# Patient Record
Sex: Female | Born: 1975 | Hispanic: No | Marital: Single | Smoking: Never smoker
Health system: Southern US, Community
[De-identification: ages and names within clinical notes are randomized; demographics above are authoritative.]

## PROBLEM LIST (undated history)

## (undated) ENCOUNTER — Inpatient Hospital Stay (HOSPITAL_COMMUNITY): Payer: Self-pay

## (undated) DIAGNOSIS — R42 Dizziness and giddiness: Secondary | ICD-10-CM

## (undated) DIAGNOSIS — F32A Depression, unspecified: Secondary | ICD-10-CM

## (undated) DIAGNOSIS — K5900 Constipation, unspecified: Secondary | ICD-10-CM

## (undated) DIAGNOSIS — E119 Type 2 diabetes mellitus without complications: Secondary | ICD-10-CM

## (undated) DIAGNOSIS — R011 Cardiac murmur, unspecified: Secondary | ICD-10-CM

## (undated) DIAGNOSIS — Z8042 Family history of malignant neoplasm of prostate: Secondary | ICD-10-CM

## (undated) DIAGNOSIS — F4310 Post-traumatic stress disorder, unspecified: Secondary | ICD-10-CM

## (undated) DIAGNOSIS — D649 Anemia, unspecified: Secondary | ICD-10-CM

## (undated) DIAGNOSIS — Z803 Family history of malignant neoplasm of breast: Secondary | ICD-10-CM

## (undated) DIAGNOSIS — K219 Gastro-esophageal reflux disease without esophagitis: Secondary | ICD-10-CM

## (undated) DIAGNOSIS — E7800 Pure hypercholesterolemia, unspecified: Secondary | ICD-10-CM

## (undated) DIAGNOSIS — I10 Essential (primary) hypertension: Secondary | ICD-10-CM

## (undated) DIAGNOSIS — T7840XA Allergy, unspecified, initial encounter: Secondary | ICD-10-CM

## (undated) HISTORY — DX: Family history of malignant neoplasm of breast: Z80.3

## (undated) HISTORY — DX: Gastro-esophageal reflux disease without esophagitis: K21.9

## (undated) HISTORY — DX: Family history of malignant neoplasm of prostate: Z80.42

## (undated) HISTORY — DX: Constipation, unspecified: K59.00

## (undated) HISTORY — DX: Allergy, unspecified, initial encounter: T78.40XA

## (undated) HISTORY — DX: Post-traumatic stress disorder, unspecified: F43.10

## (undated) HISTORY — DX: Depression, unspecified: F32.A

## (undated) HISTORY — DX: Type 2 diabetes mellitus without complications: E11.9

## (undated) HISTORY — PX: NO PAST SURGERIES: SHX2092

## (undated) HISTORY — DX: Anemia, unspecified: D64.9

---

## 2007-05-23 ENCOUNTER — Ambulatory Visit: Payer: Self-pay | Admitting: Obstetrics and Gynecology

## 2007-05-23 ENCOUNTER — Inpatient Hospital Stay (HOSPITAL_COMMUNITY): Admission: AD | Admit: 2007-05-23 | Discharge: 2007-05-24 | Payer: Self-pay | Admitting: Obstetrics and Gynecology

## 2007-06-27 ENCOUNTER — Emergency Department (HOSPITAL_COMMUNITY): Admission: EM | Admit: 2007-06-27 | Discharge: 2007-06-27 | Payer: Self-pay | Admitting: Emergency Medicine

## 2014-04-26 ENCOUNTER — Emergency Department (HOSPITAL_COMMUNITY)
Admission: EM | Admit: 2014-04-26 | Discharge: 2014-04-26 | Disposition: A | Discharge status: Home / Self Care | Payer: Medicaid - Out of State | Attending: Emergency Medicine | Admitting: Emergency Medicine

## 2014-04-26 ENCOUNTER — Encounter (HOSPITAL_COMMUNITY): Payer: Self-pay

## 2014-04-26 DIAGNOSIS — Z91040 Latex allergy status: Secondary | ICD-10-CM | POA: Diagnosis not present

## 2014-04-26 DIAGNOSIS — I10 Essential (primary) hypertension: Secondary | ICD-10-CM | POA: Insufficient documentation

## 2014-04-26 DIAGNOSIS — E780 Pure hypercholesterolemia: Secondary | ICD-10-CM | POA: Insufficient documentation

## 2014-04-26 DIAGNOSIS — R011 Cardiac murmur, unspecified: Secondary | ICD-10-CM | POA: Insufficient documentation

## 2014-04-26 DIAGNOSIS — Z760 Encounter for issue of repeat prescription: Secondary | ICD-10-CM | POA: Diagnosis not present

## 2014-04-26 DIAGNOSIS — R42 Dizziness and giddiness: Secondary | ICD-10-CM | POA: Diagnosis not present

## 2014-04-26 DIAGNOSIS — Z79899 Other long term (current) drug therapy: Secondary | ICD-10-CM | POA: Insufficient documentation

## 2014-04-26 HISTORY — DX: Cardiac murmur, unspecified: R01.1

## 2014-04-26 HISTORY — DX: Dizziness and giddiness: R42

## 2014-04-26 HISTORY — DX: Essential (primary) hypertension: I10

## 2014-04-26 HISTORY — DX: Pure hypercholesterolemia, unspecified: E78.00

## 2014-04-26 MED ORDER — AMLODIPINE BESYLATE 5 MG PO TABS: 5.0000 mg | ORAL_TABLET | Freq: Every day | ORAL | Status: DC

## 2014-04-26 MED ORDER — MECLIZINE HCL 25 MG PO TABS
25.0000 mg | ORAL_TABLET | Freq: Once | ORAL | Status: AC
  Administered 2014-04-26: 25 mg via ORAL
  Filled 2014-04-26: qty 1

## 2014-04-26 MED ORDER — MECLIZINE HCL 25 MG PO TABS: 25.0000 mg | ORAL_TABLET | Freq: Three times a day (TID) | ORAL | Status: DC | PRN

## 2014-04-26 NOTE — ED Notes (Signed)
Wanda from [facility] at bedside.

## 2014-04-26 NOTE — ED Provider Notes (Signed)
CSN: [id number]     Arrival date & time 04/26/14  1241 History   First MD Initiated Contact with Patient 04/26/14 1305     Chief Complaint  Patient presents with  . Dizziness     (Consider location/radiation/quality/duration/timing/severity/associated sxs/prior Treatment) HPI Pt is a 39yo female with hx of vertigo, HTN, elevated cholesterol and heart murmur, presenting to ED with c/o an episode of vertigo with dizziness described as room spinning, mild generalized headache, and associated nausea that lasted about 30-40 minutes.  Pt states symptoms started all of a sudden and gradually improved with rest but states she normally has vertigo when her BP is high. Symptoms today felt similar to previous episodes of vertigo.  She does not have meclizine at home for vertigo.  Pt states she moved from [place] on 03/12/14, ran out of her BP medication 3 days ago, norvasc "lowest dose" once daily.  Pt states upon arrival to ED dizziness and headache have subsided but still c/o mild lightheadedness.  No fever, chills, vomiting, diarrhea, chest pain or SOB. Denies numbness or weakness in arms or legs.   Past Medical History  Diagnosis Date  . Vertigo   . Hypertension   . Elevated cholesterol   . Heart murmur    History reviewed. No pertinent past surgical history. History reviewed. No pertinent family history. History  Substance Use Topics  . Smoking status: Never Smoker   . Smokeless tobacco: Not on file  . Alcohol Use: No   OB History    No data available     Review of Systems  Constitutional: Negative for fever, chills, diaphoresis, appetite change and fatigue.  Respiratory: Negative for cough and shortness of breath.   Cardiovascular: Negative for chest pain and palpitations.  Gastrointestinal: Positive for nausea. Negative for vomiting, abdominal pain, diarrhea and constipation.  Neurological: Positive for dizziness, light-headedness and headaches. Negative for tremors, seizures,  syncope, facial asymmetry, speech difficulty, weakness and numbness.  All other systems reviewed and are negative.     Allergies  Latex  Home Medications   Prior to Admission medications   Medication Sig Start Date End Date Taking? Authorizing Provider  atorvastatin (LIPITOR) 10 MG tablet Take 10 mg by mouth daily.   Yes Historical Provider, MD  amLODipine (NORVASC) 5 MG tablet Take 1 tablet (5 mg total) by mouth daily. 04/26/14   Noland Fordyce, PA-C  meclizine (ANTIVERT) 25 MG tablet Take 1 tablet (25 mg total) by mouth 3 (three) times daily as needed for dizziness. 04/26/14   Noland Fordyce, PA-C   BP 126/71 mmHg  Pulse 59  Temp(Src) 98.5 F (36.9 C) (Oral)  Resp 17  Ht 5\' 9"  (1.753 m)  Wt 204 lb (92.534 kg)  BMI 30.11 kg/m2  SpO2 100%  LMP 04/03/2014 Physical Exam  Constitutional: She is oriented to person, place, and time. She appears well-developed and well-nourished. No distress.  Pt lying comfortably in exam bed watching television, NAD.  HENT:  Head: Normocephalic and atraumatic.  Eyes: Conjunctivae and EOM are normal. Pupils are equal, round, and reactive to light. No scleral icterus.  Neck: Normal range of motion. Neck supple.  Cardiovascular: Normal rate, regular rhythm and normal heart sounds.   Pulmonary/Chest: Effort normal and breath sounds normal. No respiratory distress. She has no wheezes. She has no rales. She exhibits no tenderness.  Abdominal: Soft. Bowel sounds are normal. She exhibits no distension and no mass. There is no tenderness. There is no rebound and no guarding.  Musculoskeletal:  Normal range of motion.  Neurological: She is alert and oriented to person, place, and time. She has normal strength. No cranial nerve deficit or sensory deficit. Coordination and gait normal. GCS eye subscore is 4. GCS verbal subscore is 5. GCS motor subscore is 6.  Skin: Skin is warm and dry. She is not diaphoretic.  Nursing note and vitals reviewed.   ED Course   Procedures (including critical care time) Labs Review Labs Reviewed - No data to display  Imaging Review No results found.   EKG Interpretation None      MDM   Final diagnoses:  Vertigo  Medication refill    Pt is a 39yo female with hx of vertigo and HTN, presenting to ED with c/o episode of vertigo lasting 30-40 minutes. Pt has been off BP medication for 3 days due to recent move and does not have a PCP at this time. Pt c/o mild lightheadedness, but symptoms have otherwise resolved. Doubt CVA, SAH, or other emergent process taking place at this time.  Will get orthostatic vital signs. BP was WNL upon arrival to ED.  Meclizine given in ED.  Case Management consulted to help pt establish care with a PCP for ongoing healthcare needs including medication refills.  Pt states she does not need a refill on her cholesterol medication. Will prescribe 1 month of norvasc but strongly encouraged pt to f/u with [facility] for PCP. [employer] guide provided. Return precautions provided. Pt verbalized understanding and agreement with tx plan.     Noland Fordyce, PA-C 04/26/14 [place], MD 04/28/14 [phone]

## 2014-04-26 NOTE — ED Notes (Signed)
Pt moved here 03-12-14 from [place].  Has been out of BP meds x 3 days.  Pt had vertigo this morning that lasted 45 minutes.  Pt reports that when she has this BP is usually high.  Pt feels "little lightheaded" now.  No other s/s noted.

## 2014-04-26 NOTE — Discharge Instructions (Signed)
Benign Positional Vertigo Vertigo means you feel like you or your surroundings are moving when they are not. Benign positional vertigo is the most common form of vertigo. Benign means that the cause of your condition is not serious. Benign positional vertigo is more common in older adults. CAUSES  Benign positional vertigo is the result of an upset in the labyrinth system. This is an area in the middle ear that helps control your balance. This may be caused by a viral infection, head injury, or repetitive motion. However, often no specific cause is found. SYMPTOMS  Symptoms of benign positional vertigo occur when you move your head or eyes in different directions. Some of the symptoms may include:  Loss of balance and falls.  Vomiting.  Blurred vision.  Dizziness.  Nausea.  Involuntary eye movements (nystagmus). DIAGNOSIS  Benign positional vertigo is usually diagnosed by physical exam. If the specific cause of your benign positional vertigo is unknown, your caregiver may perform imaging tests, such as magnetic resonance imaging (MRI) or computed tomography (CT). TREATMENT  Your caregiver may recommend movements or procedures to correct the benign positional vertigo. Medicines such as meclizine, benzodiazepines, and medicines for nausea may be used to treat your symptoms. In rare cases, if your symptoms are caused by certain conditions that affect the inner ear, you may need surgery. HOME CARE INSTRUCTIONS   Follow your caregiver's instructions.  Move slowly. Do not make sudden body or head movements.  Avoid driving.  Avoid operating heavy machinery.  Avoid performing any tasks that would be dangerous to you or others during a vertigo episode.  Drink enough fluids to keep your urine clear or pale yellow. SEEK IMMEDIATE MEDICAL CARE IF:   You develop problems with walking, weakness, numbness, or using your arms, hands, or legs.  You have difficulty speaking.  You develop  severe headaches.  Your nausea or vomiting continues or gets worse.  You develop visual changes.  Your family or friends notice any behavioral changes.  Your condition gets worse.  You have a fever.  You develop a stiff neck or sensitivity to light. MAKE SURE YOU:   Understand these instructions.  Will watch your condition.  Will get help right away if you are not doing well or get worse. Document Released: 10/04/2005 Document Revised: 03/21/2011 Document Reviewed: 09/16/2010 [employer] Patient Information 2015 [employer]. This information is not intended to replace advice given to you by your health care provider. Make sure you discuss any questions you have with your health care provider.    

## 2014-06-25 ENCOUNTER — Ambulatory Visit: Payer: PRIVATE HEALTH INSURANCE

## 2014-09-23 ENCOUNTER — Emergency Department (HOSPITAL_COMMUNITY)
Admission: EM | Admit: 2014-09-23 | Discharge: 2014-09-23 | Disposition: A | Discharge status: Home / Self Care | Payer: Medicaid Other | Attending: Emergency Medicine | Admitting: Emergency Medicine

## 2014-09-23 ENCOUNTER — Emergency Department (HOSPITAL_COMMUNITY): Payer: Medicaid Other

## 2014-09-23 ENCOUNTER — Encounter (HOSPITAL_COMMUNITY): Payer: Self-pay | Admitting: Emergency Medicine

## 2014-09-23 DIAGNOSIS — I10 Essential (primary) hypertension: Secondary | ICD-10-CM | POA: Insufficient documentation

## 2014-09-23 DIAGNOSIS — O99411 Diseases of the circulatory system complicating pregnancy, first trimester: Secondary | ICD-10-CM | POA: Diagnosis not present

## 2014-09-23 DIAGNOSIS — R011 Cardiac murmur, unspecified: Secondary | ICD-10-CM | POA: Diagnosis not present

## 2014-09-23 DIAGNOSIS — Z91040 Latex allergy status: Secondary | ICD-10-CM | POA: Diagnosis not present

## 2014-09-23 DIAGNOSIS — Z3A08 8 weeks gestation of pregnancy: Secondary | ICD-10-CM | POA: Insufficient documentation

## 2014-09-23 DIAGNOSIS — R109 Unspecified abdominal pain: Secondary | ICD-10-CM | POA: Insufficient documentation

## 2014-09-23 DIAGNOSIS — Z8659 Personal history of other mental and behavioral disorders: Secondary | ICD-10-CM | POA: Insufficient documentation

## 2014-09-23 DIAGNOSIS — N939 Abnormal uterine and vaginal bleeding, unspecified: Secondary | ICD-10-CM

## 2014-09-23 DIAGNOSIS — O209 Hemorrhage in early pregnancy, unspecified: Secondary | ICD-10-CM | POA: Diagnosis present

## 2014-09-23 DIAGNOSIS — O9989 Other specified diseases and conditions complicating pregnancy, childbirth and the puerperium: Secondary | ICD-10-CM | POA: Insufficient documentation

## 2014-09-23 LAB — WET PREP, GENITAL
TRICH WET PREP: NONE SEEN
Yeast Wet Prep HPF POC: NONE SEEN

## 2014-09-23 LAB — COMPREHENSIVE METABOLIC PANEL
ALT: 11 U/L — AB (ref 14–54)
AST: 17 U/L (ref 15–41)
Albumin: 3.6 g/dL (ref 3.5–5.0)
Alkaline Phosphatase: 49 U/L (ref 38–126)
Anion gap: 10 (ref 5–15)
BILIRUBIN TOTAL: 0.5 mg/dL (ref 0.3–1.2)
BUN: 8 mg/dL (ref 6–20)
CHLORIDE: 103 mmol/L (ref 101–111)
CO2: 22 mmol/L (ref 22–32)
CREATININE: 0.71 mg/dL (ref 0.44–1.00)
Calcium: 9.2 mg/dL (ref 8.9–10.3)
Glucose, Bld: 112 mg/dL — ABNORMAL HIGH (ref 65–99)
Potassium: 3.7 mmol/L (ref 3.5–5.1)
Sodium: 135 mmol/L (ref 135–145)
TOTAL PROTEIN: 6.4 g/dL — AB (ref 6.5–8.1)

## 2014-09-23 LAB — CBC WITH DIFFERENTIAL/PLATELET
BASOS ABS: 0 10*3/uL (ref 0.0–0.1)
Basophils Relative: 0 % (ref 0–1)
EOS PCT: 3 % (ref 0–5)
Eosinophils Absolute: 0.3 10*3/uL (ref 0.0–0.7)
HEMATOCRIT: 35.5 % — AB (ref 36.0–46.0)
Hemoglobin: 12.2 g/dL (ref 12.0–15.0)
LYMPHS ABS: 4.2 10*3/uL — AB (ref 0.7–4.0)
LYMPHS PCT: 46 % (ref 12–46)
MCH: 29.1 pg (ref 26.0–34.0)
MCHC: 34.4 g/dL (ref 30.0–36.0)
MCV: 84.7 fL (ref 78.0–100.0)
MONO ABS: 0.6 10*3/uL (ref 0.1–1.0)
Monocytes Relative: 7 % (ref 3–12)
NEUTROS ABS: 4 10*3/uL (ref 1.7–7.7)
Neutrophils Relative %: 44 % (ref 43–77)
PLATELETS: 207 10*3/uL (ref 150–400)
RBC: 4.19 MIL/uL (ref 3.87–5.11)
RDW: 13.4 % (ref 11.5–15.5)
WBC: 9.1 10*3/uL (ref 4.0–10.5)

## 2014-09-23 LAB — URINALYSIS, ROUTINE W REFLEX MICROSCOPIC
BILIRUBIN URINE: NEGATIVE
GLUCOSE, UA: NEGATIVE mg/dL
KETONES UR: NEGATIVE mg/dL
Leukocytes, UA: NEGATIVE
Nitrite: NEGATIVE
PROTEIN: NEGATIVE mg/dL
Specific Gravity, Urine: 1.003 — ABNORMAL LOW (ref 1.005–1.030)
UROBILINOGEN UA: 0.2 mg/dL (ref 0.0–1.0)
pH: 6.5 (ref 5.0–8.0)

## 2014-09-23 LAB — URINE MICROSCOPIC-ADD ON

## 2014-09-23 LAB — GC/CHLAMYDIA PROBE AMP (~~LOC~~) NOT AT ARMC
CHLAMYDIA, DNA PROBE: NEGATIVE
NEISSERIA GONORRHEA: NEGATIVE

## 2014-09-23 LAB — POC URINE PREG, ED: PREG TEST UR: POSITIVE — AB

## 2014-09-23 NOTE — ED Notes (Signed)
Patient transported to Ultrasound 

## 2014-09-23 NOTE — ED Notes (Signed)
Pt is pregnant and reports bleeding x 2 episodes today and on the 8th. No pain noted.

## 2014-09-23 NOTE — ED Provider Notes (Signed)
CSN: [id number]     Arrival date & time 09/23/14  0057 History   ,This chart was scribed for Everlene Balls, MD by Randa Evens, ED Scribe. This patient was seen in room A04C/A04C and the patient's care was started at 2:38 AM.     Chief Complaint  Patient presents with  . Vaginal Bleeding   Patient is a 39 y.o. female presenting with vaginal bleeding. The history is provided by the patient. No language interpreter was used.  Vaginal Bleeding Quality:  Spotting Severity:  Moderate Chronicity:  New Possible pregnancy: yes   Relieved by:  None tried Worsened by:  Nothing tried Ineffective treatments:  None tried Associated symptoms: abdominal pain   Associated symptoms: no fever, no nausea and no vaginal discharge    HPI Comments: Alicia Obrien is a 39 y.o. female who presents to the Emergency Department complaining of vaginal bleeding onset today. Pt states she she is pregnant but is unsure how far along. Pt report similar bleeding 5 days prior that resolved on its own. She states that her LMP was in July. Pt report associated abdominal pain. Pt denies dysuria or other related symptoms. Pt states that she does not have an OBGYN to follow up with this time.   Past Medical History  Diagnosis Date  . Vertigo   . Hypertension   . Elevated cholesterol   . Heart murmur    History reviewed. No pertinent past surgical history. No family history on file. Social History  Substance Use Topics  . Smoking status: Never Smoker   . Smokeless tobacco: None  . Alcohol Use: No   OB History    No data available      Review of Systems  Constitutional: Negative for fever.  Gastrointestinal: Positive for abdominal pain. Negative for nausea and vomiting.  Genitourinary: Positive for vaginal bleeding. Negative for vaginal discharge.  All other systems reviewed and are negative.    Allergies  Latex  Home Medications   Prior to Admission medications   Medication Sig Start Date End Date  Taking? Authorizing Provider  amLODipine (NORVASC) 5 MG tablet Take 1 tablet (5 mg total) by mouth daily. Patient not taking: Reported on 09/23/2014 04/26/14   Noland Fordyce, PA-C  meclizine (ANTIVERT) 25 MG tablet Take 1 tablet (25 mg total) by mouth 3 (three) times daily as needed for dizziness. Patient not taking: Reported on 09/23/2014 04/26/14   Noland Fordyce, PA-C   BP 130/81 mmHg  Pulse 84  Temp(Src) 98.5 F (36.9 C) (Oral)  Resp 16  SpO2 99%  LMP 07/23/2014   Physical Exam  Constitutional: She is oriented to person, place, and time. She appears well-developed and well-nourished. No distress.  HENT:  Head: Normocephalic and atraumatic.  Nose: Nose normal.  Mouth/Throat: Oropharynx is clear and moist. No oropharyngeal exudate.  Eyes: Conjunctivae and EOM are normal. Pupils are equal, round, and reactive to light. No scleral icterus.  Neck: Normal range of motion. Neck supple. No JVD present. No tracheal deviation present. No thyromegaly present.  Cardiovascular: Normal rate, regular rhythm and normal heart sounds.  Exam reveals no gallop and no friction rub.   No murmur heard. Pulmonary/Chest: Effort normal and breath sounds normal. No respiratory distress. She has no wheezes. She exhibits no tenderness.  Abdominal: Soft. Bowel sounds are normal. She exhibits no distension and no mass. There is no tenderness. There is no rebound and no guarding.  Musculoskeletal: Normal range of motion. She exhibits no edema or tenderness.  Lymphadenopathy:  She has no cervical adenopathy.  Neurological: She is alert and oriented to person, place, and time. No cranial nerve deficit. She exhibits normal muscle tone.  Skin: Skin is warm and dry. No rash noted. No erythema. No pallor.  Nursing note and vitals reviewed.   ED Course  Procedures (including critical care time) DIAGNOSTIC STUDIES: Oxygen Saturation is 99% on RA, normal by my interpretation.    COORDINATION OF CARE: 2:42  AM-Discussed treatment plan with pt at bedside and pt agreed to plan.     Labs Review Labs Reviewed  WET PREP, GENITAL - Abnormal; Notable for the following:    Clue Cells Wet Prep HPF POC MANY (*)    WBC, Wet Prep HPF POC MODERATE (*)    All other components within normal limits  CBC WITH DIFFERENTIAL/PLATELET - Abnormal; Notable for the following:    HCT 35.5 (*)    Lymphs Abs 4.2 (*)    All other components within normal limits  COMPREHENSIVE METABOLIC PANEL - Abnormal; Notable for the following:    Glucose, Bld 112 (*)    Total Protein 6.4 (*)    ALT 11 (*)    All other components within normal limits  URINALYSIS, ROUTINE W REFLEX MICROSCOPIC (NOT AT [facility]) - Abnormal; Notable for the following:    Specific Gravity, Urine 1.003 (*)    Hgb urine dipstick TRACE (*)    All other components within normal limits  POC URINE PREG, ED - Abnormal; Notable for the following:    Preg Test, Ur POSITIVE (*)    All other components within normal limits  URINE MICROSCOPIC-ADD ON  GC/CHLAMYDIA PROBE AMP ([place]) NOT AT [facility]    Imaging Review US Ob Comp Less 14 Wks  09/23/2014   CLINICAL DATA:  39 year old pregnant female with vaginal bleeding  EXAM: OBSTETRIC <14 WK [place] AND TRANSVAGINAL OB US  TECHNIQUE: Both transabdominal and transvaginal ultrasound examinations were performed for complete evaluation of the gestation as well as the maternal uterus, adnexal regions, and pelvic cul-de-sac. Transvaginal technique was performed to assess early pregnancy.  COMPARISON:  None.  FINDINGS: There is a cystic structure within the endometrial canal. No definite yolk sac or a fetal pole identified within this structure. A curvilinear echogenic band within this cystic structure may represent a developing yolk sac. This cystic structure may represent an early gestational sac, a blighted ovum, and endometrial cyst. A pseudo gestation of an ectopic pregnancy is not entirely excluded. Correlation with serial  HCG levels and follow-up with ultrasound is recommended.  If this cystic structure is a true gestational sac the estimated gestational age based on mean sac diameter of 12 mm is 6 weeks, 0 days.  The right ovary measures 2.9 x 2.0 x 1.7 cm. A 1.6 x 1.0 cm corpus luteum is seen in the right ovary. The left ovary measures 2.4 x 1.2 x 2.2 cm CT appears unremarkable.  No significant free fluid noted within the pelvis.  IMPRESSION: Cystic structure within the endometrial canal as described. Correlation with serial HCG levels and follow-up with ultrasound is recommended.   Electronically Signed   By: Anner Crete M.D.   On: 09/23/2014 03:53   US Ob Transvaginal  09/23/2014   CLINICAL DATA:  39 year old pregnant female with vaginal bleeding  EXAM: OBSTETRIC <14 WK [place] AND TRANSVAGINAL OB US  TECHNIQUE: Both transabdominal and transvaginal ultrasound examinations were performed for complete evaluation of the gestation as well as the maternal uterus, adnexal regions, and pelvic  cul-de-sac. Transvaginal technique was performed to assess early pregnancy.  COMPARISON:  None.  FINDINGS: There is a cystic structure within the endometrial canal. No definite yolk sac or a fetal pole identified within this structure. A curvilinear echogenic band within this cystic structure may represent a developing yolk sac. This cystic structure may represent an early gestational sac, a blighted ovum, and endometrial cyst. A pseudo gestation of an ectopic pregnancy is not entirely excluded. Correlation with serial HCG levels and follow-up with ultrasound is recommended.  If this cystic structure is a true gestational sac the estimated gestational age based on mean sac diameter of 12 mm is 6 weeks, 0 days.  The right ovary measures 2.9 x 2.0 x 1.7 cm. A 1.6 x 1.0 cm corpus luteum is seen in the right ovary. The left ovary measures 2.4 x 1.2 x 2.2 cm CT appears unremarkable.  No significant free fluid noted within the pelvis.  IMPRESSION:  Cystic structure within the endometrial canal as described. Correlation with serial HCG levels and follow-up with ultrasound is recommended.   Electronically Signed   By: Anner Crete M.D.   On: 09/23/2014 03:53      EKG Interpretation None      MDM   Final diagnoses:  None   Patient presents to emergency department for vaginal bleeding. This is in the setting of being pregnant. She states her last initial cycle was mid July. My exam does not reveal any blood but I do see a brownish yellow discharge concerning for miscarriage.   Transvaginal ultrasound reveals a cystic structure. Differential includes a blighted ovum, endometriosis, or early gestational sac. Patient should be approximately [redacted] weeks pregnant, the sac is measuring possibly [redacted] weeks pregnant. This is likely a abortion however patient will require follow-up and repeat ultrasound for continued management. She is advised on OB follow-up, she can return to the ED if she cannot get an appointment. She otherwise appears well and in no acute distress. Her vital signs remain within her normal limits and she is safe for discharge.  I personally performed the services described in this documentation, which was scribed in my presence. The recorded information has been reviewed and is accurate.    Everlene Balls, MD 09/23/14 [phone]

## 2014-09-23 NOTE — ED Notes (Signed)
Pt. reports vaginal bleeding today with low abdominal cramping , denies emesis or diarrhea , no fever or chills.

## 2014-09-23 NOTE — ED Notes (Signed)
Patient returned from US.

## 2014-09-23 NOTE — Discharge Instructions (Signed)
Vaginal Bleeding During Pregnancy, First Trimester Alicia Obrien, your ultrasound results are below. You need to see an OB/GYN within 3 days for close follow-up and repeat testing. If you cannot get an appointment come back to the emergency department for repeat testing. It is possible that you still have a live baby in your uterus, however the ultrasound also shows a possibility of this being a miscarriage.  For any worsening worsening come back to emergency department immediately. Thank you.   FINDINGS: There is a cystic structure within the endometrial canal. No definite yolk sac or a fetal pole identified within this structure. A curvilinear echogenic band within this cystic structure may represent a developing yolk sac. This cystic structure may represent an early gestational sac, a blighted ovum, and endometrial cyst. A pseudo gestation of an ectopic pregnancy is not entirely excluded. Correlation with serial HCG levels and follow-up with ultrasound is recommended.  If this cystic structure is a true gestational sac the estimated gestational age based on mean sac diameter of 12 mm is 6 weeks, 0 days.   A small amount of bleeding (spotting) from the vagina is common in early pregnancy. Sometimes the bleeding is normal and is not a problem, and sometimes it is a sign of something serious. Be sure to tell your doctor about any bleeding from your vagina right away. HOME CARE  Watch your condition for any changes.  Follow your doctor's instructions about how active you can be.  If you are on bed rest:  You may need to stay in bed and only get up to use the bathroom.  You may be allowed to do some activities.  If you need help, make plans for someone to help you.  Write down:  The number of pads you use each day.  How often you change pads.  How soaked (saturated) your pads are.  Do not use tampons.  Do not douche.  Do not have sex or orgasms until your doctor says it is  okay.  If you pass any tissue from your vagina, save the tissue so you can show it to your doctor.  Only take medicines as told by your doctor.  Do not take aspirin because it can make you bleed.  Keep all follow-up visits as told by your doctor. GET HELP IF:   You bleed from your vagina.  You have cramps.  You have labor pains.  You have a fever that does not go away after you take medicine. GET HELP RIGHT AWAY IF:   You have very bad cramps in your back or belly (abdomen).  You pass large clots or tissue from your vagina.  You bleed more.  You feel light-headed or weak.  You pass out (faint).  You have chills.  You are leaking fluid or have a gush of fluid from your vagina.  You pass out while pooping (having a bowel movement). MAKE SURE YOU:  Understand these instructions.  Will watch your condition.  Will get help right away if you are not doing well or get worse. Document Released: 05/13/2013 Document Reviewed: 09/03/2012 [facility] Patient Information 2015 [employer]. This information is not intended to replace advice given to you by your health care provider. Make sure you discuss any questions you have with your health care provider.

## 2014-09-25 ENCOUNTER — Other Ambulatory Visit: Payer: Self-pay

## 2014-09-25 DIAGNOSIS — O209 Hemorrhage in early pregnancy, unspecified: Secondary | ICD-10-CM

## 2014-09-26 LAB — HCG, QUANTITATIVE, PREGNANCY: HCG, BETA CHAIN, QUANT, S: 2230.9 m[IU]/mL

## 2014-09-27 ENCOUNTER — Inpatient Hospital Stay (HOSPITAL_COMMUNITY)
Admission: AD | Admit: 2014-09-27 | Discharge: 2014-09-27 | Disposition: A | Discharge status: Home / Self Care | Payer: Medicaid Other | Source: Ambulatory Visit | Attending: Obstetrics & Gynecology | Admitting: Obstetrics & Gynecology

## 2014-09-27 ENCOUNTER — Inpatient Hospital Stay (HOSPITAL_COMMUNITY): Payer: Self-pay

## 2014-09-27 ENCOUNTER — Encounter (HOSPITAL_COMMUNITY): Payer: Self-pay

## 2014-09-27 DIAGNOSIS — O209 Hemorrhage in early pregnancy, unspecified: Secondary | ICD-10-CM | POA: Insufficient documentation

## 2014-09-27 DIAGNOSIS — R109 Unspecified abdominal pain: Secondary | ICD-10-CM | POA: Insufficient documentation

## 2014-09-27 DIAGNOSIS — Z3A09 9 weeks gestation of pregnancy: Secondary | ICD-10-CM | POA: Insufficient documentation

## 2014-09-27 DIAGNOSIS — O039 Complete or unspecified spontaneous abortion without complication: Secondary | ICD-10-CM | POA: Diagnosis not present

## 2014-09-27 LAB — CBC
HCT: 34.8 % — ABNORMAL LOW (ref 36.0–46.0)
HEMOGLOBIN: 11.4 g/dL — AB (ref 12.0–15.0)
MCH: 27.5 pg (ref 26.0–34.0)
MCHC: 32.8 g/dL (ref 30.0–36.0)
MCV: 84.1 fL (ref 78.0–100.0)
Platelets: 190 10*3/uL (ref 150–400)
RBC: 4.14 MIL/uL (ref 3.87–5.11)
RDW: 13.7 % (ref 11.5–15.5)
WBC: 9.5 10*3/uL (ref 4.0–10.5)

## 2014-09-27 LAB — ABO/RH: ABO/RH(D): B POS

## 2014-09-27 LAB — HCG, QUANTITATIVE, PREGNANCY: HCG, BETA CHAIN, QUANT, S: 542 m[IU]/mL — AB (ref <5)

## 2014-09-27 MED ORDER — HYDROCODONE-ACETAMINOPHEN 5-325 MG PO TABS
2.0000 | ORAL_TABLET | Freq: Once | ORAL | Status: AC
  Administered 2014-09-27: 2 via ORAL
  Filled 2014-09-27: qty 2

## 2014-09-27 MED ORDER — MISOPROSTOL 200 MCG PO TABS: 800.0000 ug | ORAL_TABLET | Freq: Once | ORAL | Status: DC

## 2014-09-27 NOTE — MAU Provider Note (Signed)
History     CSN: [id number]  Arrival date and time: 09/27/14 3009   First Provider Initiated Contact with Patient 09/27/14 1018       Chief Complaint  Patient presents with  . Vaginal Bleeding   HPI  Alicia Obrien is a 39 y.o. G4P3 at [redacted]w[redacted]d who presents for abdominal pain & vaginal bleeding.  Was seen in ED on 9/13; ultrasound showed possible gestational sac.  Had BHCG at [facility] on 9/15; 2230.  Abdominal pain & bleeding has increased in the last 2 days. States she has to change pads every hour. Has noticed a few large clots.  Rates pain 10/10, cramp-like, intermittent. Has not taken anything for pain.  Denies chest pain, shortness of breath, or dizziness.  Denies fever.   OB History    Gravida Para Term Preterm AB TAB SAB Ectopic Multiple Living   4 3        3       Past Medical History  Diagnosis Date  . Vertigo   . Hypertension   . Elevated cholesterol   . Heart murmur     History reviewed. No pertinent past surgical history.  No family history on file.  Social History  Substance Use Topics  . Smoking status: Never Smoker   . Smokeless tobacco: None  . Alcohol Use: No    Allergies:  Allergies  Allergen Reactions  . Latex Rash    Prescriptions prior to admission  Medication Sig Dispense Refill Last Dose  . amLODipine (NORVASC) 5 MG tablet Take 1 tablet (5 mg total) by mouth daily. (Patient not taking: Reported on 09/23/2014) 30 tablet 0 Not Taking at Unknown time  . meclizine (ANTIVERT) 25 MG tablet Take 1 tablet (25 mg total) by mouth 3 (three) times daily as needed for dizziness. (Patient not taking: Reported on 09/23/2014) 30 tablet 0 Not Taking at Unknown time    Review of Systems  Constitutional: Negative for fever and chills.  Respiratory: Negative for shortness of breath.   Cardiovascular: Negative for chest pain.  Gastrointestinal: Positive for nausea and abdominal pain. Negative for vomiting, diarrhea and constipation.  Genitourinary: Negative  for dysuria.       + vaginal bleeding  Neurological: Negative for dizziness, weakness and headaches.   Physical Exam   Blood pressure 131/77, pulse 77, temperature 98.3 F (36.8 C), temperature source Oral, resp. rate 16, height 5\' 9"  (1.753 m), weight 96.798 kg (213 lb 6.4 oz), last menstrual period 07/23/2014.  Physical Exam  Nursing note and vitals reviewed. Constitutional: She is oriented to person, place, and time. She appears well-developed and well-nourished. She appears distressed (pt tearful & visibly upset).  HENT:  Head: Normocephalic and atraumatic.  Eyes: Conjunctivae are normal. Right eye exhibits no discharge. Left eye exhibits no discharge. No scleral icterus.  Neck: Normal range of motion.  Cardiovascular: Normal rate, regular rhythm and normal heart sounds.   No murmur heard. Respiratory: Effort normal and breath sounds normal. No respiratory distress. She has no wheezes.  GI: Soft. Bowel sounds are normal. There is tenderness (lower abdomen).  Genitourinary: Vagina normal. Cervix exhibits no motion tenderness.  Moderate amount of dark red blood  Cervix closed  Neurological: She is alert and oriented to person, place, and time.  Skin: Skin is warm and dry. She is not diaphoretic.  Psychiatric: She has a normal mood and affect. Her behavior is normal. Judgment and thought content normal.    MAU Course  Procedures Results for orders placed or  performed during the hospital encounter of 09/27/14 (from the past 24 hour(s))  CBC     Status: Abnormal   Collection Time: 09/27/14 10:25 AM  Result Value Ref Range   WBC 9.5 4.0 - 10.5 K/uL   RBC 4.14 3.87 - 5.11 MIL/uL   Hemoglobin 11.4 (L) 12.0 - 15.0 g/dL   HCT 34.8 (L) 36.0 - 46.0 %   MCV 84.1 78.0 - 100.0 fL   MCH 27.5 26.0 - 34.0 pg   MCHC 32.8 30.0 - 36.0 g/dL   RDW 13.7 11.5 - 15.5 %   Platelets 190 150 - 400 K/uL  ABO/Rh     Status: None (Preliminary result)   Collection Time: 09/27/14 10:25 AM  Result  Value Ref Range   ABO/RH(D) B POS   hCG, quantitative, pregnancy     Status: Abnormal   Collection Time: 09/27/14 10:25 AM  Result Value Ref Range   hCG, Beta Chain, Quant, S 542 (H) <5 mIU/mL   US Ob Transvaginal  09/27/2014   CLINICAL DATA:  First trimester pregnancy, bleeding  EXAM: TRANSVAGINAL OB ULTRASOUND  TECHNIQUE: Transvaginal ultrasound was performed for complete evaluation of the gestation as well as the maternal uterus, adnexal regions, and pelvic cul-de-sac. A latex-free probe cover was used.  COMPARISON:  None.  FINDINGS: Intrauterine gestational sac: None visualized  Yolk sac:  Not seen  Embryo:  Not seen  Cardiac Activity: Not seen  Maternal uterus/adnexae: Right ovary is normal. Left ovary is not visualized. There is focally thickened area of endometrium in the lower uterine segment measuring 24 mm. It does not appear to be hypervascular. There is no free fluid.  IMPRESSION: Focally thickened endometriummay represent hemorrhage/clot. Polyp, hyperplasia, and carcinoma will need to be excluded by follow up. No evidence of intrauterine gestation; previous cystic endometrial structure is no longer present. Findings are suspicious but not yet definitive for failed pregnancy. Recommend follow-up US in 10-14 days for definitive diagnosis. This recommendation follows SRU consensus guidelines: Diagnostic Criteria for Nonviable Pregnancy Early in the First Trimester. [employer] Med 2013; 161:0960-45.   Electronically Signed   By: Skipper Cliche M.D.   On: 09/27/2014 11:29    MDM B Positive Hemoglobin stable HCG dropped from 2230 > 542 1212-C/w Dr. Gala Romney. Discussed ultrasound report & labs. Complete miscarriage. Offer cytotec.  Pt declines rx for pain medication but will have a dose while in MAU  Assessment and Plan  A: 1. Miscarriage   2. Vaginal bleeding in pregnancy, first trimester     P: Discharge home Rx cytotec for self placement Take ibuprofen PRN pain Discussed reasons to  return to MAU including fever, increased bleeding, or increased pain.  Schedule f/u in [place] care provided   Jorje Guild, NP  09/27/2014, 10:17 AM             Early Intrauterine Pregnancy Failure  _X_  Documented intrauterine pregnancy failure less than or equal to [redacted] weeks gestation  _X_  No serious current illness  _X_  Baseline Hgb greater than or equal to 10g/dl  _X_  Patient has easily accessible transportation to the hospital  _X_  Clear preference  _X_  Practitioner/physician deems patient reliable  _X_  Counseling by practitioner or physician  ___  Patient education by RN  ___  Consent form signed  _n/a__  Rho-Gam given by RN if indicated  _X__ Medication dispensed   _X__   Cytotec 800 mcg  __   Intravaginally by patient  at home         __   Intravaginally by RN in MAU        _X_   Rectally by patient at home        __   Rectally by RN in MAU  ___  Ibuprofen 600 mg 1 tablet by mouth every 6 hours as needed #30  ___  Hydrocodone/acetaminophen 5/325 mg by mouth every 4 to 6 hours as needed  ___  Phenergan 12.5 mg by mouth every 4 hours as needed for nausea

## 2014-09-27 NOTE — Discharge Instructions (Signed)
FACTS YOU SHOULD KNOW  WHAT IS AN EARLY PREGNANCY FAILURE? Once the egg is fertilized with the sperm and begins to develop, it attaches to the lining of the uterus. This early pregnancy tissue may not develop into an embryo (the beginning stage of a baby). Sometimes an embryo does develop but does not continue to grow. These problems can be seen on ultrasound.   MANAGEMNT OF EARLY PREGNANCY FAILURE: About 4 out of 100 (0.25%) women will have a pregnancy loss in her lifetime.  One in five pregnancies is found to be an early pregnancy failure.  There are 3 ways to care for an early pregnancy failure:   (1) Surgery, (2) Medicine, (3) Waiting for you to pass the pregnancy on your own. The decision as to how to proceed after being diagnosed with and early pregnancy failure is an individual one.  The decision can be made only after appropriate counseling.  You need to weigh the pros and cons of the 3 choices. Then you can make the choice that works for you. SURGERY (D&E)  Procedure over in 1 day  Requires being put to sleep  Bleeding may be light  Possible problems during surgery, including injury to womb(uterus)  Care provider has more control Medicine (CYTOTEC)  The complete procedure may take days to weeks  No Surgery  Bleeding may be heavy at times  There may be drug side effects  Patient has more control Waiting  You may choose to wait, in which case your own body may complete the passing of the abnormal early pregnancy on its own in about 2-4 weeks  Your bleeding may be heavy at times  There is a small possibility that you may need surgery if the bleeding is too much or not all of the pregnancy has passed. CYTOTEC MANAGEMENT Prostaglandins (cytotec) are the most widely used drug for this purpose. They cause the uterus to cramp and contract. You will place the medicine yourself inside your vagina in the privacy of your home. Emptying of the uterus should occur within 3 days but  the process may continue for several weeks. The bleeding may seem heavy at times. POSSIBLE SIDE EFFECTS FROM CYTOTEC  Nausea   Vomiting  Diarrhea Fever  Chills  Hot Flashes Side effects  from the process of the early pregnancy failure include:  Cramping  Bleeding  Headaches  Dizziness RISKS: This is a low risk procedure. Less than 1 in 100 women has a complication. An incomplete passage of the early pregnancy may occur. Also, Hemorrhage (heavy bleeding) could happen.  Rarely the pregnancy will not be passed completely. Excessively heavy bleeding may occur.  Your doctor may need to perform surgery to empty the uterus (D&E). Afterwards: Everybody will feel differently after the early pregnancy completion. You may have soreness or cramps for a day or two. You may have soreness or cramps for day or two.  You may have light bleeding for up to 2 weeks. You may be as active as you feel like being. If you have any of the following problems you may call Maternity Admissions Unit at [phone].  If you have pain that does not get better  with pain medication  Bleeding that soaks through 2 thick full-sized sanitary pads in an hour  Cramps that last longer than 2 days  Foul smelling discharge  Fever above 100.4 degrees F Even if you do not have any of these symptoms, you should have a follow-up exam to make sure you  are healing properly. This appointment will be made for you before you leave the hospital. Your next normal period will start again in 4-6 week after the loss. You can get pregnant soon after the loss, so use birth control right away. Finally: Make sure all your questions are answered before during and after any procedure. Follow up with medical care and family planning methods.       Miscarriage A miscarriage is the sudden loss of an unborn baby (fetus) before the 20th week of pregnancy. Most miscarriages happen in the first 3 months of pregnancy. Sometimes, it happens before  a woman even knows she is pregnant. A miscarriage is also called a "spontaneous miscarriage" or "early pregnancy loss." Having a miscarriage can be an emotional experience. Talk with your caregiver about any questions you may have about miscarrying, the grieving process, and your future pregnancy plans. CAUSES   Problems with the fetal chromosomes that make it impossible for the baby to develop normally. Problems with the baby's genes or chromosomes are most often the result of errors that occur, by chance, as the embryo divides and grows. The problems are not inherited from the parents.  Infection of the cervix or uterus.   Hormone problems.   Problems with the cervix, such as having an incompetent cervix. This is when the tissue in the cervix is not strong enough to hold the pregnancy.   Problems with the uterus, such as an abnormally shaped uterus, uterine fibroids, or congenital abnormalities.   Certain medical conditions.   Smoking, drinking alcohol, or taking illegal drugs.   Trauma.  Often, the cause of a miscarriage is unknown.  SYMPTOMS   Vaginal bleeding or spotting, with or without cramps or pain.  Pain or cramping in the abdomen or lower back.  Passing fluid, tissue, or blood clots from the vagina. DIAGNOSIS  Your caregiver will perform a physical exam. You may also have an ultrasound to confirm the miscarriage. Blood or urine tests may also be ordered. TREATMENT   Sometimes, treatment is not necessary if you naturally pass all the fetal tissue that was in the uterus. If some of the fetus or placenta remains in the body (incomplete miscarriage), tissue left behind may become infected and must be removed. Usually, a dilation and curettage (D and C) procedure is performed. During a D and C procedure, the cervix is widened (dilated) and any remaining fetal or placental tissue is gently removed from the uterus.  Antibiotic medicines are prescribed if there is an  infection. Other medicines may be given to reduce the size of the uterus (contract) if there is a lot of bleeding.  If you have Rh negative blood and your baby was Rh positive, you will need a Rh immunoglobulin shot. This shot will protect any future baby from having Rh blood problems in future pregnancies. HOME CARE INSTRUCTIONS   Your caregiver may order bed rest or may allow you to continue light activity. Resume activity as directed by your caregiver.  Have someone help with home and family responsibilities during this time.   Keep track of the number of sanitary pads you use each day and how soaked (saturated) they are. Write down this information.   Do not use tampons. Do not douche or have sexual intercourse until approved by your caregiver.   Only take over-the-counter or prescription medicines for pain or discomfort as directed by your caregiver.   Do not take aspirin. Aspirin can cause bleeding.   Keep all  follow-up appointments with your caregiver.   If you or your partner have problems with grieving, talk to your caregiver or seek counseling to help cope with the pregnancy loss. Allow enough time to grieve before trying to get pregnant again.  SEEK IMMEDIATE MEDICAL CARE IF:   You have severe cramps or pain in your back or abdomen.  You have a fever.  You pass large blood clots (walnut-sized or larger) ortissue from your vagina. Save any tissue for your caregiver to inspect.   Your bleeding increases.   You have a thick, bad-smelling vaginal discharge.  You become lightheaded, weak, or you faint.   You have chills.  MAKE SURE YOU:  Understand these instructions.  Will watch your condition.  Will get help right away if you are not doing well or get worse. Document Released: 06/22/2000 Document Revised: 04/23/2012 Document Reviewed: 02/15/2011 [facility] Patient Information 2015 [place], [place]. This information is not intended to replace advice given to  you by your health care provider. Make sure you discuss any questions you have with your health care provider.

## 2014-09-27 NOTE — MAU Note (Signed)
Since leaving here on Thursday, bleeding more and cramping more, changing pads yesterday every hour or less.

## 2014-09-29 ENCOUNTER — Telehealth: Payer: Self-pay

## 2014-09-29 NOTE — Telephone Encounter (Signed)
Pt called and stated that she was here on Thursday for a lab draw and someone was suppose to to call her back "I am having more bleeding".   Called pt and I confirmed with pt that she went to MAU on the 17th.  Pt stated that she has miscarried and she has an appt scheduled on Oct 5th for follow up of SAB.  Pt also stated that she did not have any questions.

## 2014-10-15 ENCOUNTER — Encounter: Payer: Self-pay | Admitting: Obstetrics & Gynecology

## 2014-10-15 ENCOUNTER — Ambulatory Visit (INDEPENDENT_AMBULATORY_CARE_PROVIDER_SITE_OTHER): Payer: Self-pay | Admitting: Obstetrics & Gynecology

## 2014-10-15 VITALS — BP 143/98 | HR 67 | Temp 98.5°F | Wt 212.2 lb

## 2014-10-15 DIAGNOSIS — O039 Complete or unspecified spontaneous abortion without complication: Secondary | ICD-10-CM

## 2014-10-15 NOTE — Progress Notes (Signed)
   Subjective:    Patient ID: Alicia Obrien, female    DOB: 18-Feb-1975, 39 y.o.   MRN: [id number]  HPI  39 yo engaged AA P3 is here for follow up after a miscarriage. She took cytotec. She has no complaints except for being sad. She says that she is still able to care for her other kids and still find joy with them. She does still cry on occasion as does her fiance. They would like to conceive asap.  Review of Systems She reports a normal pap 1/16    Objective:   Physical Exam [id number] Breathing, conversing, and ambulating normally Abd- benign       Assessment & Plan:  S/p miscarriage- stable She will come back in 2 weeks if her mood does not continue to improve. Rec MVI daily to help prevent ONTDs

## 2015-01-28 ENCOUNTER — Ambulatory Visit (INDEPENDENT_AMBULATORY_CARE_PROVIDER_SITE_OTHER): Payer: Self-pay | Admitting: Family Medicine

## 2015-01-28 ENCOUNTER — Encounter: Payer: Self-pay | Admitting: Family Medicine

## 2015-01-28 VITALS — BP 130/81 | HR 75 | Temp 98.3°F | Resp 16 | Ht 69.0 in | Wt 218.0 lb

## 2015-01-28 DIAGNOSIS — E1169 Type 2 diabetes mellitus with other specified complication: Secondary | ICD-10-CM | POA: Insufficient documentation

## 2015-01-28 DIAGNOSIS — Z8742 Personal history of other diseases of the female genital tract: Secondary | ICD-10-CM | POA: Insufficient documentation

## 2015-01-28 DIAGNOSIS — I10 Essential (primary) hypertension: Secondary | ICD-10-CM

## 2015-01-28 DIAGNOSIS — E669 Obesity, unspecified: Secondary | ICD-10-CM

## 2015-01-28 DIAGNOSIS — Z872 Personal history of diseases of the skin and subcutaneous tissue: Secondary | ICD-10-CM

## 2015-01-28 DIAGNOSIS — E785 Hyperlipidemia, unspecified: Secondary | ICD-10-CM | POA: Insufficient documentation

## 2015-01-28 DIAGNOSIS — Z803 Family history of malignant neoplasm of breast: Secondary | ICD-10-CM

## 2015-01-28 LAB — COMPLETE METABOLIC PANEL WITH GFR
ALBUMIN: 4.3 g/dL (ref 3.6–5.1)
ALK PHOS: 60 U/L (ref 33–115)
ALT: 14 U/L (ref 6–29)
AST: 17 U/L (ref 10–30)
BILIRUBIN TOTAL: 0.4 mg/dL (ref 0.2–1.2)
BUN: 8 mg/dL (ref 7–25)
CO2: 27 mmol/L (ref 20–31)
Calcium: 9.6 mg/dL (ref 8.6–10.2)
Chloride: 102 mmol/L (ref 98–110)
Creat: 0.66 mg/dL (ref 0.50–1.10)
Glucose, Bld: 82 mg/dL (ref 65–99)
POTASSIUM: 4 mmol/L (ref 3.5–5.3)
SODIUM: 140 mmol/L (ref 135–146)
TOTAL PROTEIN: 7.2 g/dL (ref 6.1–8.1)

## 2015-01-28 LAB — POCT URINALYSIS DIP (DEVICE)
BILIRUBIN URINE: NEGATIVE
GLUCOSE, UA: NEGATIVE mg/dL
Hgb urine dipstick: NEGATIVE
KETONES UR: NEGATIVE mg/dL
LEUKOCYTES UA: NEGATIVE
NITRITE: NEGATIVE
PH: 7 (ref 5.0–8.0)
Protein, ur: NEGATIVE mg/dL
Specific Gravity, Urine: 1.025 (ref 1.005–1.030)
Urobilinogen, UA: 0.2 mg/dL (ref 0.0–1.0)

## 2015-01-28 LAB — TSH: TSH: 2.474 u[IU]/mL (ref 0.350–4.500)

## 2015-01-28 MED ORDER — AMLODIPINE BESYLATE 5 MG PO TABS: 5.0000 mg | ORAL_TABLET | Freq: Every day | ORAL | Status: DC

## 2015-01-28 NOTE — Progress Notes (Signed)
Subjective:    Patient ID: Alicia Obrien, female    DOB: [birth year], 40 y.o.   MRN: [id number]  HPI Ms. Lyubov Margison, a 40 year old female with a history of hypertension presents accompanied by daughter to establish care. She states that she was a patient of Dr. Valarie Merino at [facility] practice in [place] prior to relocating to [place]. She states that she has been taking medications consistently. However, she has not been taking medication for cholesterol in greater than 6 months.  She is not exercising and is not adherent to low salt diet.  She does not check blood pressures at home. Patient denies chest pain, dyspnea, fatigue, orthopnea and palpitations.  Cardiovascular risk factors include: dyslipidemia, obesity (BMI >= 30 kg/m2) and sedentary lifestyle.  Past Medical History  Diagnosis Date  . Vertigo   . Hypertension   . Elevated cholesterol   . Heart murmur    Allergies  Allergen Reactions  . Latex Rash   There is no immunization history on file for this patient.  Social History   Social History  . Marital Status: Single    Spouse Name: N/A  . Number of Children: N/A  . Years of Education: N/A   Occupational History  . Not on file.   Social History Main Topics  . Smoking status: Never Smoker   . Smokeless tobacco: Not on file  . Alcohol Use: No  . Drug Use: No  . Sexual Activity: Yes   Other Topics Concern  . Not on file   Social History Narrative   Review of Systems  Constitutional: Negative.  Negative for fever and fatigue.  HENT: Negative.   Eyes: Negative.   Respiratory: Negative.  Negative for shortness of breath.   Cardiovascular: Negative.   Gastrointestinal: Negative.   Endocrine: Negative.  Negative for polydipsia, polyphagia and polyuria.  Genitourinary: Negative.   Musculoskeletal: Negative.  Negative for myalgias and neck pain.  Skin: Negative.   Allergic/Immunologic: Negative.   Neurological: Negative.  Negative for  dizziness.  Hematological: Negative.   Psychiatric/Behavioral: Negative for suicidal ideas and sleep disturbance. The patient is nervous/anxious.        Objective:   Physical Exam  Constitutional: She is oriented to person, place, and time. She appears well-developed and well-nourished.  HENT:  Head: Normocephalic and atraumatic.  Right Ear: External ear normal.  Left Ear: External ear normal.  Nose: Nose normal.  Mouth/Throat: Oropharynx is clear and moist.  Eyes: Conjunctivae are normal. Pupils are equal, round, and reactive to light.  Neck: Normal range of motion. Neck supple.  Cardiovascular: Normal rate, regular rhythm, normal heart sounds and intact distal pulses.   Pulmonary/Chest: Effort normal and breath sounds normal.  Abdominal: Soft. Bowel sounds are normal.  Musculoskeletal: Normal range of motion.  Neurological: She is alert and oriented to person, place, and time. She has normal reflexes.  Skin: Skin is warm and dry.  Psychiatric: She has a normal mood and affect. Her behavior is normal. Judgment and thought content normal.      BP 130/81 mmHg  Pulse 75  Temp(Src) 98.3 F (36.8 C) (Oral)  Resp 16  Ht 5\' 9"  (1.753 m)  Wt 218 lb (98.884 kg)  BMI 32.18 kg/m2  LMP 01/16/2015  Assessment & Plan:  1. Essential hypertension Blood pressure is at goal on current medication regimen. Will continue Amlodipine at current dosage. The patient is asked to make an attempt to improve diet and exercise patterns to  aid in medical management of this problem. - Lipid Panel; Future - COMPLETE METABOLIC PANEL WITH GFR - POCT urinalysis dipstick - amLODipine (NORVASC) 5 MG tablet; Take 1 tablet (5 mg total) by mouth daily.  Dispense: 30 tablet; Refill: 5  2. Hyperlipidemia Will return for fasting lipid panel in one week. Will consider restarting statin therapy after reviewing laboratory results.   3. Obesity Recommend a lowfat, low carbohydrate diet divided over 5-6 small meals,  increase water intake to 6-8 glasses, and 150 minutes per week of cardiovascular exercise.   - TSH - Hemoglobin A1c  4. History of ovarian cyst Will review medical records as they come available. Patient may warrant a pelvic/transvaginal ultrasound.   5. History of cyst of breast Patient has a family history of breast cancer in grandmother and several aunts. She has had a mammogram in the past. Will send a referral for screening mammography.  - MM DIGITAL SCREENING BILATERAL; Future  6. Family history of breast cancer Refer to #5 - MM DIGITAL SCREENING BILATERAL; Future   RTC:  2 months for CPE    Dorena Dew, FNP

## 2015-01-28 NOTE — Patient Instructions (Addendum)
Recommend a lowfat, low carbohydrate diet divided over 5-6 small meals, increase water intake to 6-8 glasses, and 150 minutes per week of cardiovascular exercise.   Will send a referral for a mammogram Will review medical records as they come available.   Fat and Cholesterol Restricted Diet High levels of fat and cholesterol in your blood may lead to various health problems, such as diseases of the heart, blood vessels, gallbladder, liver, and pancreas. Fats are concentrated sources of energy that come in various forms. Certain types of fat, including saturated fat, may be harmful in excess. Cholesterol is a substance needed by your body in small amounts. Your body makes all the cholesterol it needs. Excess cholesterol comes from the food you eat. When you have high levels of cholesterol and saturated fat in your blood, health problems can develop because the excess fat and cholesterol will gather along the walls of your blood vessels, causing them to narrow. Choosing the right foods will help you control your intake of fat and cholesterol. This will help keep the levels of these substances in your blood within normal limits and reduce your risk of disease. WHAT IS MY PLAN? Your health care provider recommends that you:  Get no more than __________ % of the total calories in your daily diet from fat.  Limit your intake of saturated fat to less than ______% of your total calories each day.  Limit the amount of cholesterol in your diet to less than _________mg per day. WHAT TYPES OF FAT SHOULD I CHOOSE?  Choose healthy fats more often. Choose monounsaturated and polyunsaturated fats, such as olive and canola oil, flaxseeds, walnuts, almonds, and seeds.  Eat more omega-3 fats. Good choices include salmon, mackerel, sardines, tuna, flaxseed oil, and ground flaxseeds. Aim to eat fish at least two times a week.  Limit saturated fats. Saturated fats are primarily found in animal products, such as  meats, butter, and cream. Plant sources of saturated fats include palm oil, palm kernel oil, and coconut oil.  Avoid foods with partially hydrogenated oils in them. These contain trans fats. Examples of foods that contain trans fats are stick margarine, some tub margarines, cookies, crackers, and other baked goods. WHAT GENERAL GUIDELINES DO I NEED TO FOLLOW? These guidelines for healthy eating will help you control your intake of fat and cholesterol:  Check food labels carefully to identify foods with trans fats or high amounts of saturated fat.  Fill one half of your plate with vegetables and green salads.  Fill one fourth of your plate with whole grains. Look for the word "whole" as the first word in the ingredient list.  Fill one fourth of your plate with lean protein foods.  Limit fruit to two servings a day. Choose fruit instead of juice.  Eat more foods that contain soluble fiber. Examples of foods that contain this type of fiber are apples, broccoli, carrots, beans, peas, and barley. Aim to get 20-30 g of fiber per day.  Eat more home-cooked food and less restaurant, buffet, and fast food.  Limit or avoid alcohol.  Limit foods high in starch and sugar.  Limit fried foods.  Cook foods using methods other than frying. Baking, boiling, grilling, and broiling are all great options.  Lose weight if you are overweight. Losing just 5-10% of your initial body weight can help your overall health and prevent diseases such as diabetes and heart disease. WHAT FOODS CAN I EAT? Grains Whole grains, such as whole wheat or whole  grain breads, crackers, cereals, and pasta. Unsweetened oatmeal, bulgur, barley, quinoa, or brown rice. Corn or whole wheat flour tortillas. Vegetables Fresh or frozen vegetables (raw, steamed, roasted, or grilled). Green salads. Fruits All fresh, canned (in natural juice), or frozen fruits. Meat and Other Protein Products Ground beef (85% or leaner), grass-fed  beef, or beef trimmed of fat. Skinless chicken or [place]. Ground chicken or [place]. Pork trimmed of fat. All fish and seafood. Eggs. Dried beans, peas, or lentils. Unsalted nuts or seeds. Unsalted canned or dry beans. Dairy Low-fat dairy products, such as skim or 1% milk, 2% or reduced-fat cheeses, low-fat ricotta or cottage cheese, or plain low-fat yogurt. Fats and Oils Tub margarines without trans fats. Light or reduced-fat mayonnaise and salad dressings. Avocado. Olive, canola, sesame, or safflower oils. Natural peanut or almond butter (choose ones without added sugar and oil). The items listed above may not be a complete list of recommended foods or beverages. Contact your dietitian for more options. WHAT FOODS ARE NOT RECOMMENDED? Grains White bread. White pasta. White rice. Cornbread. Bagels, pastries, and croissants. Crackers that contain trans fat. Vegetables White potatoes. Corn. Creamed or fried vegetables. Vegetables in a cheese sauce. Fruits Dried fruits. Canned fruit in light or heavy syrup. Fruit juice. Meat and Other Protein Products Fatty cuts of meat. Ribs, chicken wings, bacon, sausage, bologna, salami, chitterlings, fatback, hot dogs, bratwurst, and packaged luncheon meats. Liver and organ meats. Dairy Whole or 2% milk, cream, half-and-half, and cream cheese. Whole milk cheeses. Whole-fat or sweetened yogurt. Full-fat cheeses. Nondairy creamers and whipped toppings. Processed cheese, cheese spreads, or cheese curds. Sweets and Desserts Corn syrup, sugars, honey, and molasses. Candy. Jam and jelly. Syrup. Sweetened cereals. Cookies, pies, cakes, donuts, muffins, and ice cream. Fats and Oils Butter, stick margarine, lard, shortening, ghee, or bacon fat. Coconut, palm kernel, or palm oils. Beverages Alcohol. Sweetened drinks (such as sodas, lemonade, and fruit drinks or punches). The items listed above may not be a complete list of foods and beverages to avoid. Contact your  dietitian for more information.   This information is not intended to replace advice given to you by your health care provider. Make sure you discuss any questions you have with your health care provider.   Document Released: 12/27/2004 Document Revised: 01/17/2014 Document Reviewed: 03/27/2013 Elsevier Interactive Patient Education 2016 [employer]. Exercising to [employer] Exercising can help you to lose weight. In order to lose weight through exercise, you need to do vigorous-intensity exercise. You can tell that you are exercising with vigorous intensity if you are breathing very hard and fast and cannot hold a conversation while exercising. Moderate-intensity exercise helps to maintain your current weight. You can tell that you are exercising at a moderate level if you have a higher heart rate and faster breathing, but you are still able to hold a conversation. HOW OFTEN SHOULD I EXERCISE? Choose an activity that you enjoy and set realistic goals. Your health care provider can help you to make an activity plan that works for you. Exercise regularly as directed by your health care provider. This may include:  Doing resistance training twice each week, such as:  Push-ups.  Sit-ups.  Lifting weights.  Using resistance bands.  Doing a given intensity of exercise for a given amount of time. Choose from these options:  150 minutes of moderate-intensity exercise every week.  75 minutes of vigorous-intensity exercise every week.  A mix of moderate-intensity and vigorous-intensity exercise every week. Children, pregnant women, people  who are out of shape, people who are overweight, and older adults may need to consult a health care provider for individual recommendations. If you have any sort of medical condition, be sure to consult your health care provider before starting a new exercise program. WHAT ARE SOME ACTIVITIES THAT CAN HELP ME TO LOSE WEIGHT?   Walking at a rate of at least  4.5 miles an hour.  Jogging or running at a rate of 5 miles per hour.  Biking at a rate of at least 10 miles per hour.  Lap swimming.  Roller-skating or in-line skating.  Cross-country skiing.  Vigorous competitive sports, such as football, basketball, and soccer.  Jumping rope.  Aerobic dancing. HOW CAN I BE MORE ACTIVE IN MY DAY-TO-DAY ACTIVITIES?  Use the stairs instead of the elevator.  Take a walk during your lunch break.  If you drive, park your car farther away from work or school.  If you take public transportation, get off one stop early and walk the rest of the way.  Make all of your phone calls while standing up and walking around.  Get up, stretch, and walk around every 30 minutes throughout the day. WHAT GUIDELINES SHOULD I FOLLOW WHILE EXERCISING?  Do not exercise so much that you hurt yourself, feel dizzy, or get very short of breath.  Consult your health care provider prior to starting a new exercise program.  Wear comfortable clothes and shoes with good support.  Drink plenty of water while you exercise to prevent dehydration or heat stroke. Body water is lost during exercise and must be replaced.  Work out until you breathe faster and your heart beats faster.   This information is not intended to replace advice given to you by your health care provider. Make sure you discuss any questions you have with your health care provider.   Document Released: 01/29/2010 Document Revised: 01/17/2014 Document Reviewed: 05/30/2013 Elsevier Interactive Patient Education 2016 [place] DASH stands for "Dietary Approaches to Stop Hypertension." The DASH eating plan is a healthy eating plan that has been shown to reduce high blood pressure (hypertension). Additional health benefits may include reducing the risk of type 2 diabetes mellitus, heart disease, and stroke. The DASH eating plan may also help with weight loss. WHAT DO I NEED TO KNOW ABOUT  THE DASH EATING PLAN? For the DASH eating plan, you will follow these general guidelines:  Choose foods with a percent daily value for sodium of less than 5% (as listed on the food label).  Use salt-free seasonings or herbs instead of table salt or sea salt.  Check with your health care provider or pharmacist before using salt substitutes.  Eat lower-sodium products, often labeled as "lower sodium" or "no salt added."  Eat fresh foods.  Eat more vegetables, fruits, and low-fat dairy products.  Choose whole grains. Look for the word "whole" as the first word in the ingredient list.  Choose fish and skinless chicken or [place] more often than red meat. Limit fish, poultry, and meat to 6 oz (170 g) each day.  Limit sweets, desserts, sugars, and sugary drinks.  Choose heart-healthy fats.  Limit cheese to 1 oz (28 g) per day.  Eat more home-cooked food and less restaurant, buffet, and fast food.  Limit fried foods.  Cook foods using methods other than frying.  Limit canned vegetables. If you do use them, rinse them well to decrease the sodium.  When eating at a restaurant, ask that  your food be prepared with less salt, or no salt if possible. WHAT FOODS CAN I EAT? Seek help from a dietitian for individual calorie needs. Grains Whole grain or whole wheat bread. Brown rice. Whole grain or whole wheat pasta. Quinoa, bulgur, and whole grain cereals. Low-sodium cereals. Corn or whole wheat flour tortillas. Whole grain cornbread. Whole grain crackers. Low-sodium crackers. Vegetables Fresh or frozen vegetables (raw, steamed, roasted, or grilled). Low-sodium or reduced-sodium tomato and vegetable juices. Low-sodium or reduced-sodium tomato sauce and paste. Low-sodium or reduced-sodium canned vegetables.  Fruits All fresh, canned (in natural juice), or frozen fruits. Meat and Other Protein Products Ground beef (85% or leaner), grass-fed beef, or beef trimmed of fat. Skinless chicken or  [place]. Ground chicken or [place]. Pork trimmed of fat. All fish and seafood. Eggs. Dried beans, peas, or lentils. Unsalted nuts and seeds. Unsalted canned beans. Dairy Low-fat dairy products, such as skim or 1% milk, 2% or reduced-fat cheeses, low-fat ricotta or cottage cheese, or plain low-fat yogurt. Low-sodium or reduced-sodium cheeses. Fats and Oils Tub margarines without trans fats. Light or reduced-fat mayonnaise and salad dressings (reduced sodium). Avocado. Safflower, olive, or canola oils. Natural peanut or almond butter. Other Unsalted popcorn and pretzels. The items listed above may not be a complete list of recommended foods or beverages. Contact your dietitian for more options. WHAT FOODS ARE NOT RECOMMENDED? Grains White bread. White pasta. White rice. Refined cornbread. Bagels and croissants. Crackers that contain trans fat. Vegetables Creamed or fried vegetables. Vegetables in a cheese sauce. Regular canned vegetables. Regular canned tomato sauce and paste. Regular tomato and vegetable juices. Fruits Dried fruits. Canned fruit in light or heavy syrup. Fruit juice. Meat and Other Protein Products Fatty cuts of meat. Ribs, chicken wings, bacon, sausage, bologna, salami, chitterlings, fatback, hot dogs, bratwurst, and packaged luncheon meats. Salted nuts and seeds. Canned beans with salt. Dairy Whole or 2% milk, cream, half-and-half, and cream cheese. Whole-fat or sweetened yogurt. Full-fat cheeses or blue cheese. Nondairy creamers and whipped toppings. Processed cheese, cheese spreads, or cheese curds. Condiments Onion and garlic salt, seasoned salt, table salt, and sea salt. Canned and packaged gravies. Worcestershire sauce. Tartar sauce. Barbecue sauce. Teriyaki sauce. Soy sauce, including reduced sodium. Steak sauce. Fish sauce. Oyster sauce. Cocktail sauce. Horseradish. Ketchup and mustard. Meat flavorings and tenderizers. Bouillon cubes. Hot sauce. Tabasco sauce. Marinades.  Taco seasonings. Relishes. Fats and Oils Butter, stick margarine, lard, shortening, ghee, and bacon fat. Coconut, palm kernel, or palm oils. Regular salad dressings. Other Pickles and olives. Salted popcorn and pretzels. The items listed above may not be a complete list of foods and beverages to avoid. Contact your dietitian for more information. WHERE CAN I FIND MORE INFORMATION? National Heart, Lung, and Blood Institute: travelstabloid.com   This information is not intended to replace advice given to you by your health care provider. Make sure you discuss any questions you have with your health care provider.   Document Released: 12/16/2010 Document Revised: 01/17/2014 Document Reviewed: 10/31/2012 Elsevier Interactive Patient Education [employer].

## 2015-01-29 LAB — HEMOGLOBIN A1C
HEMOGLOBIN A1C: 6.2 % — AB (ref <5.7)
MEAN PLASMA GLUCOSE: 131 mg/dL — AB (ref <117)

## 2015-01-30 ENCOUNTER — Telehealth: Payer: Self-pay

## 2015-01-30 ENCOUNTER — Other Ambulatory Visit (INDEPENDENT_AMBULATORY_CARE_PROVIDER_SITE_OTHER): Payer: Medicaid Other

## 2015-01-30 DIAGNOSIS — I10 Essential (primary) hypertension: Secondary | ICD-10-CM | POA: Diagnosis not present

## 2015-01-30 LAB — LIPID PANEL
CHOL/HDL RATIO: 4.4 ratio (ref <=5.0)
CHOLESTEROL: 240 mg/dL — AB (ref 125–200)
HDL: 55 mg/dL (ref >=46)
LDL CALC: 158 mg/dL — AB (ref <130)
TRIGLYCERIDES: 137 mg/dL (ref <150)
VLDL: 27 mg/dL (ref <30)

## 2015-01-30 NOTE — Telephone Encounter (Signed)
Called and advised of elevated hgba1c and to eat low fat/ low carb diet over 5 to 6 small meals daily. Advised to drink 6 to 8 glasses of water daily and to exercise 150 minutes weekly of cardio exercise. Patient verbalized understanding and had no questions at this time. Thanks!

## 2015-01-30 NOTE — Telephone Encounter (Signed)
-----   Message from Dorena Dew, [place] sent at 01/29/2015  9:33 PM EST ----- Regarding: lab results Please inform patient that hemoglobin A1C is 6.2, which is consistent with prediabetes. Goal is <5.7. Recommend a lowfat, low carbohydrate diet divided over 5-6 small meals, increase water intake to 6-8 glasses, and 150 minutes per week of cardiovascular exercise. Will re-check hemoglobin A1C in 6 months. Please follow up as scheduled.   Thanks   ----- Message -----    From: [facility]    Sent: 01/29/2015   1:18 AM      To: Dorena Dew, FNP

## 2015-01-31 ENCOUNTER — Other Ambulatory Visit: Payer: Self-pay | Admitting: Family Medicine

## 2015-01-31 DIAGNOSIS — E785 Hyperlipidemia, unspecified: Secondary | ICD-10-CM

## 2015-01-31 MED ORDER — ATORVASTATIN CALCIUM 20 MG PO TABS: 20.0000 mg | ORAL_TABLET | Freq: Every day | ORAL | Status: DC

## 2015-02-02 NOTE — Progress Notes (Signed)
Called and left message regarding elevated cholesterol and to start lipitor 20mg  daily as prescribed. Asked patient to follow low fat/ low cholesterol/ low carb diet as discussed last week. Asked patient to call back if any questions. Thanks!

## 2015-02-06 ENCOUNTER — Other Ambulatory Visit: Payer: Self-pay | Admitting: Family Medicine

## 2015-02-06 DIAGNOSIS — Z1231 Encounter for screening mammogram for malignant neoplasm of breast: Secondary | ICD-10-CM

## 2015-02-17 ENCOUNTER — Ambulatory Visit
Admission: RE | Admit: 2015-02-17 | Discharge: 2015-02-17 | Disposition: A | Discharge status: Home / Self Care | Payer: Medicaid Other | Source: Ambulatory Visit | Attending: Family Medicine | Admitting: Family Medicine

## 2015-02-17 DIAGNOSIS — Z1231 Encounter for screening mammogram for malignant neoplasm of breast: Secondary | ICD-10-CM

## 2015-02-18 ENCOUNTER — Other Ambulatory Visit: Payer: Self-pay | Admitting: Family Medicine

## 2015-02-18 DIAGNOSIS — N644 Mastodynia: Secondary | ICD-10-CM

## 2015-02-25 ENCOUNTER — Ambulatory Visit
Admission: RE | Admit: 2015-02-25 | Discharge: 2015-02-25 | Disposition: A | Discharge status: Home / Self Care | Payer: Medicaid Other | Source: Ambulatory Visit | Attending: Family Medicine | Admitting: Family Medicine

## 2015-02-25 DIAGNOSIS — N644 Mastodynia: Secondary | ICD-10-CM

## 2015-03-27 ENCOUNTER — Encounter: Payer: Self-pay | Admitting: Family Medicine

## 2015-03-27 ENCOUNTER — Ambulatory Visit (INDEPENDENT_AMBULATORY_CARE_PROVIDER_SITE_OTHER): Payer: Medicaid Other | Admitting: Family Medicine

## 2015-03-27 VITALS — BP 140/88 | HR 81 | Temp 98.1°F | Resp 16 | Ht 69.0 in | Wt 214.0 lb

## 2015-03-27 DIAGNOSIS — R42 Dizziness and giddiness: Secondary | ICD-10-CM | POA: Diagnosis not present

## 2015-03-27 DIAGNOSIS — I10 Essential (primary) hypertension: Secondary | ICD-10-CM | POA: Diagnosis not present

## 2015-03-27 DIAGNOSIS — E669 Obesity, unspecified: Secondary | ICD-10-CM

## 2015-03-27 DIAGNOSIS — E785 Hyperlipidemia, unspecified: Secondary | ICD-10-CM | POA: Diagnosis not present

## 2015-03-27 MED ORDER — MECLIZINE HCL 25 MG PO TABS: 25.0000 mg | ORAL_TABLET | Freq: Three times a day (TID) | ORAL | Status: DC | PRN

## 2015-03-27 MED ORDER — ATORVASTATIN CALCIUM 20 MG PO TABS: 20.0000 mg | ORAL_TABLET | Freq: Every day | ORAL | Status: DC

## 2015-03-27 MED ORDER — AMLODIPINE BESYLATE 5 MG PO TABS: 5.0000 mg | ORAL_TABLET | Freq: Every day | ORAL | Status: DC

## 2015-03-27 NOTE — Progress Notes (Signed)
Subjective:    Patient ID: Alicia Obrien, female    DOB: [birth year], 40 y.o.   MRN: [id number]  Hypertension This is a chronic problem. The current episode started more than 1 year ago. The problem has been gradually improving since onset. The problem is controlled. Pertinent negatives include no anxiety, blurred vision, chest pain, headaches, malaise/fatigue, neck pain, orthopnea, palpitations, peripheral edema, PND, shortness of breath or sweats. There are no associated agents to hypertension. Risk factors for coronary artery disease include dyslipidemia and obesity. Past treatments include calcium channel blockers. The current treatment provides no improvement. There are no compliance problems.  There is no history of chronic renal disease.  Hyperlipidemia This is a chronic problem. The current episode started more than 1 year ago. The problem is uncontrolled. Recent lipid tests were reviewed and are high. She has no history of chronic renal disease. Pertinent negatives include no chest pain, focal sensory loss, focal weakness, myalgias or shortness of breath. Current antihyperlipidemic treatment includes statins. Compliance problems include adherence to diet.  Risk factors for coronary artery disease include obesity.    Past Medical History  Diagnosis Date  . Vertigo   . Hypertension   . Elevated cholesterol   . Heart murmur    Allergies  Allergen Reactions  . Latex Rash   There is no immunization history on file for this patient.  Social History   Social History  . Marital Status: Single    Spouse Name: N/A  . Number of Children: N/A  . Years of Education: N/A   Occupational History  . Not on file.   Social History Main Topics  . Smoking status: Never Smoker   . Smokeless tobacco: Not on file  . Alcohol Use: No  . Drug Use: No  . Sexual Activity: Yes   Other Topics Concern  . Not on file   Social History Narrative   Review of Systems  Constitutional: Negative.   Negative for fever, malaise/fatigue and fatigue.  HENT: Negative.   Eyes: Negative.  Negative for blurred vision.  Respiratory: Negative.  Negative for shortness of breath.   Cardiovascular: Negative.  Negative for chest pain, palpitations, orthopnea and PND.  Gastrointestinal: Negative.   Endocrine: Negative.  Negative for polydipsia, polyphagia and polyuria.  Genitourinary: Negative.   Musculoskeletal: Negative.  Negative for myalgias and neck pain.  Skin: Negative.   Allergic/Immunologic: Negative.   Neurological: Negative.  Negative for dizziness, focal weakness and headaches.  Hematological: Negative.   Psychiatric/Behavioral: Negative for suicidal ideas and sleep disturbance. The patient is nervous/anxious.        Objective:   Physical Exam  Constitutional: She is oriented to person, place, and time. She appears well-developed and well-nourished.  HENT:  Head: Normocephalic and atraumatic.  Right Ear: External ear normal.  Left Ear: External ear normal.  Nose: Nose normal.  Mouth/Throat: Oropharynx is clear and moist.  Eyes: Conjunctivae are normal. Pupils are equal, round, and reactive to light.  Neck: Normal range of motion. Neck supple.  Cardiovascular: Normal rate, regular rhythm, normal heart sounds and intact distal pulses.   Pulmonary/Chest: Effort normal and breath sounds normal.  Abdominal: Soft. Bowel sounds are normal.  Musculoskeletal: Normal range of motion.  Neurological: She is alert and oriented to person, place, and time. She has normal reflexes.  Skin: Skin is warm and dry.  Psychiatric: She has a normal mood and affect. Her behavior is normal. Judgment and thought content normal.      BP 143/97  mmHg  Pulse 81  Temp(Src) 98.1 F (36.7 C) (Oral)  Resp 16  Ht 5\' 9"  (1.753 m)  Wt 214 lb (97.07 kg)  BMI 31.59 kg/m2  LMP 03/20/2015  Assessment & Plan:  1. Essential hypertension Blood pressure is at goal on current medication regimen. Will continue  Amlodipine at current dosage. The patient is asked to make an attempt to improve diet and exercise patterns to aid in medical management of this problem.  - amLODipine (NORVASC) 5 MG tablet; Take 1 tablet (5 mg total) by mouth daily.  Dispense: 30 tablet; Refill: 5  2. Hyperlipidemia - atorvastatin (LIPITOR) 20 MG tablet; Take 1 tablet (20 mg total) by mouth daily.  Dispense: 90 tablet; Refill: 1  3. Vertigo  - meclizine (ANTIVERT) 25 MG tablet; Take 1 tablet (25 mg total) by mouth 3 (three) times daily as needed for dizziness.  Dispense: 30 tablet; Refill: 0 4. Obesity Recommend a lowfat, low carbohydrate diet divided over 5-6 small meals, increase water intake to 6-8 glasses, and 150 minutes per week of cardiovascular exercise.   - TSH - Hemoglobin A1c    RTC: 3 months for hypertension Hollis,Lachina M, FNP

## 2015-04-08 ENCOUNTER — Other Ambulatory Visit: Payer: Self-pay

## 2015-05-08 ENCOUNTER — Encounter (HOSPITAL_COMMUNITY): Payer: Self-pay | Admitting: Certified Nurse Midwife

## 2015-05-08 ENCOUNTER — Inpatient Hospital Stay (HOSPITAL_COMMUNITY)
Admission: AD | Admit: 2015-05-08 | Discharge: 2015-05-08 | Disposition: A | Discharge status: Home / Self Care | Payer: Medicaid Other | Source: Ambulatory Visit | Attending: Obstetrics & Gynecology | Admitting: Obstetrics & Gynecology

## 2015-05-08 DIAGNOSIS — E7800 Pure hypercholesterolemia, unspecified: Secondary | ICD-10-CM | POA: Diagnosis not present

## 2015-05-08 DIAGNOSIS — Z3202 Encounter for pregnancy test, result negative: Secondary | ICD-10-CM | POA: Diagnosis not present

## 2015-05-08 DIAGNOSIS — I10 Essential (primary) hypertension: Secondary | ICD-10-CM | POA: Diagnosis not present

## 2015-05-08 DIAGNOSIS — R102 Pelvic and perineal pain: Secondary | ICD-10-CM | POA: Insufficient documentation

## 2015-05-08 DIAGNOSIS — R1030 Lower abdominal pain, unspecified: Secondary | ICD-10-CM | POA: Diagnosis present

## 2015-05-08 LAB — URINALYSIS, ROUTINE W REFLEX MICROSCOPIC
Bilirubin Urine: NEGATIVE
GLUCOSE, UA: NEGATIVE mg/dL
KETONES UR: NEGATIVE mg/dL
LEUKOCYTES UA: NEGATIVE
Nitrite: NEGATIVE
PH: 5.5 (ref 5.0–8.0)
Protein, ur: NEGATIVE mg/dL
Specific Gravity, Urine: 1.01 (ref 1.005–1.030)

## 2015-05-08 LAB — URINE MICROSCOPIC-ADD ON: RBC / HPF: NONE SEEN RBC/hpf (ref 0–5)

## 2015-05-08 LAB — CBC
HCT: 35.3 % — ABNORMAL LOW (ref 36.0–46.0)
HEMOGLOBIN: 11.6 g/dL — AB (ref 12.0–15.0)
MCH: 27 pg (ref 26.0–34.0)
MCHC: 32.9 g/dL (ref 30.0–36.0)
MCV: 82.1 fL (ref 78.0–100.0)
Platelets: 187 10*3/uL (ref 150–400)
RBC: 4.3 MIL/uL (ref 3.87–5.11)
RDW: 13.9 % (ref 11.5–15.5)
WBC: 6.7 10*3/uL (ref 4.0–10.5)

## 2015-05-08 LAB — WET PREP, GENITAL
CLUE CELLS WET PREP: NONE SEEN
Sperm: NONE SEEN
TRICH WET PREP: NONE SEEN
YEAST WET PREP: NONE SEEN

## 2015-05-08 LAB — POCT PREGNANCY, URINE: Preg Test, Ur: NEGATIVE

## 2015-05-08 LAB — HCG, QUANTITATIVE, PREGNANCY

## 2015-05-08 MED ORDER — NAPROXEN 500 MG PO TABS: 500.0000 mg | ORAL_TABLET | Freq: Two times a day (BID) | ORAL | Status: DC

## 2015-05-08 MED ORDER — KETOROLAC TROMETHAMINE 30 MG/ML IJ SOLN
30.0000 mg | Freq: Once | INTRAMUSCULAR | Status: AC
  Administered 2015-05-08: 30 mg via INTRAMUSCULAR
  Filled 2015-05-08: qty 1

## 2015-05-08 NOTE — MAU Note (Signed)
Pt states she had what she thinks is a miscarriage last week. Pt states she had one day of bleeding following 3 days of spotting. Pt states she is here today because she has had abdominal cramping for 3 days that is getting worse.

## 2015-05-08 NOTE — MAU Provider Note (Signed)
OB/GYN Attending MAU Note  Chief Complaint: Abdominal Cramping  First Provider Initiated Contact with Patient 05/08/15 1106     Alicia Obrien is a 40 y.o. [id number] who presents with three day history of lower abdominal cramping.  Cramping is constant, in lower abdomen/pelvis, diffuse, moderate in intensity. Alleviated by Ibuprofen.  Had similar episode of pain a few years ago when she had a right ovarian cyst. No associated bleeding, N/V, GI symptoms. She does report increased urinary frequency, but no dysuria or fevers.   Past Medical History  Diagnosis Date  . Vertigo   . Hypertension   . Elevated cholesterol   . Heart murmur    OB History  Gravida Para Term Preterm AB SAB TAB Ectopic Multiple Living  4 3 3  1 1    3     # Outcome Date GA Lbr Len/2nd Weight Sex Delivery Anes PTL Lv  4 SAB 09/27/14          3 Term 06/18/07    M Vag-Spont     2 Term 08/31/01    F Vag-Spont     1 Term 05/01/98    F Vag-Spont        History reviewed. No pertinent past surgical history. Social History   Social History  . Marital Status: Single    Spouse Name: N/A  . Number of Children: N/A  . Years of Education: N/A   Occupational History  . Not on file.   Social History Main Topics  . Smoking status: Never Smoker   . Smokeless tobacco: Not on file  . Alcohol Use: No  . Drug Use: No  . Sexual Activity: Yes   Other Topics Concern  . Not on file   Social History Narrative   No current facility-administered medications on file prior to encounter.   Current Outpatient Prescriptions on File Prior to Encounter  Medication Sig Dispense Refill  . amLODipine (NORVASC) 5 MG tablet Take 1 tablet (5 mg total) by mouth daily. 30 tablet 5  . atorvastatin (LIPITOR) 20 MG tablet Take 1 tablet (20 mg total) by mouth daily. 90 tablet 1  . meclizine (ANTIVERT) 25 MG tablet Take 1 tablet (25 mg total) by mouth 3 (three) times daily as needed for dizziness. 30 tablet 0   Allergies   Allergen Reactions  . Latex Rash    ROS: Pertinent items in HPI  OBJECTIVE BP 127/79 mmHg  Pulse 58  Temp(Src) 98.2 F (36.8 C) (Oral)  Resp 20  Ht 5\' 9"  (1.753 m)  Wt 214 lb 12.8 oz (97.433 kg)  BMI 31.71 kg/m2  LMP 07/23/2014 CONSTITUTIONAL: Well-developed, well-nourished female in no acute distress.  HENT:  Normocephalic, atraumatic, External right and left ear normal. Oropharynx is clear and moist EYES: Conjunctivae and EOM are normal. Pupils are equal, round, and reactive to light. No scleral icterus.  NECK: Normal range of motion, supple, no masses.  Normal thyroid.  SKIN: Skin is warm and dry. No rash noted. Not diaphoretic. No erythema. No pallor. [place]: Alert and oriented to person, place, and time. Normal reflexes, muscle tone coordination. No cranial nerve deficit noted. PSYCHIATRIC: Normal mood and affect. Normal behavior. Normal judgment and thought content. CARDIOVASCULAR: Normal heart rate noted RESPIRATORY: Effort and breath sounds normal, no problems with respiration noted. ABDOMEN: Soft, normal bowel sounds, no distention noted.  Mild, diffuse, lower abdominal tenderness, no rebound or guarding.  PELVIC: Normal appearing external genitalia; normal appearing vaginal mucosa and cervix.  Copious clear- white  discharge, wet prep and pelvic cultures obtained.  Normal uterine size, no other palpable masses, + mild cervical and uterine tenderness. MUSCULOSKELETAL: Normal range of motion. No tenderness.  No cyanosis, clubbing, or edema.  2+ distal pulses.  LAB RESULTS Results for orders placed or performed during the hospital encounter of 05/08/15 (from the past 24 hour(s))  Urinalysis, Routine w reflex microscopic (not at [facility])     Status: Abnormal   Collection Time: 05/08/15 10:43 AM  Result Value Ref Range   Color, Urine YELLOW YELLOW   APPearance CLOUDY (A) CLEAR   Specific Gravity, Urine 1.010 1.005 - 1.030   pH 5.5 5.0 - 8.0   Glucose, UA NEGATIVE NEGATIVE  mg/dL   Hgb urine dipstick TRACE (A) NEGATIVE   Bilirubin Urine NEGATIVE NEGATIVE   Ketones, ur NEGATIVE NEGATIVE mg/dL   Protein, ur NEGATIVE NEGATIVE mg/dL   Nitrite NEGATIVE NEGATIVE   Leukocytes, UA NEGATIVE NEGATIVE  Urine microscopic-add on     Status: Abnormal   Collection Time: 05/08/15 10:43 AM  Result Value Ref Range   Squamous Epithelial / LPF 6-30 (A) NONE SEEN   WBC, UA 0-5 0 - 5 WBC/hpf   RBC / HPF NONE SEEN 0 - 5 RBC/hpf   Bacteria, UA FEW (A) NONE SEEN  Pregnancy, urine POC     Status: None   Collection Time: 05/08/15 10:53 AM  Result Value Ref Range   Preg Test, Ur NEGATIVE NEGATIVE  CBC     Status: Abnormal   Collection Time: 05/08/15 12:23 PM  Result Value Ref Range   WBC 6.7 4.0 - 10.5 K/uL   RBC 4.30 3.87 - 5.11 MIL/uL   Hemoglobin 11.6 (L) 12.0 - 15.0 g/dL   HCT 35.3 (L) 36.0 - 46.0 %   MCV 82.1 78.0 - 100.0 fL   MCH 27.0 26.0 - 34.0 pg   MCHC 32.9 30.0 - 36.0 g/dL   RDW 13.9 11.5 - 15.5 %   Platelets 187 150 - 400 K/uL  Wet prep, genital     Status: Abnormal   Collection Time: 05/08/15 12:40 PM  Result Value Ref Range   Yeast Wet Prep HPF POC NONE SEEN NONE SEEN   Trich, Wet Prep NONE SEEN NONE SEEN   Clue Cells Wet Prep HPF POC NONE SEEN NONE SEEN   WBC, Wet Prep HPF POC FEW (A) NONE SEEN   Sperm NONE SEEN    MAU COURSE Toradol 30 mg IM given  ASSESSMENT 1. Pelvic pain in female   Low concern about cervicitis, but cultures pending  PLAN Discharge home with Naproxen Outpatient pelvic ultrasound ordered for Tuesday 05/12/2015 at 8 am; information given to patient; patient can call and cancel this if pain improves Will follow up cultures and manage accordingly    Medication List    TAKE these medications        amLODipine 5 MG tablet  Commonly known as:  NORVASC  Take 1 tablet (5 mg total) by mouth daily.     atorvastatin 20 MG tablet  Commonly known as:  LIPITOR  Take 1 tablet (20 mg total) by mouth daily.     meclizine 25 MG tablet   Commonly known as:  ANTIVERT  Take 1 tablet (25 mg total) by mouth 3 (three) times daily as needed for dizziness.     naproxen 500 MG tablet  Commonly known as:  NAPROSYN  Take 1 tablet (500 mg total) by mouth 2 (two) times daily with a meal. As  needed for pain         Osborne Oman, MD 05/08/2015 1:06 PM

## 2015-05-08 NOTE — Discharge Instructions (Signed)

## 2015-05-09 LAB — HIV ANTIBODY (ROUTINE TESTING W REFLEX): HIV SCREEN 4TH GENERATION: NONREACTIVE

## 2015-05-11 LAB — GC/CHLAMYDIA PROBE AMP (~~LOC~~) NOT AT ARMC
Chlamydia: NEGATIVE
NEISSERIA GONORRHEA: NEGATIVE

## 2015-05-12 ENCOUNTER — Encounter: Payer: Self-pay | Admitting: General Practice

## 2015-05-12 ENCOUNTER — Ambulatory Visit (HOSPITAL_COMMUNITY)
Admit: 2015-05-12 | Discharge: 2015-05-12 | Disposition: A | Discharge status: Home / Self Care | Payer: Medicaid Other | Attending: Obstetrics & Gynecology | Admitting: Obstetrics & Gynecology

## 2015-05-12 ENCOUNTER — Telehealth: Payer: Self-pay | Admitting: General Practice

## 2015-05-12 DIAGNOSIS — R935 Abnormal findings on diagnostic imaging of other abdominal regions, including retroperitoneum: Secondary | ICD-10-CM | POA: Insufficient documentation

## 2015-05-12 DIAGNOSIS — R102 Pelvic and perineal pain: Secondary | ICD-10-CM | POA: Insufficient documentation

## 2015-05-12 NOTE — Telephone Encounter (Signed)
Telephone call to patient regarding ultrasound results. No answer- left message stating we are trying to reach you regarding non urgent results, please call us back at the clinics. Will send letter

## 2015-06-29 ENCOUNTER — Encounter: Payer: Self-pay | Admitting: Family Medicine

## 2015-06-29 ENCOUNTER — Ambulatory Visit (INDEPENDENT_AMBULATORY_CARE_PROVIDER_SITE_OTHER): Payer: Medicaid Other | Admitting: Family Medicine

## 2015-06-29 VITALS — BP 135/84 | HR 66 | Temp 98.1°F | Resp 16 | Ht 69.0 in | Wt 214.0 lb

## 2015-06-29 DIAGNOSIS — G43009 Migraine without aura, not intractable, without status migrainosus: Secondary | ICD-10-CM

## 2015-06-29 DIAGNOSIS — E785 Hyperlipidemia, unspecified: Secondary | ICD-10-CM | POA: Diagnosis not present

## 2015-06-29 DIAGNOSIS — I10 Essential (primary) hypertension: Secondary | ICD-10-CM | POA: Diagnosis not present

## 2015-06-29 DIAGNOSIS — Z8742 Personal history of other diseases of the female genital tract: Secondary | ICD-10-CM | POA: Diagnosis not present

## 2015-06-29 LAB — POCT URINALYSIS DIP (DEVICE)
BILIRUBIN URINE: NEGATIVE
GLUCOSE, UA: NEGATIVE mg/dL
Hgb urine dipstick: NEGATIVE
KETONES UR: NEGATIVE mg/dL
Nitrite: NEGATIVE
PH: 5.5 (ref 5.0–8.0)
PROTEIN: NEGATIVE mg/dL
SPECIFIC GRAVITY, URINE: 1.02 (ref 1.005–1.030)
Urobilinogen, UA: 0.2 mg/dL (ref 0.0–1.0)

## 2015-06-29 LAB — BASIC METABOLIC PANEL WITH GFR
BUN: 13 mg/dL (ref 7–25)
CHLORIDE: 103 mmol/L (ref 98–110)
CO2: 28 mmol/L (ref 20–31)
Calcium: 9.1 mg/dL (ref 8.6–10.2)
Creat: 0.91 mg/dL (ref 0.50–1.10)
GFR, EST NON AFRICAN AMERICAN: 80 mL/min (ref >=60)
GFR, Est African American: >89 mL/min (ref >=60)
GLUCOSE: 103 mg/dL — AB (ref 65–99)
POTASSIUM: 4.4 mmol/L (ref 3.5–5.3)
Sodium: 139 mmol/L (ref 135–146)

## 2015-06-29 LAB — LIPID PANEL
CHOLESTEROL: 252 mg/dL — AB (ref 125–200)
HDL: 38 mg/dL — ABNORMAL LOW (ref >=46)
TRIGLYCERIDES: 671 mg/dL — AB (ref <150)
Total CHOL/HDL Ratio: 6.6 Ratio — ABNORMAL HIGH (ref <=5.0)

## 2015-06-29 MED ORDER — KETOPROFEN 50 MG PO CAPS: 50.0000 mg | ORAL_CAPSULE | Freq: Four times a day (QID) | ORAL | Status: DC | PRN

## 2015-06-29 NOTE — Patient Instructions (Addendum)
Maintain a headache diary Apply cool compresses to base of neck and head. Rest in a dark, quiet room when migraine headaches occur.   Migraine Headache A migraine headache is an intense, throbbing pain on one or both sides of your head. A migraine can last for 30 minutes to several hours. CAUSES  The exact cause of a migraine headache is not always known. However, a migraine may be caused when nerves in the brain become irritated and release chemicals that cause inflammation. This causes pain. Certain things may also trigger migraines, such as:  Alcohol.  Smoking.  Stress.  Menstruation.  Aged cheeses.  Foods or drinks that contain nitrates, glutamate, aspartame, or tyramine.  Lack of sleep.  Chocolate.  Caffeine.  Hunger.  Physical exertion.  Fatigue.  Medicines used to treat chest pain (nitroglycerine), birth control pills, estrogen, and some blood pressure medicines. SIGNS AND SYMPTOMS  Pain on one or both sides of your head.  Pulsating or throbbing pain.  Severe pain that prevents daily activities.  Pain that is aggravated by any physical activity.  Nausea, vomiting, or both.  Dizziness.  Pain with exposure to bright lights, loud noises, or activity.  General sensitivity to bright lights, loud noises, or smells. Before you get a migraine, you may get warning signs that a migraine is coming (aura). An aura may include:  Seeing flashing lights.  Seeing bright spots, halos, or zigzag lines.  Having tunnel vision or blurred vision.  Having feelings of numbness or tingling.  Having trouble talking.  Having muscle weakness. DIAGNOSIS  A migraine headache is often diagnosed based on:  Symptoms.  Physical exam.  A CT scan or MRI of your head. These imaging tests cannot diagnose migraines, but they can help rule out other causes of headaches. TREATMENT Medicines may be given for pain and nausea. Medicines can also be given to help prevent recurrent  migraines.  HOME CARE INSTRUCTIONS  Only take over-the-counter or prescription medicines for pain or discomfort as directed by your health care provider. The use of long-term narcotics is not recommended.  Lie down in a dark, quiet room when you have a migraine.  Keep a journal to find out what may trigger your migraine headaches. For example, write down:  What you eat and drink.  How much sleep you get.  Any change to your diet or medicines.  Limit alcohol consumption.  Quit smoking if you smoke.  Get 7-9 hours of sleep, or as recommended by your health care provider.  Limit stress.  Keep lights dim if bright lights bother you and make your migraines worse. SEEK IMMEDIATE MEDICAL CARE IF:   Your migraine becomes severe.  You have a fever.  You have a stiff neck.  You have vision loss.  You have muscular weakness or loss of muscle control.  You start losing your balance or have trouble walking.  You feel faint or pass out.  You have severe symptoms that are different from your first symptoms. MAKE SURE YOU:   Understand these instructions.  Will watch your condition.  Will get help right away if you are not doing well or get worse.   This information is not intended to replace advice given to you by your health care provider. Make sure you discuss any questions you have with your health care provider.   Document Released: 12/27/2004 Document Revised: 01/17/2014 Document Reviewed: 09/03/2012 Elsevier Interactive Patient Education [employer].

## 2015-06-29 NOTE — Progress Notes (Signed)
Subjective:    Patient ID: Alicia Obrien, female    DOB: [birth year], 40 y.o.   MRN: [id number]  Hypertension This is a chronic problem. The current episode started more than 1 year ago. The problem has been gradually improving since onset. The problem is controlled. Associated symptoms include headaches. Pertinent negatives include no anxiety, blurred vision, chest pain, malaise/fatigue, neck pain, orthopnea, palpitations, peripheral edema, PND, shortness of breath or sweats. There are no associated agents to hypertension. Risk factors for coronary artery disease include dyslipidemia and obesity. Past treatments include calcium channel blockers. The current treatment provides no improvement. There are no compliance problems.  There is no history of chronic renal disease.  Hyperlipidemia This is a chronic problem. The current episode started more than 1 year ago. The problem is uncontrolled. Recent lipid tests were reviewed and are high. Exacerbating diseases include obesity. She has no history of chronic renal disease. Pertinent negatives include no chest pain, focal sensory loss, focal weakness, myalgias or shortness of breath. Current antihyperlipidemic treatment includes statins. Compliance problems include adherence to diet.  Risk factors for coronary artery disease include obesity.  Migraine  This is a recurrent problem. The current episode started 1 to 4 weeks ago. The problem occurs constantly. The problem has been resolved. The pain is located in the frontal region. The pain quality is not similar to prior headaches. The pain is at a severity of 5/10. The pain is mild. Associated symptoms include phonophobia and photophobia. Pertinent negatives include no blurred vision, dizziness, fever, loss of balance, muscle aches, neck pain, visual change, vomiting, weakness or weight loss. The symptoms are aggravated by bright light. She has tried acetaminophen for the symptoms. The treatment provided mild  relief. Her past medical history is significant for hypertension, migraine headaches and obesity. There is no history of recent head traumas, sinus disease or TMJ.   Past Medical History  Diagnosis Date  . Vertigo   . Hypertension   . Elevated cholesterol   . Heart murmur    Allergies  Allergen Reactions  . Latex Rash   There is no immunization history on file for this patient.  Social History   Social History  . Marital Status: Single    Spouse Name: N/A  . Number of Children: N/A  . Years of Education: N/A   Occupational History  . Not on file.   Social History Main Topics  . Smoking status: Never Smoker   . Smokeless tobacco: Not on file  . Alcohol Use: No  . Drug Use: No  . Sexual Activity: Yes   Other Topics Concern  . Not on file   Social History Narrative   Review of Systems  Constitutional: Negative.  Negative for fever, weight loss, malaise/fatigue and fatigue.  Eyes: Positive for photophobia. Negative for blurred vision and visual disturbance.  Respiratory: Negative.  Negative for shortness of breath.   Cardiovascular: Negative.  Negative for chest pain, palpitations, orthopnea and PND.  Gastrointestinal: Negative.  Negative for vomiting.  Endocrine: Negative.  Negative for polydipsia, polyphagia and polyuria.  Genitourinary: Positive for pelvic pain (periodic).  Musculoskeletal: Negative.  Negative for myalgias and neck pain.  Skin: Negative.   Allergic/Immunologic: Negative.  Negative for immunocompromised state.  Neurological: Positive for headaches. Negative for dizziness, focal weakness, weakness and loss of balance.  Hematological: Negative.   Psychiatric/Behavioral: Negative for suicidal ideas and sleep disturbance. The patient is nervous/anxious.        Objective:   Physical Exam  Constitutional: She is oriented to person, place, and time. She appears well-developed and well-nourished.  HENT:  Head: Normocephalic and atraumatic.  Right Ear:  External ear normal.  Left Ear: External ear normal.  Nose: Nose normal.  Mouth/Throat: Oropharynx is clear and moist.  Eyes: Conjunctivae are normal. Pupils are equal, round, and reactive to light.  Neck: Normal range of motion. Neck supple.  Cardiovascular: Normal rate, regular rhythm, normal heart sounds and intact distal pulses.   Pulmonary/Chest: Effort normal and breath sounds normal.  Abdominal: Soft. Bowel sounds are normal.  Musculoskeletal: Normal range of motion.  Neurological: She is alert and oriented to person, place, and time. She has normal reflexes.  Skin: Skin is warm and dry.  Psychiatric: She has a normal mood and affect. Her behavior is normal. Judgment and thought content normal.      BP 135/84 mmHg  Pulse 66  Temp(Src) 98.1 F (36.7 C) (Oral)  Resp 16  Ht 5\' 9"  (1.753 m)  Wt 214 lb (97.07 kg)  BMI 31.59 kg/m2  SpO2 100%  LMP 06/21/2015  Assessment & Plan:  1. Essential hypertension The patient is asked to make an attempt to improve diet and exercise patterns to aid in medical management of this problem. - BASIC METABOLIC PANEL WITH GFR  2. Hyperlipidemia Recommend a lowfat, low carbohydrate diet divided over 5-6 small meals, increase water intake to 6-8 glasses, and 150 minutes per week of cardiovascular exercise.   - Lipid Panel  3. Migraine without aura and without status migrainosus, not intractable - ketoprofen (ORUDIS) 50 MG capsule; Take 1 capsule (50 mg total) by mouth every 6 (six) hours as needed.  Dispense: 30 capsule; Refill: 0  4. History of ovarian cyst Patient complains of periodic pelvic pain. She says that it has been greater than 1 year since last pap smear. 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 Patient had a transvaginal ultrasound on 05/12/2015, small amount of free pelvic fluid noted, otherwise exam was unremarkable. Will send a referral to gynecology for further evaluation.  - Ambulatory referral to Gynecology      RTC: 3  months for hypertension Dorena Dew, FNP

## 2015-06-30 ENCOUNTER — Other Ambulatory Visit: Payer: Self-pay | Admitting: Family Medicine

## 2015-06-30 DIAGNOSIS — E785 Hyperlipidemia, unspecified: Secondary | ICD-10-CM

## 2015-06-30 MED ORDER — ATORVASTATIN CALCIUM 40 MG PO TABS: 40.0000 mg | ORAL_TABLET | Freq: Every day | ORAL | Status: DC

## 2015-06-30 NOTE — Progress Notes (Signed)
Called and spoke with patient advised of elevated cholesterol and triglycerides. Advised of need to increase atorvastatin to 40mg  every night. Advised patient to follow low fat/low carb diet over 5 to 6 meals daily. Advised patient to drink 6 to 8 glasses of water daily and to get 150 minutes weekly of cardiovascular exercise. Patient verbalized understanding and had no other questions at this time. Thanks!

## 2015-07-28 ENCOUNTER — Encounter: Payer: Self-pay | Admitting: Family Medicine

## 2015-07-28 ENCOUNTER — Ambulatory Visit (INDEPENDENT_AMBULATORY_CARE_PROVIDER_SITE_OTHER): Payer: Medicaid Other

## 2015-07-28 ENCOUNTER — Telehealth: Payer: Self-pay

## 2015-07-28 VITALS — BP 120/86 | HR 40

## 2015-07-28 DIAGNOSIS — Z3201 Encounter for pregnancy test, result positive: Secondary | ICD-10-CM | POA: Diagnosis present

## 2015-07-28 DIAGNOSIS — Z3200 Encounter for pregnancy test, result unknown: Secondary | ICD-10-CM

## 2015-07-28 LAB — POCT PREGNANCY, URINE: PREG TEST UR: POSITIVE — AB

## 2015-07-28 MED ORDER — PRENATAL VITAMINS PLUS 27-1 MG PO TABS: 1.0000 | ORAL_TABLET | Freq: Once | ORAL | Status: DC

## 2015-07-28 NOTE — Telephone Encounter (Signed)
Patient presented today for a pregnancy test. Test confirms she is pregnant at this time around 5 weeks. I have reviewed patient medications at this time. Per Sharyon Medicus patient should stop taking Lipitor doing this pregnancy. Her blood pressure was 120/56. Patient was advised to stop taking Amlodipine for her blood pressure but going forward she should continuing monitoring her blood pressure at least once per week. Patient advised to check her blood pressure at any [facility] or [facility] pharam[facility] to make sure her pressure is running ok.

## 2015-07-29 ENCOUNTER — Other Ambulatory Visit: Payer: Self-pay | Admitting: Obstetrics

## 2015-07-29 ENCOUNTER — Telehealth: Payer: Self-pay | Admitting: *Deleted

## 2015-07-29 NOTE — Telephone Encounter (Signed)
See phone note for this encounter. 

## 2015-08-24 ENCOUNTER — Encounter: Payer: Medicaid Other | Admitting: Obstetrics & Gynecology

## 2015-09-01 ENCOUNTER — Encounter: Payer: Self-pay | Admitting: Obstetrics & Gynecology

## 2015-09-16 ENCOUNTER — Inpatient Hospital Stay (HOSPITAL_COMMUNITY): Payer: Self-pay

## 2015-09-16 ENCOUNTER — Inpatient Hospital Stay (HOSPITAL_COMMUNITY)
Admission: AD | Admit: 2015-09-16 | Discharge: 2015-09-16 | Disposition: A | Discharge status: Home / Self Care | Payer: Self-pay | Source: Ambulatory Visit | Attending: Obstetrics and Gynecology | Admitting: Obstetrics and Gynecology

## 2015-09-16 ENCOUNTER — Encounter: Payer: Self-pay | Admitting: Obstetrics and Gynecology

## 2015-09-16 ENCOUNTER — Encounter (HOSPITAL_COMMUNITY): Payer: Self-pay | Admitting: *Deleted

## 2015-09-16 DIAGNOSIS — Z3A12 12 weeks gestation of pregnancy: Secondary | ICD-10-CM | POA: Insufficient documentation

## 2015-09-16 DIAGNOSIS — Z79899 Other long term (current) drug therapy: Secondary | ICD-10-CM | POA: Insufficient documentation

## 2015-09-16 DIAGNOSIS — O021 Missed abortion: Secondary | ICD-10-CM | POA: Insufficient documentation

## 2015-09-16 DIAGNOSIS — O209 Hemorrhage in early pregnancy, unspecified: Secondary | ICD-10-CM

## 2015-09-16 DIAGNOSIS — O162 Unspecified maternal hypertension, second trimester: Secondary | ICD-10-CM | POA: Insufficient documentation

## 2015-09-16 DIAGNOSIS — IMO0002 Reserved for concepts with insufficient information to code with codable children: Secondary | ICD-10-CM

## 2015-09-16 LAB — CBC
HEMATOCRIT: 33.1 % — AB (ref 36.0–46.0)
HEMOGLOBIN: 11.4 g/dL — AB (ref 12.0–15.0)
MCH: 28.1 pg (ref 26.0–34.0)
MCHC: 34.4 g/dL (ref 30.0–36.0)
MCV: 81.7 fL (ref 78.0–100.0)
Platelets: 193 10*3/uL (ref 150–400)
RBC: 4.05 MIL/uL (ref 3.87–5.11)
RDW: 14.5 % (ref 11.5–15.5)
WBC: 7.3 10*3/uL (ref 4.0–10.5)

## 2015-09-16 LAB — WET PREP, GENITAL
Sperm: NONE SEEN
TRICH WET PREP: NONE SEEN
YEAST WET PREP: NONE SEEN

## 2015-09-16 LAB — URINE MICROSCOPIC-ADD ON

## 2015-09-16 LAB — URINALYSIS, ROUTINE W REFLEX MICROSCOPIC
Bilirubin Urine: NEGATIVE
GLUCOSE, UA: NEGATIVE mg/dL
KETONES UR: NEGATIVE mg/dL
LEUKOCYTES UA: NEGATIVE
Nitrite: NEGATIVE
PROTEIN: NEGATIVE mg/dL
Specific Gravity, Urine: 1.025 (ref 1.005–1.030)
pH: 6 (ref 5.0–8.0)

## 2015-09-16 LAB — HCG, QUANTITATIVE, PREGNANCY: HCG, BETA CHAIN, QUANT, S: 3783 m[IU]/mL — AB (ref <5)

## 2015-09-16 MED ORDER — OXYCODONE-ACETAMINOPHEN 5-325 MG PO TABS: 2.0000 | ORAL_TABLET | ORAL | 0 refills | Status: DC | PRN

## 2015-09-16 MED ORDER — PROMETHAZINE HCL 12.5 MG PO TABS: 12.5000 mg | ORAL_TABLET | ORAL | 0 refills | Status: DC | PRN

## 2015-09-16 MED ORDER — MISOPROSTOL 200 MCG PO TABS: 800.0000 ug | ORAL_TABLET | Freq: Once | ORAL | 1 refills | Status: DC

## 2015-09-16 NOTE — MAU Provider Note (Signed)
History     CSN: [id number]  Arrival date and time: 09/16/15 1405   First Provider Initiated Contact with Patient 09/16/15 1440        Chief Complaint  Patient presents with  . Vaginal Bleeding   HPI  Alicia Obrien is a 40 y.o. [id number] at [redacted]w[redacted]d by LMP who presents with vaginal bleeding. Symptoms began this morning. Reports brown blood in underwear this morning. Pink spotting after that. No bleeding since this morning. Denies abdominal pain, n/v/d, recent intercourse, vaginal discharge, dysuria, or vaginal irritation. Constipation; last BM on Saturday; states this is normal for her while she's taking prenatal vitamins.   OB History    Gravida Para Term Preterm AB Living   5 3 3   1 3    SAB TAB Ectopic Multiple Live Births   1              Past Medical History:  Diagnosis Date  . Elevated cholesterol   . Heart murmur   . Hypertension   . Vertigo     Past Surgical History:  Procedure Laterality Date  . NO PAST SURGERIES      Family History  Problem Relation Age of Onset  . Diabetes Mother   . Cancer Maternal Aunt     breast  . Cancer Maternal Uncle     bone cancer   . Heart disease Maternal Uncle     Social History  Substance Use Topics  . Smoking status: Never Smoker  . Smokeless tobacco: Never Used  . Alcohol use No    Allergies:  Allergies  Allergen Reactions  . Latex Rash    Prescriptions Prior to Admission  Medication Sig Dispense Refill Last Dose  . amLODipine (NORVASC) 5 MG tablet Take 1 tablet (5 mg total) by mouth daily. (Patient not taking: Reported on 07/28/2015) 30 tablet 5 Not Taking  . atorvastatin (LIPITOR) 40 MG tablet Take 1 tablet (40 mg total) by mouth daily. (Patient not taking: Reported on 07/28/2015) 90 tablet 1 Not Taking  . ketoprofen (ORUDIS) 50 MG capsule Take 1 capsule (50 mg total) by mouth every 6 (six) hours as needed. (Patient not taking: Reported on 07/28/2015) 30 capsule 0 Not Taking  . meclizine (ANTIVERT) 25 MG tablet Take  1 tablet (25 mg total) by mouth 3 (three) times daily as needed for dizziness. 30 tablet 0 Taking  . Prenatal Vit-Fe Fumarate-FA (PRENATAL VITAMINS PLUS) 27-1 MG TABS Take by mouth.   Taking  . Prenatal Vit-Fe Fumarate-FA (PRENATAL VITAMINS PLUS) 27-1 MG TABS Take 1 tablet by mouth once. 30 tablet 2 Taking    Review of Systems  Constitutional: Negative.   Gastrointestinal: Positive for constipation. Negative for abdominal pain, blood in stool, diarrhea, nausea and vomiting.  Genitourinary: Negative for dysuria and hematuria.       + vaginal bleeding   Physical Exam   Blood pressure 131/79, pulse 83, temperature 98.8 F (37.1 C), temperature source Oral, resp. rate 18, weight 212 lb 12 oz (96.5 kg), last menstrual period 06/21/2015, unknown if currently breastfeeding.  Physical Exam  Nursing note and vitals reviewed. Constitutional: She is oriented to person, place, and time. She appears well-developed and well-nourished. She appears distressed (pt tearful; states she's worried that she's having a miscarriage).  HENT:  Head: Normocephalic and atraumatic.  Eyes: Conjunctivae are normal. Right eye exhibits no discharge. Left eye exhibits no discharge. No scleral icterus.  Neck: Normal range of motion.  Respiratory: Effort normal. No respiratory distress.  GI: Soft. She exhibits no distension. There is no tenderness. There is no rebound.  Genitourinary: Uterus normal. Cervix exhibits no motion tenderness and no friability. No bleeding in the vagina. Vaginal discharge (small amount of tan frothy discharge) found.  Genitourinary Comments: Cervix closed  Neurological: She is alert and oriented to person, place, and time.  Skin: Skin is warm and dry. She is not diaphoretic.  Psychiatric: She has a normal mood and affect. Her behavior is normal. Judgment and thought content normal.    MAU Course  Procedures Results for orders placed or performed during the hospital encounter of 09/16/15  (from the past 24 hour(s))  Urinalysis, Routine w reflex microscopic (not at [facility])     Status: Abnormal   Collection Time: 09/16/15  2:20 PM  Result Value Ref Range   Color, Urine YELLOW YELLOW   APPearance CLEAR CLEAR   Specific Gravity, Urine 1.025 1.005 - 1.030   pH 6.0 5.0 - 8.0   Glucose, UA NEGATIVE NEGATIVE mg/dL   Hgb urine dipstick SMALL (A) NEGATIVE   Bilirubin Urine NEGATIVE NEGATIVE   Ketones, ur NEGATIVE NEGATIVE mg/dL   Protein, ur NEGATIVE NEGATIVE mg/dL   Nitrite NEGATIVE NEGATIVE   Leukocytes, UA NEGATIVE NEGATIVE  Urine microscopic-add on     Status: Abnormal   Collection Time: 09/16/15  2:20 PM  Result Value Ref Range   Squamous Epithelial / LPF 6-30 (A) NONE SEEN   WBC, UA 0-5 0 - 5 WBC/hpf   RBC / HPF 0-5 0 - 5 RBC/hpf   Bacteria, UA MANY (A) NONE SEEN  Wet prep, genital     Status: Abnormal   Collection Time: 09/16/15  2:50 PM  Result Value Ref Range   Yeast Wet Prep HPF POC NONE SEEN NONE SEEN   Trich, Wet Prep NONE SEEN NONE SEEN   Clue Cells Wet Prep HPF POC PRESENT (A) NONE SEEN   WBC, Wet Prep HPF POC MANY (A) NONE SEEN   Sperm NONE SEEN   CBC     Status: Abnormal   Collection Time: 09/16/15  2:54 PM  Result Value Ref Range   WBC 7.3 4.0 - 10.5 K/uL   RBC 4.05 3.87 - 5.11 MIL/uL   Hemoglobin 11.4 (L) 12.0 - 15.0 g/dL   HCT 33.1 (L) 36.0 - 46.0 %   MCV 81.7 78.0 - 100.0 fL   MCH 28.1 26.0 - 34.0 pg   MCHC 34.4 30.0 - 36.0 g/dL   RDW 14.5 11.5 - 15.5 %   Platelets 193 150 - 400 K/uL  hCG, quantitative, pregnancy     Status: Abnormal   Collection Time: 09/16/15  2:54 PM  Result Value Ref Range   hCG, Beta Chain, Quant, S 3,783 (H) <5 mIU/mL   US Ob Comp Less 14 Wks  Result Date: 09/16/2015 CLINICAL DATA:  Spotting today.  No fetal heart tones. EXAM: OBSTETRIC <14 WK [place] AND TRANSVAGINAL OB US TECHNIQUE: Both transabdominal and transvaginal ultrasound examinations were performed for complete evaluation of the gestation as well as the maternal  uterus, adnexal regions, and pelvic cul-de-sac. Transvaginal technique was performed to assess early pregnancy. COMPARISON:  None. FINDINGS: Intrauterine gestational sac: Single Yolk sac:  Question yolk sac Embryo:  None visualized Cardiac Activity: None visualized Heart Rate:   bpm MSD: 32.4  mm   8 w   3  d CRL:    mm    w    d  [place] EDC: Subchorionic hemorrhage:  Small subchorionic hemorrhage. Maternal uterus/adnexae: No adnexal masses or free fluid. IMPRESSION: Intrauterine gestational sac with estimated gestational age [redacted] weeks 3 days by mean sac diameter. No fetal pole. Findings meet definitive criteria for failed pregnancy. This follows SRU consensus guidelines: Diagnostic Criteria for Nonviable Pregnancy Early in the First Trimester. Alison Stalling J Med [phone]. Electronically Signed   By: Rolm Baptise M.D.   On: 09/16/2015 16:41   US Ob Transvaginal  Result Date: 09/16/2015 CLINICAL DATA:  Spotting today.  No fetal heart tones. EXAM: OBSTETRIC <14 WK [place] AND TRANSVAGINAL OB US TECHNIQUE: Both transabdominal and transvaginal ultrasound examinations were performed for complete evaluation of the gestation as well as the maternal uterus, adnexal regions, and pelvic cul-de-sac. Transvaginal technique was performed to assess early pregnancy. COMPARISON:  None. FINDINGS: Intrauterine gestational sac: Single Yolk sac:  Question yolk sac Embryo:  None visualized Cardiac Activity: None visualized Heart Rate:   bpm MSD: 32.4  mm   8 w   3  d CRL:    mm    w    d                  [place] EDC: Subchorionic hemorrhage:  Small subchorionic hemorrhage. Maternal uterus/adnexae: No adnexal masses or free fluid. IMPRESSION: Intrauterine gestational sac with estimated gestational age [redacted] weeks 3 days by mean sac diameter. No fetal pole. Findings meet definitive criteria for failed pregnancy. This follows SRU consensus guidelines: Diagnostic Criteria for Nonviable Pregnancy Early in the First Trimester. Alison Stalling J Med  [phone]. Electronically Signed   By: Rolm Baptise M.D.   On: 09/16/2015 16:41     MDM Unable to doppler FHT; pt has +HPT in office, no ultrasound or labs prior to today CBC, BHCG, GC/CT, wet prep, ultrasound B positive Ultrasound shows failed pregnancy  Discussed ultrasound results with patient; pt appropriately upset. Declines chaplain services at this time.  Discussed options for management of incomplete AB including expectant management, Cytotec or D&C. Prefers cytotec. Reviewed with pt cytotec procedure.  Pt verbalizes that she lives close to the hospital and has transportation readily available.  Pt appears reliable and verbalizes understanding and agrees with plan of care  Assessment and Plan  A: 1. Missed abortion   2. Fetal heart tones not heard   3. Vaginal bleeding in pregnancy, first trimester     P: Discharge home Rx cytotec, percocet, phenergan Msg to clinic for f/u in 2 weeks Discussed reasons to return to MAU Comfort care pack given  Jorje Guild 09/16/2015, 2:40 PM         Early Intrauterine Pregnancy Failure  _X_  Documented intrauterine pregnancy failure less than or equal to [redacted] weeks gestation  _X_  No serious current illness  _X_  Baseline Hgb greater than or equal to 10g/dl  _X_  Patient has easily accessible transportation to the hospital  _X_  Clear preference  _X_  Practitioner/physician deems patient reliable  _X_  Counseling by practitioner or physician  _X_  Patient education by RN  _n/a__  Rho-Gam given by RN if indicated  ___ Medication dispensed   ___   Cytotec 800 mcg  _X_   Intravaginally by patient at home         __   Intravaginally by RN in MAU        __   Rectally by patient at home        __   Rectally by RN  in MAU  ___  Ibuprofen 600 mg 1 tablet by mouth every 6 hours as needed #30  _X_  Hydrocodone/acetaminophen 5/325 mg by mouth every 4 to 6 hours as needed  _X_  Phenergan 12.5 mg by mouth every 4 hours as  needed for nausea

## 2015-09-16 NOTE — Discharge Instructions (Signed)
FACTS YOU SHOULD KNOW  WHAT IS AN EARLY PREGNANCY FAILURE? Once the egg is fertilized with the sperm and begins to develop, it attaches to the lining of the uterus. This early pregnancy tissue may not develop into an embryo (the beginning stage of a baby). Sometimes an embryo does develop but does not continue to grow. These problems can be seen on ultrasound.   MANAGEMNT OF EARLY PREGNANCY FAILURE: About 4 out of 100 (0.25%) women will have a pregnancy loss in her lifetime.  One in five pregnancies is found to be an early pregnancy failure.  There are 3 ways to care for an early pregnancy failure:   (1) Surgery, (2) Medicine, (3) Waiting for you to pass the pregnancy on your own. The decision as to how to proceed after being diagnosed with and early pregnancy failure is an individual one.  The decision can be made only after appropriate counseling.  You need to weigh the pros and cons of the 3 choices. Then you can make the choice that works for you. SURGERY (D&E) . Procedure over in 1 day . Requires being put to sleep . Bleeding may be light . Possible problems during surgery, including injury to womb(uterus) . Care provider has more control Medicine (CYTOTEC) . The complete procedure may take days to weeks . No Surgery . Bleeding may be heavy at times . There may be drug side effects . Patient has more control Waiting . You may choose to wait, in which case your own body may complete the passing of the abnormal early pregnancy on its own in about 2-4 weeks . Your bleeding may be heavy at times . There is a small possibility that you may need surgery if the bleeding is too much or not all of the pregnancy has passed. CYTOTEC MANAGEMENT Prostaglandins (cytotec) are the most widely used drug for this purpose. They cause the uterus to cramp and contract. You will place the medicine yourself inside your vagina in the privacy of your home. Empting of the uterus should occur within 3 days but  the process may continue for several weeks. The bleeding may seem heavy at times. POSSIBLE SIDE EFFECTS FROM CYTOTEC . Nausea   Vomiting . Diarrhea Fever . Chills  Hot Flashes Side effects  from the process of the early pregnancy failure include: . Cramping  Bleeding . Headaches  Dizziness RISKS: This is a low risk procedure. Less than 1 in 100 women has a complication. An incomplete passage of the early pregnancy may occur. Also, Hemorrhage (heavy bleeding) could happen.  Rarely the pregnancy will not be passed completely. Excessively heavy bleeding may occur.  Your doctor may need to perform surgery to empty the uterus (D&E). Afterwards: Everybody will feel differently after the early pregnancy completion. You may have soreness or cramps for a day or two. You may have soreness or cramps for day or two.  You may have light bleeding for up to 2 weeks. You may be as active as you feel like being. If you have any of the following problems you may call Maternity Admissions Unit at [phone]. . If you have pain that does not get better  with pain medication . Bleeding that soaks through 2 thick full-sized sanitary pads in an hour . Cramps that last longer than 2 days . Foul smelling discharge . Fever above 100.4 degrees F Even if you do not have any of these symptoms, you should have a follow-up exam to make sure you   are healing properly. This appointment will be made for you before you leave the hospital. Your next normal period will start again in 4-6 week after the loss. You can get pregnant soon after the loss, so use birth control right away. Finally: Make sure all your questions are answered before during and after any procedure. Follow up with medical care and family planning methods.     Pelvic Rest Pelvic rest is sometimes recommended for women when:   The placenta is partially or completely covering the opening of the cervix (placenta previa).  There is bleeding between the  uterine wall and the amniotic sac in the first trimester (subchorionic hemorrhage).  The cervix begins to open without labor starting (incompetent cervix, cervical insufficiency).  The labor is too early (preterm labor). HOME CARE INSTRUCTIONS  Do not have sexual intercourse, stimulation, or an orgasm.  Do not use tampons, douche, or put anything in the vagina.  Do not lift anything over 10 pounds (4.5 kg).  Avoid strenuous activity or straining your pelvic muscles. SEEK MEDICAL CARE IF:  You have any vaginal bleeding during pregnancy. Treat this as a potential emergency.  You have cramping pain felt low in the stomach (stronger than menstrual cramps).  You notice vaginal discharge (watery, mucus, or bloody).  You have a low, dull backache.  There are regular contractions or uterine tightening. SEEK IMMEDIATE MEDICAL CARE IF: You have vaginal bleeding and have placenta previa.    This information is not intended to replace advice given to you by your health care provider. Make sure you discuss any questions you have with your health care provider.   Document Released: 04/23/2010 Document Revised: 03/21/2011 Document Reviewed: 06/30/2014 Elsevier Interactive Patient Education [employer].

## 2015-09-16 NOTE — MAU Note (Signed)
Supposed to be 12wks.  Noted bleeding this morning when went to bathroom, has not seen any since.  Was supposed to have first appointment today, was told her ins was inactive and they  Wouldn't see her.denies any pain

## 2015-09-17 LAB — GC/CHLAMYDIA PROBE AMP (~~LOC~~) NOT AT ARMC
Chlamydia: NEGATIVE
Neisseria Gonorrhea: NEGATIVE

## 2015-09-17 LAB — HIV ANTIBODY (ROUTINE TESTING W REFLEX): HIV SCREEN 4TH GENERATION: NONREACTIVE

## 2015-09-29 ENCOUNTER — Ambulatory Visit: Payer: Medicaid Other | Admitting: Obstetrics & Gynecology

## 2015-09-29 ENCOUNTER — Ambulatory Visit: Payer: Medicaid Other | Admitting: Family Medicine

## 2015-10-20 ENCOUNTER — Ambulatory Visit: Payer: Medicaid Other | Admitting: Obstetrics & Gynecology

## 2016-01-15 ENCOUNTER — Emergency Department (HOSPITAL_COMMUNITY): Payer: Self-pay

## 2016-01-15 ENCOUNTER — Encounter (HOSPITAL_COMMUNITY): Payer: Self-pay

## 2016-01-15 ENCOUNTER — Emergency Department (HOSPITAL_COMMUNITY)
Admission: EM | Admit: 2016-01-15 | Discharge: 2016-01-15 | Disposition: A | Discharge status: Home / Self Care | Payer: Self-pay | Attending: Emergency Medicine | Admitting: Emergency Medicine

## 2016-01-15 DIAGNOSIS — Z91040 Latex allergy status: Secondary | ICD-10-CM | POA: Insufficient documentation

## 2016-01-15 DIAGNOSIS — X501XXA Overexertion from prolonged static or awkward postures, initial encounter: Secondary | ICD-10-CM | POA: Insufficient documentation

## 2016-01-15 DIAGNOSIS — Y999 Unspecified external cause status: Secondary | ICD-10-CM | POA: Insufficient documentation

## 2016-01-15 DIAGNOSIS — Y929 Unspecified place or not applicable: Secondary | ICD-10-CM | POA: Insufficient documentation

## 2016-01-15 DIAGNOSIS — M25571 Pain in right ankle and joints of right foot: Secondary | ICD-10-CM | POA: Insufficient documentation

## 2016-01-15 DIAGNOSIS — Y939 Activity, unspecified: Secondary | ICD-10-CM | POA: Insufficient documentation

## 2016-01-15 DIAGNOSIS — I10 Essential (primary) hypertension: Secondary | ICD-10-CM | POA: Insufficient documentation

## 2016-01-15 MED ORDER — IBUPROFEN 800 MG PO TABS: 800.0000 mg | ORAL_TABLET | Freq: Three times a day (TID) | ORAL | 0 refills | Status: DC | PRN

## 2016-01-15 MED ORDER — IBUPROFEN 200 MG PO TABS: 600.0000 mg | ORAL_TABLET | Freq: Once | ORAL | Status: DC

## 2016-01-15 MED ORDER — IBUPROFEN 800 MG PO TABS
800.0000 mg | ORAL_TABLET | Freq: Once | ORAL | Status: AC
Start: 2016-01-15 — End: 2016-01-15
  Administered 2016-01-15: 800 mg via ORAL
  Filled 2016-01-15: qty 1

## 2016-01-15 NOTE — ED Notes (Signed)
Bed: WTR9 Expected date: 01/15/16 Expected time:  Means of arrival:  Comments: CHLOROX 5 MINS (14:16)

## 2016-01-15 NOTE — ED Notes (Signed)
OT paged and will report to bedside.

## 2016-01-15 NOTE — ED Notes (Signed)
Portable xray at bedside.

## 2016-01-15 NOTE — ED Provider Notes (Signed)
[place] DEPT Provider Note   CSN: [id number] Arrival date & time: 01/15/16  1401  By signing my name below, I, Neta Mends, attest that this documentation has been prepared under the direction and in the presence of Reese Stockman, [place]. Electronically Signed: Neta Mends, ED Scribe. 01/15/2016. 2:39 PM.   History   Chief Complaint Chief Complaint  Patient presents with  . Ankle Pain   The history is provided by the patient. No language interpreter was used.   HPI Comments:  Alicia Obrien is a 41 y.o. female who presents to the Emergency Department s/p right ankle injury that occurred PTA. Pt reports that she took a step down a stair on top a flat surface and heard a pop. Pt locates the pain at the bottom of her right foot. Pt complains of associated swelling and intermittent tingling to the area. Pt reports that she has broken her right ankle previously. Pt has not been ambulatory on her right foot and is in a wheelchair currently. Pt denies any other injuries/trauma, no known allergies to medication. No alleviating factors noted. Pt denies other associated symptoms.  Denies any other injury.    Past Medical History:  Diagnosis Date  . Elevated cholesterol   . Heart murmur   . Hypertension   . Vertigo     Patient Active Problem List   Diagnosis Date Noted  . Vertigo 03/27/2015  . Obesity 03/27/2015  . Essential hypertension 01/28/2015  . History of ovarian cyst 01/28/2015  . Hyperlipidemia 01/28/2015    Past Surgical History:  Procedure Laterality Date  . NO PAST SURGERIES      OB History    Gravida Para Term Preterm AB Living   5 3 3   1 3    SAB TAB Ectopic Multiple Live Births   1               Home Medications    Prior to Admission medications   Medication Sig Start Date End Date Taking? Authorizing Provider  amLODipine (NORVASC) 5 MG tablet Take 1 tablet (5 mg total) by mouth daily. Patient not taking: Reported on 07/28/2015 03/27/15    Dorena Dew, FNP  atorvastatin (LIPITOR) 40 MG tablet Take 1 tablet (40 mg total) by mouth daily. Patient not taking: Reported on 07/28/2015 06/30/15   Dorena Dew, FNP  ibuprofen (ADVIL,MOTRIN) 800 MG tablet Take 1 tablet (800 mg total) by mouth every 8 (eight) hours as needed for mild pain or moderate pain. 01/15/16   Clayton Bibles, PA-C  ketoprofen (ORUDIS) 50 MG capsule Take 1 capsule (50 mg total) by mouth every 6 (six) hours as needed. Patient not taking: Reported on 07/28/2015 06/29/15   Dorena Dew, FNP  meclizine (ANTIVERT) 25 MG tablet Take 1 tablet (25 mg total) by mouth 3 (three) times daily as needed for dizziness. Patient not taking: Reported on 09/16/2015 03/27/15   Dorena Dew, FNP  misoprostol (CYTOTEC) 200 MCG tablet Take 4 tablets (800 mcg total) by mouth once. If no result after 48 hours, refill medication & take 2nd dose 09/16/15 09/16/15  Jorje Guild, NP  oxyCODONE-acetaminophen (PERCOCET/ROXICET) 5-325 MG tablet Take 2 tablets by mouth every 4 (four) hours as needed for severe pain. 09/16/15   Jorje Guild, NP  Prenatal Vit-Fe Fumarate-FA (PRENATAL VITAMINS PLUS) 27-1 MG TABS Take 1 tablet by mouth once. Patient taking differently: Take 1 tablet by mouth daily at 12 noon.  07/28/15   Gwen Pounds, CNM  promethazine (PHENERGAN) 12.5 MG tablet Take 1 tablet (12.5 mg total) by mouth every 4 (four) hours as needed for nausea or vomiting. 09/16/15   Jorje Guild, NP    Family History Family History  Problem Relation Age of Onset  . Diabetes Mother   . Cancer Maternal Aunt     breast  . Cancer Maternal Uncle     bone cancer   . Heart disease Maternal Uncle     Social History Social History  Substance Use Topics  . Smoking status: Never Smoker  . Smokeless tobacco: Never Used  . Alcohol use No     Allergies   Latex   Review of Systems Review of Systems  Constitutional: Negative for chills and fever.  Musculoskeletal: Positive for arthralgias, gait  problem and joint swelling.  Skin: Negative for color change, pallor and wound.  Allergic/Immunologic: Negative for immunocompromised state.  Neurological: Positive for numbness. Negative for weakness.  Hematological: Does not bruise/bleed easily.  Psychiatric/Behavioral: Negative for self-injury.     Physical Exam Updated Vital Signs BP 135/82 (BP Location: Left Arm)   Pulse 90   Temp 98 F (36.7 C) (Oral)   Resp 18   LMP 12/14/2015   SpO2 100%   Breastfeeding? Unknown   Physical Exam  Constitutional: She appears well-developed and well-nourished. No distress.  HENT:  Head: Normocephalic and atraumatic.  Neck: Neck supple.  Pulmonary/Chest: Effort normal.  Musculoskeletal:       Right knee: Normal.       Right ankle: She exhibits swelling. She exhibits no ecchymosis, no deformity and normal pulse. Tenderness. Lateral malleolus tenderness found.       Right lower leg: Normal.       Right foot: There is tenderness and bony tenderness. There is normal range of motion, no swelling, normal capillary refill, no crepitus, no deformity and no laceration.  RLE: Edema and tenderness to palpation over right lateral malleolus and base of 5th metatarsal. No other focal bony tenderness. Distal sensation and pulses intact. No break in skin. Calf soft nontender.    Neurological: She is alert.  Skin: She is not diaphoretic.  Nursing note and vitals reviewed.    ED Treatments / Results  DIAGNOSTIC STUDIES:  Oxygen Saturation is 100% on RA, normal by my interpretation.    COORDINATION OF CARE:  2:39 PM Discussed treatment plan with pt at bedside and pt agreed to plan.   Labs (all labs ordered are listed, but only abnormal results are displayed) Labs Reviewed - No data to display  EKG  EKG Interpretation None       Radiology Dg Ankle Complete Right  Result Date: 01/15/2016 CLINICAL DATA:  Right ankle twisting injury this morning with onset of pain and swelling. Initial  encounter. EXAM: RIGHT ANKLE - COMPLETE 3+ VIEW COMPARISON:  None. FINDINGS: Soft tissue swelling is seen about the lateral malleolus. No underlying acute bony or joint abnormality. Tiny well corticated ossicle off the medial malleolus is noted. There is a small tibiotalar joint effusion. IMPRESSION: Lateral soft tissue swelling without underlying fracture. Small tibiotalar joint effusion. Electronically Signed   By: Inge Rise M.D.   On: 01/15/2016 15:20   Dg Foot Complete Right  Result Date: 01/15/2016 CLINICAL DATA:  Injury. EXAM: RIGHT FOOT COMPLETE - 3+ VIEW COMPARISON:  No recent prior . FINDINGS: No acute bony or joint abnormality identified. No evidence of fracture or dislocation. IMPRESSION: No acute or focal abnormality. Electronically Signed   By: Marcello Moores  Register  On: 01/15/2016 15:44    Procedures Procedures (including critical care time)  Medications Ordered in ED Medications  ibuprofen (ADVIL,MOTRIN) tablet 800 mg (800 mg Oral Given 01/15/16 1458)     Initial Impression / Assessment and Plan / ED Course  I have reviewed the triage vital signs and the nursing notes.  Pertinent labs & imaging results that were available during my care of the patient were reviewed by me and considered in my medical decision making (see chart for details).  Clinical Course     Afebrile, nontoxic patient with injury to her right ankle while inverting her ankle coming down stairs.   Xray negative.  ASO, crutches given.   D/C home with symptomatic treatment, PCP follow up.   Discussed result, findings, treatment, and follow up  with patient.  Pt given return precautions.  Pt verbalizes understanding and agrees with plan.      Final Clinical Impressions(s) / ED Diagnoses   Final diagnoses:  Acute right ankle pain    New Prescriptions Discharge Medication List as of 01/15/2016  4:08 PM    START taking these medications   Details  ibuprofen (ADVIL,MOTRIN) 800 MG tablet Take 1 tablet (800  mg total) by mouth every 8 (eight) hours as needed for mild pain or moderate pain., Starting Fri 01/15/2016, Print        I personally performed the services described in this documentation, which was scribed in my presence. The recorded information has been reviewed and is accurate.     Clayton Bibles, PA-C 01/15/16 [employer], MD 01/17/16 1057

## 2016-01-15 NOTE — Discharge Instructions (Signed)
Read the information below.  Use the prescribed medication as directed.  Please discuss all new medications with your pharmacist.  You may return to the Emergency Department at any time for worsening condition or any new symptoms that concern you.   If you develop uncontrolled pain, weakness or numbness of the extremity, severe discoloration of the skin, or you are unable to move your foot, return to the ER for a recheck.

## 2016-01-15 NOTE — ED Triage Notes (Signed)
Pt stepped off step and heard a pop.  Rt ankle hurting to bottom of foot.

## 2016-06-07 ENCOUNTER — Encounter (HOSPITAL_COMMUNITY): Payer: Self-pay | Admitting: Emergency Medicine

## 2016-06-07 ENCOUNTER — Emergency Department (HOSPITAL_COMMUNITY)
Admission: EM | Admit: 2016-06-07 | Discharge: 2016-06-07 | Disposition: A | Discharge status: Home / Self Care | Payer: Medicaid Other | Attending: Emergency Medicine | Admitting: Emergency Medicine

## 2016-06-07 DIAGNOSIS — Z23 Encounter for immunization: Secondary | ICD-10-CM | POA: Insufficient documentation

## 2016-06-07 DIAGNOSIS — Y999 Unspecified external cause status: Secondary | ICD-10-CM | POA: Insufficient documentation

## 2016-06-07 DIAGNOSIS — Y929 Unspecified place or not applicable: Secondary | ICD-10-CM | POA: Insufficient documentation

## 2016-06-07 DIAGNOSIS — W503XXA Accidental bite by another person, initial encounter: Secondary | ICD-10-CM | POA: Insufficient documentation

## 2016-06-07 DIAGNOSIS — S81851A Open bite, right lower leg, initial encounter: Secondary | ICD-10-CM | POA: Insufficient documentation

## 2016-06-07 DIAGNOSIS — Y939 Activity, unspecified: Secondary | ICD-10-CM | POA: Insufficient documentation

## 2016-06-07 DIAGNOSIS — Z79899 Other long term (current) drug therapy: Secondary | ICD-10-CM | POA: Insufficient documentation

## 2016-06-07 DIAGNOSIS — I10 Essential (primary) hypertension: Secondary | ICD-10-CM | POA: Insufficient documentation

## 2016-06-07 DIAGNOSIS — Z91040 Latex allergy status: Secondary | ICD-10-CM | POA: Insufficient documentation

## 2016-06-07 MED ORDER — AMOXICILLIN-POT CLAVULANATE 875-125 MG PO TABS: 1.0000 | ORAL_TABLET | Freq: Two times a day (BID) | ORAL | 0 refills | Status: DC

## 2016-06-07 MED ORDER — TETANUS-DIPHTH-ACELL PERTUSSIS 5-2.5-18.5 LF-MCG/0.5 IM SUSP
0.5000 mL | Freq: Once | INTRAMUSCULAR | Status: AC
  Administered 2016-06-07: 0.5 mL via INTRAMUSCULAR
  Filled 2016-06-07: qty 0.5
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
## 2016-06-07 NOTE — Discharge Instructions (Signed)
If you develop redness, drainage, fevers or any new or worsening symptoms around the bite you need to be reevaluated.

## 2016-08-09 ENCOUNTER — Emergency Department (HOSPITAL_COMMUNITY): Payer: Self-pay

## 2016-08-09 ENCOUNTER — Emergency Department (HOSPITAL_COMMUNITY)
Admission: EM | Admit: 2016-08-09 | Discharge: 2016-08-09 | Disposition: A | Discharge status: Home / Self Care | Payer: Self-pay | Attending: Emergency Medicine | Admitting: Emergency Medicine

## 2016-08-09 ENCOUNTER — Encounter (HOSPITAL_COMMUNITY): Payer: Self-pay | Admitting: Emergency Medicine

## 2016-08-09 DIAGNOSIS — M25521 Pain in right elbow: Secondary | ICD-10-CM | POA: Insufficient documentation

## 2016-08-09 DIAGNOSIS — I10 Essential (primary) hypertension: Secondary | ICD-10-CM | POA: Insufficient documentation

## 2016-08-09 DIAGNOSIS — E785 Hyperlipidemia, unspecified: Secondary | ICD-10-CM | POA: Insufficient documentation

## 2016-08-09 DIAGNOSIS — Z91040 Latex allergy status: Secondary | ICD-10-CM | POA: Insufficient documentation

## 2016-08-09 LAB — POC URINE PREG, ED: Preg Test, Ur: NEGATIVE

## 2016-08-09 MED ORDER — ACETAMINOPHEN 500 MG PO TABS: 500.0000 mg | ORAL_TABLET | Freq: Four times a day (QID) | ORAL | 0 refills | Status: DC | PRN

## 2016-08-09 MED ORDER — NAPROXEN 500 MG PO TABS: 500.0000 mg | ORAL_TABLET | Freq: Two times a day (BID) | ORAL | 0 refills | Status: DC
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
## 2016-08-09 NOTE — Discharge Instructions (Signed)
Medications:  Naprosyn, Tylenol  Treatment: Take Naprosyn twice daily as prescribed. Do not take ibuprofen with this medication. Alternate with Tylenol every 6 hours as prescribed. Use ice 3-4 times daily alternating 20 minutes on, 20 minutes off. Wear your brace for support at all times, as needed.  Follow-up: Please follow-up with the orthopedic doctor, Dr. Stann Mainland, as soon as possible for further evaluation and treatment of your injury. Please return to the emergency department if you develop any new or worsening symptoms, such as complete numbness in your arm or hand, color change, or any other concerning symptoms.

## 2016-08-09 NOTE — ED Notes (Signed)
Reviewed instructions with pt. She verbalized understanding
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
## 2016-08-09 NOTE — ED Notes (Signed)
Pt. Has her own sleeve on the rt. Elbow for  comfort
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
## 2016-08-27 ENCOUNTER — Encounter (HOSPITAL_COMMUNITY): Payer: Self-pay

## 2016-08-27 ENCOUNTER — Inpatient Hospital Stay (HOSPITAL_COMMUNITY)
Admission: AD | Admit: 2016-08-27 | Discharge: 2016-08-27 | Disposition: A | Discharge status: Home / Self Care | Payer: Medicaid Other | Source: Ambulatory Visit | Attending: Obstetrics and Gynecology | Admitting: Obstetrics and Gynecology

## 2016-08-27 DIAGNOSIS — Z3202 Encounter for pregnancy test, result negative: Secondary | ICD-10-CM

## 2016-08-27 DIAGNOSIS — R350 Frequency of micturition: Secondary | ICD-10-CM

## 2016-08-27 DIAGNOSIS — N912 Amenorrhea, unspecified: Secondary | ICD-10-CM | POA: Insufficient documentation

## 2016-08-27 DIAGNOSIS — I10 Essential (primary) hypertension: Secondary | ICD-10-CM

## 2016-08-27 DIAGNOSIS — Z91040 Latex allergy status: Secondary | ICD-10-CM | POA: Insufficient documentation

## 2016-08-27 DIAGNOSIS — N911 Secondary amenorrhea: Secondary | ICD-10-CM

## 2016-08-27 LAB — URINALYSIS, ROUTINE W REFLEX MICROSCOPIC
Bilirubin Urine: NEGATIVE
GLUCOSE, UA: NEGATIVE mg/dL
Hgb urine dipstick: NEGATIVE
Ketones, ur: NEGATIVE mg/dL
Leukocytes, UA: NEGATIVE
NITRITE: NEGATIVE
PH: 5 (ref 5.0–8.0)
Protein, ur: NEGATIVE mg/dL
SPECIFIC GRAVITY, URINE: 1.009 (ref 1.005–1.030)

## 2016-08-27 LAB — POCT PREGNANCY, URINE: Preg Test, Ur: NEGATIVE

## 2016-08-27 MED ORDER — AMLODIPINE BESYLATE 5 MG PO TABS: 5.0000 mg | ORAL_TABLET | Freq: Every day | ORAL | 1 refills | Status: DC

## 2016-08-27 NOTE — MAU Note (Signed)
Peeing a lot. No period in 2 months, since May. No bleeding. Low abd mild cramping for 2 weeks. Nausea for over a week. Think I have a UTI.

## 2016-08-27 NOTE — MAU Provider Note (Signed)
History     CSN: [id number]  Arrival date and time: 08/27/16 0040   First Provider Initiated Contact with Patient 08/27/16 0113      Chief Complaint  Patient presents with  . Abdominal Pain  . Urinary Frequency  . Nausea   Non-pregnant female here with polyuria. Sx started about 3 weeks ago. No dysuria, urgency, or hematuria. No back pain or fever. No new partner. No vaginal discharge. Prior to her sx she took 2 car trips to [place] in which she was drinking a lot of coffee. She has hx of [employer] but ran out of refills of Norvasc.   Past Medical History:  Diagnosis Date  . Elevated cholesterol   . Heart murmur   . Hypertension   . Vertigo     Past Surgical History:  Procedure Laterality Date  . NO PAST SURGERIES      Family History  Problem Relation Age of Onset  . Diabetes Mother   . Cancer Maternal Aunt        breast  . Cancer Maternal Uncle        bone cancer   . Heart disease Maternal Uncle     Social History  Substance Use Topics  . Smoking status: Never Smoker  . Smokeless tobacco: Never Used  . Alcohol use No    Allergies:  Allergies  Allergen Reactions  . Latex Rash    Prescriptions Prior to Admission  Medication Sig Dispense Refill Last Dose  . acetaminophen (TYLENOL) 500 MG tablet Take 1 tablet (500 mg total) by mouth every 6 (six) hours as needed. 30 tablet 0 Past Month at Unknown time  . amoxicillin-clavulanate (AUGMENTIN) 875-125 MG tablet Take 1 tablet by mouth every 12 (twelve) hours. 10 tablet 0   . atorvastatin (LIPITOR) 40 MG tablet Take 1 tablet (40 mg total) by mouth daily. (Patient not taking: Reported on 07/28/2015) 90 tablet 1 Not Taking  . ibuprofen (ADVIL,MOTRIN) 800 MG tablet Take 1 tablet (800 mg total) by mouth every 8 (eight) hours as needed for mild pain or moderate pain. 15 tablet 0   . ketoprofen (ORUDIS) 50 MG capsule Take 1 capsule (50 mg total) by mouth every 6 (six) hours as needed. (Patient not taking: Reported on 07/28/2015) 30  capsule 0 Not Taking  . meclizine (ANTIVERT) 25 MG tablet Take 1 tablet (25 mg total) by mouth 3 (three) times daily as needed for dizziness. (Patient not taking: Reported on 09/16/2015) 30 tablet 0 Not Taking at Unknown time  . misoprostol (CYTOTEC) 200 MCG tablet Take 4 tablets (800 mcg total) by mouth once. If no result after 48 hours, refill medication & take 2nd dose 4 tablet 1   . naproxen (NAPROSYN) 500 MG tablet Take 1 tablet (500 mg total) by mouth 2 (two) times daily. 30 tablet 0   . oxyCODONE-acetaminophen (PERCOCET/ROXICET) 5-325 MG tablet Take 2 tablets by mouth every 4 (four) hours as needed for severe pain. 10 tablet 0   . Prenatal Vit-Fe Fumarate-FA (PRENATAL VITAMINS PLUS) 27-1 MG TABS Take 1 tablet by mouth once. (Patient taking differently: Take 1 tablet by mouth daily at 12 noon. ) 30 tablet 2 Past Week at Unknown time  . promethazine (PHENERGAN) 12.5 MG tablet Take 1 tablet (12.5 mg total) by mouth every 4 (four) hours as needed for nausea or vomiting. 30 tablet 0   . [DISCONTINUED] amLODipine (NORVASC) 5 MG tablet Take 1 tablet (5 mg total) by mouth daily. (Patient not taking: Reported on  07/28/2015) 30 tablet 5 Not Taking    Review of Systems  Constitutional: Negative for fever.  Gastrointestinal: Positive for nausea and vomiting. Negative for abdominal pain.  Genitourinary: Positive for frequency. Negative for dysuria, hematuria, urgency and vaginal discharge.  Musculoskeletal: Negative for back pain.   Physical Exam   Blood pressure (!) 144/94, pulse 83, temperature 98.3 F (36.8 C), temperature source Oral, resp. rate 16, height 5\' 9"  (1.753 m), weight 224 lb (101.6 kg), last menstrual period 06/06/2016, SpO2 99 %, unknown if currently breastfeeding.  Physical Exam  Nursing note and vitals reviewed. Constitutional: She is oriented to person, place, and time. She appears well-developed and well-nourished. No distress.  HENT:  Head: Normocephalic and atraumatic.  Neck:  Normal range of motion.  Cardiovascular: Normal rate.   Respiratory: Effort normal. No respiratory distress.  GI: Soft. She exhibits no distension and no mass. There is no tenderness. There is no rebound and no guarding.  Musculoskeletal: Normal range of motion.  Neurological: She is alert and oriented to person, place, and time.  Skin: Skin is warm and dry.  Psychiatric: She has a normal mood and affect.   Results for orders placed or performed during the hospital encounter of 08/27/16 (from the past 24 hour(s))  Urinalysis, Routine w reflex microscopic     Status: Abnormal   Collection Time: 08/27/16 12:50 AM  Result Value Ref Range   Color, Urine STRAW (A) YELLOW   APPearance CLEAR CLEAR   Specific Gravity, Urine 1.009 1.005 - 1.030   pH 5.0 5.0 - 8.0   Glucose, UA NEGATIVE NEGATIVE mg/dL   Hgb urine dipstick NEGATIVE NEGATIVE   Bilirubin Urine NEGATIVE NEGATIVE   Ketones, ur NEGATIVE NEGATIVE mg/dL   Protein, ur NEGATIVE NEGATIVE mg/dL   Nitrite NEGATIVE NEGATIVE   Leukocytes, UA NEGATIVE NEGATIVE  Pregnancy, urine POC     Status: None   Collection Time: 08/27/16  1:17 AM  Result Value Ref Range   Preg Test, Ur NEGATIVE NEGATIVE   MAU Course  Procedures  MDM Labs ordered and reviewed. No evidence of UTI or pregnancy. Will give Rx for HTN and have her f/u with PCP. Stable for discharge home.  Assessment and Plan   1. Essential hypertension   2. Urinary frequency   3. Amenorrhea, secondary   4. Pregnancy examination or test, negative result    Discharge home Follow up at [facility]  Return to MAU for OBGYN emergencies  Allergies as of 08/27/2016      Reactions   Latex Rash      Medication List    STOP taking these medications   amoxicillin-clavulanate 875-125 MG tablet Commonly known as:  AUGMENTIN   ibuprofen 800 MG tablet Commonly known as:  ADVIL,MOTRIN   misoprostol 200 MCG tablet Commonly known as:  CYTOTEC   naproxen 500 MG  tablet Commonly known as:  NAPROSYN   oxyCODONE-acetaminophen 5-325 MG tablet Commonly known as:  PERCOCET/ROXICET   promethazine 12.5 MG tablet Commonly known as:  PHENERGAN     TAKE these medications   acetaminophen 500 MG tablet Commonly known as:  TYLENOL Take 1 tablet (500 mg total) by mouth every 6 (six) hours as needed.   amLODipine 5 MG tablet Commonly known as:  NORVASC Take 1 tablet (5 mg total) by mouth daily.   atorvastatin 40 MG tablet Commonly known as:  LIPITOR Take 1 tablet (40 mg total) by mouth daily.   ketoprofen 50 MG capsule Commonly known as:  ORUDIS Take 1 capsule (50 mg total) by mouth every 6 (six) hours as needed.   meclizine 25 MG tablet Commonly known as:  ANTIVERT Take 1 tablet (25 mg total) by mouth 3 (three) times daily as needed for dizziness.   PRENATAL VITAMINS PLUS 27-1 MG Tabs Take 1 tablet by mouth once. What changed:  when to take this      Julianne Handler, CNM 08/27/2016, 2:08 AM

## 2016-08-27 NOTE — Discharge Instructions (Signed)
Secondary Amenorrhea Secondary amenorrhea is the stopping of menstrual flow for 3-6 months in a female who has previously had periods. There are many possible causes. Most of these causes are not serious. Usually, treating the underlying problem causing the loss of menses will return your periods to normal. What are the causes? Some common and uncommon causes of not menstruating include:  Malnutrition.  Low blood sugar (hypoglycemia).  Polycystic ovary disease.  Stress or fear.  Breastfeeding.  Hormone imbalance.  Ovarian failure.  Medicines.  Extreme obesity.  Cystic fibrosis.  Low body weight or drastic weight reduction from any cause.  Early menopause.  Removal of ovaries or uterus.  Contraceptives.  Illness.  Long-term (chronic) illnesses.  Cushing syndrome.  Thyroid problems.  Birth control pills, patches, or vaginal rings for birth control.  What increases the risk? You may be at greater risk of secondary amenorrhea if:  You have a family history of this condition.  You have an eating disorder.  You do athletic training.  How is this diagnosed? A diagnosis is made by your health care provider taking a medical history and doing a physical exam. This will include a pelvic exam to check for problems with your reproductive organs. Pregnancy must be ruled out. Often, numerous blood tests are done to measure different hormones in the body. Urine testing may be done. Specialized exams (ultrasound, CT scan, MRI, or hysteroscopy) may have to be done as well as measuring the body mass index (BMI). How is this treated? Treatment depends on the cause of the amenorrhea. If an eating disorder is present, this can be treated with an adequate diet and therapy. Chronic illnesses may improve with treatment of the illness. Amenorrhea may be corrected with medicines, lifestyle changes, or surgery. If the amenorrhea cannot be corrected, it is sometimes possible to create a  false menstruation with medicines. Follow these instructions at home:  Maintain a healthy diet.  Manage weight problems.  Exercise regularly but not excessively.  Get adequate sleep.  Manage stress.  Be aware of changes in your menstrual cycle. Keep a record of when your periods occur. Note the date your period starts, how long it lasts, and any problems. Contact a health care provider if: Your symptoms do not get better with treatment. This information is not intended to replace advice given to you by your health care provider. Make sure you discuss any questions you have with your health care provider. Document Released: 02/07/2006 Document Revised: 06/04/2015 Document Reviewed: 06/14/2012 Elsevier Interactive Patient Education  2018 [employer]. Hypertension Hypertension is another name for high blood pressure. High blood pressure forces your heart to work harder to pump blood. This can cause problems over time. There are two numbers in a blood pressure reading. There is a top number (systolic) over a bottom number (diastolic). It is best to have a blood pressure below 120/80. Healthy choices can help lower your blood pressure. You may need medicine to help lower your blood pressure if:  Your blood pressure cannot be lowered with healthy choices.  Your blood pressure is higher than 130/80.  Follow these instructions at home: Eating and drinking  If directed, follow the DASH eating plan. This diet includes: ? Filling half of your plate at each meal with fruits and vegetables. ? Filling one quarter of your plate at each meal with whole grains. Whole grains include whole wheat pasta, brown rice, and whole grain bread. ? Eating or drinking low-fat dairy products, such as skim milk  or low-fat yogurt. ? Filling one quarter of your plate at each meal with low-fat (lean) proteins. Low-fat proteins include fish, skinless chicken, eggs, beans, and tofu. ? Avoiding fatty meat, cured and  processed meat, or chicken with skin. ? Avoiding premade or processed food.  Eat less than 1,500 mg of salt (sodium) a day.  Limit alcohol use to no more than 1 drink a day for nonpregnant women and 2 drinks a day for men. One drink equals 12 oz of beer, 5 oz of wine, or 1 oz of hard liquor. Lifestyle  Work with your doctor to stay at a healthy weight or to lose weight. Ask your doctor what the best weight is for you.  Get at least 30 minutes of exercise that causes your heart to beat faster (aerobic exercise) most days of the week. This may include walking, swimming, or biking.  Get at least 30 minutes of exercise that strengthens your muscles (resistance exercise) at least 3 days a week. This may include lifting weights or pilates.  Do not use any products that contain nicotine or tobacco. This includes cigarettes and e-cigarettes. If you need help quitting, ask your doctor.  Check your blood pressure at home as told by your doctor.  Keep all follow-up visits as told by your doctor. This is important. Medicines  Take over-the-counter and prescription medicines only as told by your doctor. Follow directions carefully.  Do not skip doses of blood pressure medicine. The medicine does not work as well if you skip doses. Skipping doses also puts you at risk for problems.  Ask your doctor about side effects or reactions to medicines that you should watch for. Contact a doctor if:  You think you are having a reaction to the medicine you are taking.  You have headaches that keep coming back (recurring).  You feel dizzy.  You have swelling in your ankles.  You have trouble with your vision. Get help right away if:  You get a very bad headache.  You start to feel confused.  You feel weak or numb.  You feel faint.  You get very bad pain in your: ? Chest. ? Belly (abdomen).  You throw up (vomit) more than once.  You have trouble breathing. Summary  Hypertension is  another name for high blood pressure.  Making healthy choices can help lower blood pressure. If your blood pressure cannot be controlled with healthy choices, you may need to take medicine. This information is not intended to replace advice given to you by your health care provider. Make sure you discuss any questions you have with your health care provider. Document Released: 06/15/2007 Document Revised: 11/25/2015 Document Reviewed: 11/25/2015 Elsevier Interactive Patient Education  [employer].

## 2016-11-22 ENCOUNTER — Other Ambulatory Visit: Payer: Self-pay | Admitting: Certified Nurse Midwife

## 2016-11-22 DIAGNOSIS — I10 Essential (primary) hypertension: Secondary | ICD-10-CM

## 2017-04-25 ENCOUNTER — Encounter (HOSPITAL_COMMUNITY): Payer: Self-pay | Admitting: Emergency Medicine

## 2017-04-25 ENCOUNTER — Ambulatory Visit (HOSPITAL_COMMUNITY)
Admission: EM | Admit: 2017-04-25 | Discharge: 2017-04-25 | Disposition: A | Discharge status: Home / Self Care | Payer: Self-pay | Attending: Family Medicine | Admitting: Family Medicine

## 2017-04-25 DIAGNOSIS — I10 Essential (primary) hypertension: Secondary | ICD-10-CM

## 2017-04-25 LAB — POCT I-STAT, CHEM 8
BUN: 9 mg/dL (ref 6–20)
Calcium, Ion: 1.19 mmol/L (ref 1.15–1.40)
Chloride: 104 mmol/L (ref 101–111)
Creatinine, Ser: 0.6 mg/dL (ref 0.44–1.00)
Glucose, Bld: 108 mg/dL — ABNORMAL HIGH (ref 65–99)
HEMATOCRIT: 37 % (ref 36.0–46.0)
Hemoglobin: 12.6 g/dL (ref 12.0–15.0)
POTASSIUM: 4.1 mmol/L (ref 3.5–5.1)
SODIUM: 140 mmol/L (ref 135–145)
TCO2: 27 mmol/L (ref 22–32)

## 2017-04-25 MED ORDER — AMLODIPINE BESYLATE 5 MG PO TABS: 5.0000 mg | ORAL_TABLET | Freq: Every day | ORAL | 3 refills | Status: DC

## 2017-04-25 NOTE — ED Triage Notes (Signed)
Pt here for HA x weeks; pt sts feeling funny x 3 days; pt appears anxious at present; pt sts no htn meds x months

## 2017-04-25 NOTE — ED Provider Notes (Signed)
[facility]   [id number] 04/25/17 Arrival Time: 4098   SUBJECTIVE:  Alicia Obrien is a 42 y.o. female who presents to the urgent care with complaint of HA x weeks; pt sts feeling funny x 3 days; pt appears anxious at present; pt sts no htn meds x months  Patient had difficulty expressing herself this morning, trying to find the right word at times.  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 Elevated cholesterol   . Heart murmur   . Hypertension   . Vertigo    Family History  Problem Relation Age of Onset  . Diabetes Mother   . Cancer Maternal Aunt        breast  . Cancer Maternal Uncle        bone cancer   . Heart disease Maternal Uncle    Social History   Socioeconomic History  . Marital status: Single    Spouse name: Not on file  . Number of children: Not on file  . Years of education: Not on file  . Highest education level: Not on file  Occupational History  . Not on file  Social Needs  . Financial resource strain: Not on file  . Food insecurity:    Worry: Not on file    Inability: Not on file  . Transportation needs:    Medical: Not on file    Non-medical: Not on file  Tobacco Use  . Smoking status: Never Smoker  . Smokeless tobacco: Never Used  Substance and Sexual Activity  . Alcohol use: No  . Drug use: No  . Sexual activity: Yes  Lifestyle  . Physical activity:    Days per week: Not on file    Minutes per session: Not on file  . Stress: Not on file  Relationships  . Social connections:    Talks on phone: Not on file    Gets together: Not on file    Attends religious service: Not on file    Active member of club or organization: Not on file    Attends meetings of clubs or organizations: Not on file    Relationship status: Not on file  . Intimate partner violence:    Fear of current or ex partner: Not  on file    Emotionally abused: Not on file    Physically abused: Not on file    Forced sexual activity: Not on file  Other Topics Concern  . Not on file  Social History Narrative  . Not on file   No outpatient medications have been marked as taking for the 04/25/17 encounter [facility] Encounter).   Allergies  Allergen Reactions  . Latex Rash      ROS: As per HPI, remainder of ROS negative.   OBJECTIVE:   Vitals:   04/25/17 1131  BP: (!) 154/118  Pulse: 76  Resp: 18  Temp: 98.1 F (36.7 C)  TempSrc: Oral  SpO2: 100%     General appearance: alert; no distress Eyes: PERRL; EOMI; conjunctiva normal HENT: normocephalic; atraumatic; TMs normal, canal normal, external ears normal without trauma; nasal mucosa normal; oral mucosa normal Neck: supple; no bruit or thyromegaly Lungs: clear to auscultation bilaterally Heart: regular rate and rhythm Back: no CVA tenderness Extremities: no cyanosis or edema; symmetrical with no gross deformities Skin: warm and dry Neurologic: normal gait; grossly normal cranial nerves, normal  speech pattern and vocabulary, moving four extremities with equal strength, no tremor or dysmetria Psychological: alert and cooperative; normal mood and affect      Labs:  Results for orders placed or performed during the hospital encounter of 04/25/17  I-STAT, chem 8  Result Value Ref Range   Sodium 140 135 - 145 mmol/L   Potassium 4.1 3.5 - 5.1 mmol/L   Chloride 104 101 - 111 mmol/L   BUN 9 6 - 20 mg/dL   Creatinine, Ser 0.60 0.44 - 1.00 mg/dL   Glucose, Bld 108 (H) 65 - 99 mg/dL   Calcium, Ion 1.19 1.15 - 1.40 mmol/L   TCO2 27 22 - 32 mmol/L   Hemoglobin 12.6 12.0 - 15.0 g/dL   HCT 37.0 36.0 - 46.0 %    Labs Reviewed  POCT I-STAT, CHEM 8 - Abnormal; Notable for the following components:      Result Value   Glucose, Bld 108 (*)    All other components within normal limits    No results found.     ASSESSMENT & PLAN:  1.  Essential hypertension   Your lab tests are normal and your neurological exam is now normal.  I believe your symptoms stem from an elevated blood pressure which should respond to restarting your medication.  Please go to the emergency room directly if symptoms return  Meds ordered this encounter  Medications  . amLODipine (NORVASC) 5 MG tablet    Sig: Take 1 tablet (5 mg total) by mouth daily.    Dispense:  90 tablet    Refill:  3    Reviewed expectations re: course of current medical issues. Questions answered. Outlined signs and symptoms indicating need for more acute intervention. Patient verbalized understanding. After Visit Summary given.    Procedures:      Robyn Haber, MD 04/25/17 1213

## 2017-04-25 NOTE — Discharge Instructions (Signed)
Your lab tests are normal and your neurological exam is now normal.  I believe your symptoms stem from an elevated blood pressure which should respond to restarting your medication.  Please go to the emergency room directly if symptoms return

## 2017-04-28 DIAGNOSIS — I10 Essential (primary) hypertension: Secondary | ICD-10-CM | POA: Insufficient documentation

## 2017-04-28 DIAGNOSIS — Z91040 Latex allergy status: Secondary | ICD-10-CM | POA: Insufficient documentation

## 2017-04-28 DIAGNOSIS — Z79899 Other long term (current) drug therapy: Secondary | ICD-10-CM | POA: Insufficient documentation

## 2017-04-28 DIAGNOSIS — G43909 Migraine, unspecified, not intractable, without status migrainosus: Secondary | ICD-10-CM | POA: Insufficient documentation

## 2017-04-29 ENCOUNTER — Encounter (HOSPITAL_COMMUNITY): Payer: Self-pay | Admitting: Emergency Medicine

## 2017-04-29 ENCOUNTER — Emergency Department (HOSPITAL_COMMUNITY)
Admission: EM | Admit: 2017-04-29 | Discharge: 2017-04-29 | Disposition: A | Discharge status: Home / Self Care | Payer: Self-pay | Attending: Emergency Medicine | Admitting: Emergency Medicine

## 2017-04-29 ENCOUNTER — Emergency Department (HOSPITAL_COMMUNITY): Payer: Self-pay

## 2017-04-29 DIAGNOSIS — G43109 Migraine with aura, not intractable, without status migrainosus: Secondary | ICD-10-CM

## 2017-04-29 LAB — CBC
HEMATOCRIT: 37.7 % (ref 36.0–46.0)
Hemoglobin: 12.5 g/dL (ref 12.0–15.0)
MCH: 27.8 pg (ref 26.0–34.0)
MCHC: 33.2 g/dL (ref 30.0–36.0)
MCV: 84 fL (ref 78.0–100.0)
Platelets: 220 10*3/uL (ref 150–400)
RBC: 4.49 MIL/uL (ref 3.87–5.11)
RDW: 13.9 % (ref 11.5–15.5)
WBC: 9.3 10*3/uL (ref 4.0–10.5)

## 2017-04-29 LAB — URINALYSIS, ROUTINE W REFLEX MICROSCOPIC
BILIRUBIN URINE: NEGATIVE
GLUCOSE, UA: NEGATIVE mg/dL
HGB URINE DIPSTICK: NEGATIVE
Ketones, ur: NEGATIVE mg/dL
NITRITE: NEGATIVE
Protein, ur: NEGATIVE mg/dL
SPECIFIC GRAVITY, URINE: 1.017 (ref 1.005–1.030)
pH: 6 (ref 5.0–8.0)

## 2017-04-29 LAB — COMPREHENSIVE METABOLIC PANEL
ALT: 22 U/L (ref 14–54)
AST: 21 U/L (ref 15–41)
Albumin: 4.2 g/dL (ref 3.5–5.0)
Alkaline Phosphatase: 65 U/L (ref 38–126)
Anion gap: 11 (ref 5–15)
BUN: 13 mg/dL (ref 6–20)
CHLORIDE: 104 mmol/L (ref 101–111)
CO2: 23 mmol/L (ref 22–32)
Calcium: 9.5 mg/dL (ref 8.9–10.3)
Creatinine, Ser: 0.71 mg/dL (ref 0.44–1.00)
Glucose, Bld: 108 mg/dL — ABNORMAL HIGH (ref 65–99)
POTASSIUM: 3.6 mmol/L (ref 3.5–5.1)
Sodium: 138 mmol/L (ref 135–145)
Total Bilirubin: 0.3 mg/dL (ref 0.3–1.2)
Total Protein: 8 g/dL (ref 6.5–8.1)

## 2017-04-29 LAB — I-STAT CHEM 8, ED
BUN: 14 mg/dL (ref 6–20)
Calcium, Ion: 1.16 mmol/L (ref 1.15–1.40)
Chloride: 104 mmol/L (ref 101–111)
Creatinine, Ser: 0.6 mg/dL (ref 0.44–1.00)
Glucose, Bld: 106 mg/dL — ABNORMAL HIGH (ref 65–99)
HEMATOCRIT: 39 % (ref 36.0–46.0)
HEMOGLOBIN: 13.3 g/dL (ref 12.0–15.0)
Potassium: 3.6 mmol/L (ref 3.5–5.1)
Sodium: 139 mmol/L (ref 135–145)
TCO2: 24 mmol/L (ref 22–32)

## 2017-04-29 LAB — PROTIME-INR
INR: 0.93
Prothrombin Time: 12.4 seconds (ref 11.4–15.2)

## 2017-04-29 LAB — I-STAT BETA HCG BLOOD, ED (MC, WL, AP ONLY)

## 2017-04-29 LAB — DIFFERENTIAL
BASOS ABS: 0 10*3/uL (ref 0.0–0.1)
BASOS PCT: 0 %
EOS ABS: 0.6 10*3/uL (ref 0.0–0.7)
Eosinophils Relative: 6 %
Lymphocytes Relative: 48 %
Lymphs Abs: 4.5 10*3/uL — ABNORMAL HIGH (ref 0.7–4.0)
MONO ABS: 0.5 10*3/uL (ref 0.1–1.0)
MONOS PCT: 6 %
Neutro Abs: 3.7 10*3/uL (ref 1.7–7.7)
Neutrophils Relative %: 40 %

## 2017-04-29 LAB — APTT: aPTT: 29 seconds (ref 24–36)

## 2017-04-29 LAB — I-STAT TROPONIN, ED: TROPONIN I, POC: 0 ng/mL (ref 0.00–0.08)

## 2017-04-29 LAB — ETHANOL

## 2017-04-29 LAB — RAPID URINE DRUG SCREEN, HOSP PERFORMED
Amphetamines: NOT DETECTED
Barbiturates: NOT DETECTED
Benzodiazepines: NOT DETECTED
Cocaine: NOT DETECTED
OPIATES: NOT DETECTED
TETRAHYDROCANNABINOL: NOT DETECTED

## 2017-04-29 NOTE — ED Triage Notes (Signed)
Pt presents with multiple complaints. Pt reports feeling "funny" X3 weeks, states she was seen at the [facility] on Tuesday for the same. Pt reports gen weakness to her arms and legs but reports she has had L sided facial droop and dec sensation on L side. Pt is hypertensive and was started on BP meds Tuesday.

## 2017-04-30 NOTE — ED Provider Notes (Signed)
[place] EMERGENCY DEPARTMENT Provider Note   CSN: [id number] Arrival date & time: 04/28/17  2350     History   Chief Complaint Chief Complaint  Patient presents with  . Weakness    HPI Alicia Obrien is a 42 y.o. female.  HPI Patient is a 42 year old female presents the emergency department with complaints of a feeling of foggy headedness as well as intermittent paresthesias and numbness of her arms and legs.  She was concerned earlier today because she had developed headache and felt as though she had a slight droop on the left side of her face.  She also feels like her speech was slightly abnormal at that time.  She has a history of elevated blood pressure.  She is on blood pressure medications.  Currently she is asymptomatic and reports no paresthesias or weakness of her arms or legs.  She feels like her speech is returned to baseline.  She states over the past several weeks she has had episodes like this before but they are always associated with headache.  She is never seen a neurologist.  She is never had a diagnosis formally of migraine headache.  She reports with resolution of her headache here in the emergency department her symptoms resolved as well.  Stroke workup was initiated by the triage team prior to my evaluation   Past Medical History:  Diagnosis Date  . Elevated cholesterol   . Heart murmur   . Hypertension   . Vertigo     Patient Active Problem List   Diagnosis Date Noted  . Vertigo 03/27/2015  . Obesity 03/27/2015  . Essential hypertension 01/28/2015  . History of ovarian cyst 01/28/2015  . Hyperlipidemia 01/28/2015    Past Surgical History:  Procedure Laterality Date  . NO PAST SURGERIES       OB History    Gravida  5   Para  3   Term  3   Preterm      AB  1   Living  3     SAB  1   TAB      Ectopic      Multiple      Live Births               Home Medications    Prior to Admission medications     Medication Sig Start Date End Date Taking? Authorizing Provider  amLODipine (NORVASC) 5 MG tablet Take 1 tablet (5 mg total) by mouth daily. 04/25/17  Yes Robyn Haber, MD  acetaminophen (TYLENOL) 500 MG tablet Take 1 tablet (500 mg total) by mouth every 6 (six) hours as needed. Patient not taking: Reported on 04/29/2017 08/09/16   Frederica Kuster, PA-C    Family History Family History  Problem Relation Age of Onset  . Diabetes Mother   . Cancer Maternal Aunt        breast  . Cancer Maternal Uncle        bone cancer   . Heart disease Maternal Uncle     Social History Social History   Tobacco Use  . Smoking status: Never Smoker  . Smokeless tobacco: Never Used  Substance Use Topics  . Alcohol use: No  . Drug use: No     Allergies   Latex   Review of Systems Review of Systems  All other systems reviewed and are negative.    Physical Exam Updated Vital Signs BP 134/86   Pulse 78   Temp 98  F (36.7 C) (Oral)   Resp 18   Ht 5\' 8"  (1.727 m)   Wt 97.5 kg (215 lb)   SpO2 98%   BMI 32.69 kg/m   Physical Exam  Constitutional: She is oriented to person, place, and time. She appears well-developed and well-nourished. No distress.  HENT:  Head: Normocephalic and atraumatic.  Eyes: Pupils are equal, round, and reactive to light. EOM are normal.  Neck: Normal range of motion.  Cardiovascular: Normal rate, regular rhythm and normal heart sounds.  Pulmonary/Chest: Effort normal and breath sounds normal.  Abdominal: Soft. She exhibits no distension. There is no tenderness.  Musculoskeletal: Normal range of motion.  Neurological: She is alert and oriented to person, place, and time.  5/5 strength in major muscle groups of  bilateral upper and lower extremities. Speech normal. No facial asymetry.   Skin: Skin is warm and dry.  Psychiatric: She has a normal mood and affect. Judgment normal.  Nursing note and vitals reviewed.    ED Treatments / Results  Labs (all  labs ordered are listed, but only abnormal results are displayed) Labs Reviewed  DIFFERENTIAL - Abnormal; Notable for the following components:      Result Value   Lymphs Abs 4.5 (*)    All other components within normal limits  COMPREHENSIVE METABOLIC PANEL - Abnormal; Notable for the following components:   Glucose, Bld 108 (*)    All other components within normal limits  URINALYSIS, ROUTINE W REFLEX MICROSCOPIC - Abnormal; Notable for the following components:   APPearance HAZY (*)    Leukocytes, UA MODERATE (*)    Bacteria, UA RARE (*)    Squamous Epithelial / LPF 6-30 (*)    All other components within normal limits  I-STAT CHEM 8, ED - Abnormal; Notable for the following components:   Glucose, Bld 106 (*)    All other components within normal limits  PROTIME-INR  APTT  CBC  ETHANOL  RAPID URINE DRUG SCREEN, HOSP PERFORMED  I-STAT TROPONIN, ED  I-STAT BETA HCG BLOOD, ED ([facility], [facility], [facility] ONLY)    EKG EKG Interpretation  Date/Time:  Friday April 28 2017 23:57:53 EDT Ventricular Rate:  86 PR Interval:  156 QRS Duration: 80 QT Interval:  388 QTC Calculation: 464 R Axis:   72 Text Interpretation:  Normal sinus rhythm with sinus arrhythmia T wave abnormality, consider inferior ischemia Prolonged QT Abnormal ECG No old tracing to compare Confirmed by Deno Etienne [phone]) on 04/29/2017 1:48:27 PM   Radiology Ct Head Wo Contrast  Result Date: 04/29/2017 CLINICAL DATA:  Acute onset of generalized arm and leg weakness. Left-sided facial droop and decreased sensation on the left side. High blood pressure. EXAM: CT HEAD WITHOUT CONTRAST TECHNIQUE: Contiguous axial images were obtained from the base of the skull through the vertex without intravenous contrast. COMPARISON:  None. FINDINGS: Brain: No evidence of acute infarction, hemorrhage, hydrocephalus, extra-axial collection or mass lesion/mass effect. The posterior fossa, including the cerebellum, brainstem and fourth ventricle, is  within normal limits. The third and lateral ventricles, and basal ganglia are unremarkable in appearance. The cerebral hemispheres are symmetric in appearance, with normal gray-white differentiation. No mass effect or midline shift is seen. Vascular: No hyperdense vessel or unexpected calcification. Skull: There is no evidence of fracture; visualized osseous structures are unremarkable in appearance. Sinuses/Orbits: The visualized portions of the orbits are within normal limits. The paranasal sinuses and mastoid air cells are well-aerated. Other: No significant soft tissue abnormalities are seen. IMPRESSION: Unremarkable noncontrast  CT of the head. Electronically Signed   By: Garald Balding M.D.   On: 04/29/2017 00:47    Procedures Procedures (including critical care time)  Medications Ordered in ED Medications - No data to display   Initial Impression / Assessment and Plan / ED Course  I have reviewed the triage vital signs and the nursing notes.  Pertinent labs & imaging results that were available during my care of the patient were reviewed by me and considered in my medical decision making (see chart for details).    Normal neurologic exam at this time.  Patient feels much better at this time.  This may represent comp gated migraine.  Outpatient neurology follow-up.    Head CT personally reviewed demonstrates no significant abnormalities.    Labs reviewed.    Recent urgent care visit on April 25, 2017 reviewed as well  Final Clinical Impressions(s) / ED Diagnoses   Final diagnoses:  Complicated migraine    ED Discharge Orders    None       Jola Schmidt, MD 04/30/17 [phone]

## 2017-08-02 ENCOUNTER — Emergency Department (HOSPITAL_COMMUNITY)
Admission: EM | Admit: 2017-08-02 | Discharge: 2017-08-02 | Disposition: A | Discharge status: Home / Self Care | Payer: Medicaid Other | Attending: Emergency Medicine | Admitting: Emergency Medicine

## 2017-08-02 ENCOUNTER — Encounter (HOSPITAL_COMMUNITY): Payer: Self-pay | Admitting: *Deleted

## 2017-08-02 ENCOUNTER — Other Ambulatory Visit: Payer: Self-pay

## 2017-08-02 ENCOUNTER — Emergency Department (HOSPITAL_COMMUNITY): Payer: Medicaid Other

## 2017-08-02 DIAGNOSIS — I10 Essential (primary) hypertension: Secondary | ICD-10-CM | POA: Insufficient documentation

## 2017-08-02 DIAGNOSIS — Y999 Unspecified external cause status: Secondary | ICD-10-CM | POA: Insufficient documentation

## 2017-08-02 DIAGNOSIS — Z79899 Other long term (current) drug therapy: Secondary | ICD-10-CM | POA: Insufficient documentation

## 2017-08-02 DIAGNOSIS — X58XXXA Exposure to other specified factors, initial encounter: Secondary | ICD-10-CM | POA: Insufficient documentation

## 2017-08-02 DIAGNOSIS — Y939 Activity, unspecified: Secondary | ICD-10-CM | POA: Insufficient documentation

## 2017-08-02 DIAGNOSIS — S91331A Puncture wound without foreign body, right foot, initial encounter: Secondary | ICD-10-CM | POA: Insufficient documentation

## 2017-08-02 DIAGNOSIS — M79671 Pain in right foot: Secondary | ICD-10-CM

## 2017-08-02 DIAGNOSIS — Y929 Unspecified place or not applicable: Secondary | ICD-10-CM | POA: Insufficient documentation

## 2017-08-02 NOTE — ED Triage Notes (Signed)
Pt reports stepping on unknown object over a year ago, still has small open wound on bottom of foot that will not heal, causing pain when ambulating. No redness or swelling is noted.

## 2017-08-02 NOTE — Discharge Instructions (Addendum)
Keep wound clean and dry, use shoe insert or corn pad as discussed to limit pressure on the affected area of your foot. Follow up with podiatry- you may need a referral from your primary care provider.

## 2017-08-02 NOTE — ED Notes (Signed)
Declined W/C at D/C and was escorted to lobby by RN. 

## 2017-08-02 NOTE — ED Provider Notes (Signed)
[facility] EMERGENCY DEPARTMENT Provider Note   CSN: [id number] Arrival date & time: 08/02/17  1042     History   Chief Complaint Chief Complaint  Patient presents with  . Foot Pain    HPI Alicia Obrien is a 42 y.o. female.  42 year old female presents with complaint of right foot wound.  Patient states in December 2018 she stepped out of the shower and stepped on something, unsure what and used a tweezers to pull a small foreign body out of her foot.  Patient states the area did not heal well and continued to bother her so in February she dug into the wound a little bit to try to remove any other foreign body, no further foreign bodies found.  Patient states on occasion she has pain in the area and digs at it with a tweezers to try to remove something in the wound.  No redness, no drainage.  Patient is not a diabetic.  No other complaints or concerns.     Past Medical History:  Diagnosis Date  . Elevated cholesterol   . Heart murmur   . Hypertension   . Vertigo     Patient Active Problem List   Diagnosis Date Noted  . Vertigo 03/27/2015  . Obesity 03/27/2015  . Essential hypertension 01/28/2015  . History of ovarian cyst 01/28/2015  . Hyperlipidemia 01/28/2015    Past Surgical History:  Procedure Laterality Date  . NO PAST SURGERIES       OB History    Gravida  5   Para  3   Term  3   Preterm      AB  1   Living  3     SAB  1   TAB      Ectopic      Multiple      Live Births               Home Medications    Prior to Admission medications   Medication Sig Start Date End Date Taking? Authorizing Provider  acetaminophen (TYLENOL) 500 MG tablet Take 1 tablet (500 mg total) by mouth every 6 (six) hours as needed. Patient not taking: Reported on 04/29/2017 08/09/16   Frederica Kuster, PA-C  amLODipine (NORVASC) 5 MG tablet Take 1 tablet (5 mg total) by mouth daily. 04/25/17   Robyn Haber, MD    Family  History Family History  Problem Relation Age of Onset  . Diabetes Mother   . Cancer Maternal Aunt        breast  . Cancer Maternal Uncle        bone cancer   . Heart disease Maternal Uncle     Social History Social History   Tobacco Use  . Smoking status: Never Smoker  . Smokeless tobacco: Never Used  Substance Use Topics  . Alcohol use: No  . Drug use: No     Allergies   Latex   Review of Systems Review of Systems  Constitutional: Negative for fever.  Musculoskeletal: Positive for myalgias. Negative for arthralgias and joint swelling.  Skin: Positive for wound.  Allergic/Immunologic: Negative for immunocompromised state.  Neurological: Negative for weakness and numbness.  Hematological: Negative for adenopathy. Does not bruise/bleed easily.  All other systems reviewed and are negative.    Physical Exam Updated Vital Signs BP 120/79 (BP Location: Right Arm)   Pulse 69   Temp 98.5 F (36.9 C) (Oral)   Resp 16   LMP 07/30/2017  SpO2 98%   Physical Exam  Constitutional: She appears well-developed and well-nourished.  Cardiovascular: Intact distal pulses.  Musculoskeletal: Normal range of motion. She exhibits tenderness. She exhibits no deformity.       Feet:  Neurological: She is alert.  Skin: Skin is warm and dry. No rash noted.  Psychiatric: She has a normal mood and affect. Her behavior is normal.  Nursing note and vitals reviewed.    ED Treatments / Results  Labs (all labs ordered are listed, but only abnormal results are displayed) Labs Reviewed - No data to display  EKG None  Radiology Dg Foot Complete Right  Result Date: 08/02/2017 CLINICAL DATA:  Nonhealing open wound in plantar aspect of the right foot since stepping on an unknown object over 1 year ago. EXAM: RIGHT FOOT COMPLETE - 3+ VIEW COMPARISON:  01/15/2016. FINDINGS: There is a small defect in the skin in the plantar soft tissues of the foot at the level of the mid foot, with mild  skin thickening in that region. The underlying subcutaneous fat and bones have normal appearances. No radiopaque foreign body is seen. Mild posterior calcaneal spur formation is noted. IMPRESSION: Small open wound in the skin of the plantar aspect of the mid right foot with no visible radiopaque foreign body or underlying bony abnormality. Electronically Signed   By: Claudie Revering M.D.   On: 08/02/2017 11:43    Procedures Procedures (including critical care time)  Medications Ordered in ED Medications - No data to display   Initial Impression / Assessment and Plan / ED Course  I have reviewed the triage vital signs and the nursing notes.  Pertinent labs & imaging results that were available during my care of the patient were reviewed by me and considered in my medical decision making (see chart for details).  Clinical Course as of Aug 03 1219  Wed Aug 03, 7226  8029 42 year old nondiabetic female presents with right foot wound x7 months.  Patient continues to pick at the wound on occasion due to the pain and she is concerned she has a retained foreign body.  X-rays negative for foreign body or osteomyelitis.  On exam patient has a callus to the lateral aspect of the sole of her right foot with an open area in the center which does not extend into the dermis.  No evidence of infection at this time.  Advised patient to follow-up with podiatry, in the meantime she can try taking a corn pad or other foot pad underscore out the center to prevent this area of her foot from applying pressure while walking.   [LM]    Clinical Course User Index [LM] Tacy Learn, PA-C     Final Clinical Impressions(s) / ED Diagnoses   Final diagnoses:  Foot pain, right  Penetrating wound of right foot, initial encounter    ED Discharge Orders    None       Roque Lias 08/02/17 1221    Mesner, Corene Cornea, MD 08/05/17 [phone]

## 2019-06-07 ENCOUNTER — Other Ambulatory Visit: Payer: Self-pay | Admitting: Endocrinology

## 2019-06-07 DIAGNOSIS — R921 Mammographic calcification found on diagnostic imaging of breast: Secondary | ICD-10-CM

## 2019-07-19 ENCOUNTER — Other Ambulatory Visit: Payer: Self-pay

## 2019-07-19 ENCOUNTER — Ambulatory Visit
Admission: RE | Admit: 2019-07-19 | Discharge: 2019-07-19 | Disposition: A | Discharge status: Home / Self Care | Payer: BC Managed Care – PPO | Source: Ambulatory Visit | Attending: Endocrinology | Admitting: Endocrinology

## 2019-07-19 DIAGNOSIS — R921 Mammographic calcification found on diagnostic imaging of breast: Secondary | ICD-10-CM

## 2019-07-22 ENCOUNTER — Telehealth: Payer: Self-pay

## 2019-07-22 ENCOUNTER — Other Ambulatory Visit: Payer: Self-pay

## 2019-07-22 ENCOUNTER — Other Ambulatory Visit (HOSPITAL_COMMUNITY)
Admission: RE | Admit: 2019-07-22 | Discharge: 2019-07-22 | Disposition: A | Discharge status: Home / Self Care | Payer: BC Managed Care – PPO | Source: Ambulatory Visit | Attending: Obstetrics and Gynecology | Admitting: Obstetrics and Gynecology

## 2019-07-22 ENCOUNTER — Other Ambulatory Visit: Payer: Self-pay | Admitting: Obstetrics and Gynecology

## 2019-07-22 ENCOUNTER — Encounter: Payer: Self-pay | Admitting: Obstetrics and Gynecology

## 2019-07-22 ENCOUNTER — Ambulatory Visit (INDEPENDENT_AMBULATORY_CARE_PROVIDER_SITE_OTHER): Payer: BC Managed Care – PPO | Admitting: Obstetrics and Gynecology

## 2019-07-22 VITALS — BP 108/64 | HR 77 | Ht 69.0 in | Wt 218.0 lb

## 2019-07-22 DIAGNOSIS — N914 Secondary oligomenorrhea: Secondary | ICD-10-CM | POA: Diagnosis not present

## 2019-07-22 DIAGNOSIS — N939 Abnormal uterine and vaginal bleeding, unspecified: Secondary | ICD-10-CM

## 2019-07-22 DIAGNOSIS — R102 Pelvic and perineal pain: Secondary | ICD-10-CM | POA: Diagnosis not present

## 2019-07-22 DIAGNOSIS — N941 Unspecified dyspareunia: Secondary | ICD-10-CM | POA: Diagnosis not present

## 2019-07-22 DIAGNOSIS — N730 Acute parametritis and pelvic cellulitis: Secondary | ICD-10-CM

## 2019-07-22 DIAGNOSIS — R232 Flushing: Secondary | ICD-10-CM

## 2019-07-22 MED ORDER — DOXYCYCLINE HYCLATE 100 MG PO CAPS: 100.0000 mg | ORAL_CAPSULE | Freq: Two times a day (BID) | ORAL | 0 refills | Status: DC

## 2019-07-22 MED ORDER — METRONIDAZOLE 500 MG PO TABS
500.0000 mg | ORAL_TABLET | Freq: Two times a day (BID) | ORAL | 0 refills | Status: DC
Start: 2019-07-22 — End: 2019-09-03

## 2019-07-22 MED ORDER — CEFTRIAXONE SODIUM 500 MG IJ SOLR
500.0000 mg | Freq: Once | INTRAMUSCULAR | Status: AC
  Administered 2019-07-22: 500 mg via INTRAMUSCULAR

## 2019-07-22 NOTE — Progress Notes (Signed)
44 y.o. [id number] Single Black or African American Not Hispanic or Latino female here for a consultation fro Lucky Cowboy, FNP for abnormal uterine bleeding and an enlarged uterus with a thickend endometrium Ultrasound from June/25/2021: Uterus 10.1 x 7.5 x 5.7 cm, endometrium 1.5 cm. Right ovary is 4.7 x 4.0 x 3.1 cm, left ovary not seen.  Last pap was 06/02/19: normal .  Cycles have been irregular since she was 37. Prior to that her cycles were regular, but heavy leading to anemia.  In the last several years her cycles have been very irregular. She can go up to 3-6 months between cycles. In the last 6 months her cycles have ranged from every 4-8 weeks, bleeds for 2 weeks. In the past she has bleed for up to 3 weeks. Very heavy for 3 days, can saturate a super + tampon in up to 40 minutes. No intermenstrual bleeding. Lower back with her cycles. She is having chronic pelvic pain. The cramps are bad, feels like labor pains, occurs all month long for years. Takes midol to take the edge off, takes it every day.  No bowel or bladder c/o. Sexually active, same partner x 7 months. Vasectomy for contraception. Not using condoms. Sex is painful, deep inside, infrequent intercourse.  She had negative STD testing in 12/20.  She reports galactorrhea several years ago, not now. Her weight goes up and down. She has bothersome hot flashes, daily. No regular night sweats.  She hasn't done well with the mirena IUD, had mood changes. It didn't help her bleeding.   Period Duration (Days): 2 Period Pattern: (!) Irregular Menstrual Flow: Heavy Menstrual Control: Maxi pad, Other (Comment) Menstrual Control Change Freq (Hours): 1 Dysmenorrhea: (!) Severe (Back pain) Dysmenorrhea Symptoms: Cramping  Patient's last menstrual period was 07/08/2019.          Sexually active: Yes.    The current method of family planning is none.    Exercising: No.  The patient has a physically strenuous job, but has no regular  exercise apart from work.  Smoker:  no  Health Maintenance: Pap:  06/02/19 normal  History of abnormal Pap:  yes MMG:  07/19/19 Bi-rads 1 neg  BMD:   None  Colonoscopy: none  TDaP:  06/07/16  Gardasil: none    reports that she has never smoked. She has never used smokeless tobacco. She reports that she does not drink alcohol and does not use drugs. 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   Past Medical History:  Diagnosis Date  . Elevated cholesterol   . Heart murmur   . Hypertension   . Vertigo     Past Surgical History:  Procedure Laterality Date  . NO PAST SURGERIES      Current Outpatient Medications  Medication Sig Dispense Refill  . acetaminophen (TYLENOL) 500 MG tablet Take 1 tablet (500 mg total) by mouth every 6 (six) hours as needed. 30 tablet 0  . amLODipine (NORVASC) 5 MG tablet Take 1 tablet (5 mg total) by mouth daily. 90 tablet 3  . atorvastatin (LIPITOR) 20 MG tablet Take 20 mg by mouth daily.    . cetirizine (ZYRTEC) 10 MG tablet Take 20 mg by mouth daily.    . hydrochlorothiazide (HYDRODIURIL) 12.5 MG tablet Take 1 tablet by mouth daily.    . Liraglutide -Weight Management (SAXENDA) 18 MG/3ML SOPN 3 (THREE) MILLIGRAM UNDER SKIN DAILY, MAX DAILY DOSE  3 MILLIGRAM     No  current facility-administered medications for this visit.    Family History  Problem Relation Age of Onset  . Diabetes Mother   . Cancer Maternal Aunt        breast  . Cancer Maternal Uncle        bone cancer   . Heart disease Maternal Uncle   . Breast cancer Maternal Grandmother   . Breast cancer Paternal Grandmother   . Breast cancer Cousin 14  MAunt, maternal cousin and maternal GM all with breast cancer (doesn't know ages)  Review of Systems  Genitourinary: Positive for menstrual problem, pelvic pain and vaginal bleeding.  All other systems reviewed and are negative.   Exam:   BP 108/64   Pulse 77   Ht 5\' 9"  (1.753 m)   Wt 218 lb (98.9 kg)   LMP 07/08/2019    SpO2 95%   BMI 32.19 kg/m   Weight change: @WEIGHTCHANGE @ Height:   Height: 5\' 9"  (175.3 cm)  Ht Readings from Last 3 Encounters:  07/22/19 5\' 9"  (1.753 m)  04/29/17 5\' 8"  (1.727 m)  08/27/16 5\' 9"  (1.753 m)    General appearance: alert, cooperative and appears stated age Head: Normocephalic, without obvious abnormality, atraumatic Neck: no adenopathy, supple, symmetrical, trachea midline and thyroid normal to inspection and palpation Lungs: clear to auscultation bilaterally Cardiovascular: regular rate and rhythm Breasts: normal appearance, no masses or tenderness Abdomen: soft, non-tender; non distended,  no masses,  no organomegaly Extremities: extremities normal, atraumatic, no cyanosis or edema Skin: Skin color, texture, turgor normal. No rashes or lesions Lymph nodes: Cervical, supraclavicular, and axillary nodes normal. No abnormal inguinal nodes palpated Neurologic: Grossly normal   Pelvic: External genitalia:  no lesions              Urethra:  normal appearing urethra with no masses, tenderness or lesions              Bartholins and Skenes: normal                 Vagina: normal appearing vagina with normal color and discharge, no lesions              Cervix: cervical motion tenderness and no lesions               Bimanual Exam:  Uterus:  anteverted, mobile, extremly tender, mobile, top normal sized              Adnexa: no masses, bilaterally tender               Rectovaginal: Confirms               Anus:  normal sphincter tone, no lesions  Bladder: mildly tender  Pelvic floor: not tender on the right, mildly tender on the left   The risks of endometrial biopsy were reviewed and a consent was obtained.  A speculum was placed in the vagina and the cervix was cleansed with Hibiclens. A tenaculum was placed on the cervix and the pipelle was placed into the endometrial cavity. The uterus sounded to ~9 cm. The endometrial biopsy was performed, taking care to get a  representative sample, sampling 360 degrees of the uterine cavity. Moderate tissue was obtained. The tenaculum and speculum were removed. There were no complications.    Gae Dry chaperoned for the exam.  A:  Abnormal uterine bleeding  Oligomenorrhea  Pelvic pain, exam is concerning for PID  Dyspareunia  Vasomotor symptoms  P:   CBC with  diff, Prolactin, TSH, FSH  Cervical cultures, declines blood work for [employer]  UPT negative  Endometrial biopsy done  Treat with Ceftriaxone 500 mg IM x 1 and po doxy and flagyl x 2 weeks  F/U later this week for a repeat exam  Not a candidate for OCP's  Hasn't done well with the mirena IUD, caused mood changes and didn't help her bleeding  Will further discuss treatment options at her f/u visit.    CC: Lucky Cowboy, FNP Note sent

## 2019-07-22 NOTE — Telephone Encounter (Signed)
Orders placed for patient to have CBC with diff, Prolactin, TSH, FSH done at outside [facility].

## 2019-07-22 NOTE — Patient Instructions (Signed)
Pelvic Inflammatory Disease  Pelvic inflammatory disease (PID) is caused by an infection in some or all of the female reproductive organs. The infection can be in the uterus, ovaries, fallopian tubes, or the surrounding tissues in the pelvis. PID can cause abdominal or pelvic pain that comes on suddenly (acute pelvic pain). PID is a serious infection because it can lead to lasting (chronic) pelvic pain or the inability to have children (infertility). What are the causes? This condition is most often caused by bacteria that is spread during sexual contact. It can also be caused by a bacterial infection of the vagina (bacterial vaginosis) that is not spread by sexual contact. This condition occurs when the infection is not treated and the bacteria travel upward from the vagina or cervix into the reproductive organs. Bacteria may also be introduced into the reproductive organs following:  The birth of a baby.  A miscarriage.  An abortion.  Major pelvic surgery.  The insertion of an intrauterine device (IUD).  A sexual assault. What increases the risk? You are more likely to develop this condition if you:  Are younger than 44 years of age.  Are sexually active at a young age.  Have a history of STI (sexually transmitted infection) or PID.  Do not regularly use barrier contraception methods, such as condoms.  Have multiple sexual partners.  Have sex with someone who has symptoms of an STI.  Use a vaginal douche.  Have recently had an IUD inserted. What are the signs or symptoms? Symptoms of this condition include:  Abdominal or pelvic pain.  Fever.  Chills.  Abnormal vaginal discharge.  Abnormal uterine bleeding.  Unusual pain shortly after the end of a menstrual period.  Painful urination.  Pain with sex.  Nausea and vomiting. How is this diagnosed? This condition is diagnosed based on a pelvic exam and medical history. A pelvic exam can reveal signs of  infection, inflammation, and discharge in the vagina and the surrounding tissues. It can also help to identify painful areas. You may also have tests, such as:  Lab tests, including a pregnancy test, blood tests, and a urine test.  Culture tests of the vagina and cervix to check for an STI.  Ultrasound.  A laparoscopic procedure to look inside the pelvis.  Examination of vaginal discharge under a microscope. How is this treated? This condition may be treated with:  Antibiotic medicines taken by mouth (orally). For more severe cases, antibiotics may be given through an IV at the hospital.  Surgery. This is rare. Surgery may be needed if other treatments do not help.  Efforts to stop the spread of the infection. Sexual partners may need to be treated if the infection is caused by an STI. It may take weeks until you are completely well. If you are diagnosed with PID, you should also be checked for HIV (human immunodeficiency virus). Your health care provider may test you for infection again 3 months after treatment. You should not have unprotected sex. Follow these instructions at home:  Take over-the-counter and prescription medicines only as told by your health care provider.  If you were prescribed an antibiotic medicine, take it as told by your health care provider. Do not stop using the antibiotic even if you start to feel better.  Do not have sex until treatment is completed or as told by your health care provider. If PID is confirmed, your recent sexual partners will need treatment, especially if you had unprotected sex.  Keep all  follow-up visits as told by your health care provider. This is important. Contact a health care provider if:  You have increased or abnormal vaginal discharge.  Your pain does not improve.  You vomit.  You have a fever.  You cannot tolerate your medicines.  Your partner has an STI.  You have pain when you urinate. Get help right away  if:  You have increased abdominal or pelvic pain.  You have chills.  Your symptoms are not better in 72 hours with treatment. Summary  Pelvic inflammatory disease (PID) is caused by an infection in some or all of the female reproductive organs.  PID is a serious infection because it can lead to lasting (chronic) pelvic pain or the inability to have children (infertility).  This infection is usually treated with antibiotic medicines.  Do not have sex until treatment is completed or as told by your health care provider. This information is not intended to replace advice given to you by your health care provider. Make sure you discuss any questions you have with your health care provider. Document Revised: 09/14/2017 Document Reviewed: 09/19/2017 Elsevier Patient Education  [place]. Abnormal Uterine Bleeding Abnormal uterine bleeding means bleeding more than usual from your uterus. It can include:  Bleeding between periods.  Bleeding after sex.  Bleeding that is heavier than normal.  Periods that last longer than usual.  Bleeding after you have stopped having your period (menopause). There are many problems that may cause this. You should see a doctor for any kind of bleeding that is not normal. Treatment depends on the cause of the bleeding. Follow these instructions at home:  Watch your condition for any changes.  Do not use tampons, douche, or have sex, if your doctor tells you not to.  Change your pads often.  Get regular well-woman exams. Make sure they include a pelvic exam and cervical cancer screening.  Keep all follow-up visits as told by your doctor. This is important. Contact a doctor if:  The bleeding lasts more than one week.  You feel dizzy at times.  You feel like you are going to throw up (nauseous).  You throw up. Get help right away if:  You pass out.  You have to change pads every hour.  You have belly (abdominal) pain.  You have  a fever.  You get sweaty.  You get weak.  You passing large blood clots from your vagina. Summary  Abnormal uterine bleeding means bleeding more than usual from your uterus.  There are many problems that may cause this. You should see a doctor for any kind of bleeding that is not normal.  Treatment depends on the cause of the bleeding. This information is not intended to replace advice given to you by your health care provider. Make sure you discuss any questions you have with your health care provider. Document Revised: 12/22/2015 Document Reviewed: 12/22/2015 Elsevier Patient Education  2020 [employer].

## 2019-07-23 LAB — CBC WITH DIFFERENTIAL/PLATELET
Basophils Absolute: 0 10*3/uL (ref 0.0–0.2)
Basos: 0 %
EOS (ABSOLUTE): 0.3 10*3/uL (ref 0.0–0.4)
Eos: 4 %
Hematocrit: 38.4 % (ref 34.0–46.6)
Hemoglobin: 12.6 g/dL (ref 11.1–15.9)
Immature Grans (Abs): 0 10*3/uL (ref 0.0–0.1)
Immature Granulocytes: 0 %
Lymphocytes Absolute: 4.1 10*3/uL — ABNORMAL HIGH (ref 0.7–3.1)
Lymphs: 45 %
MCH: 27.6 pg (ref 26.6–33.0)
MCHC: 32.8 g/dL (ref 31.5–35.7)
MCV: 84 fL (ref 79–97)
Monocytes Absolute: 0.5 10*3/uL (ref 0.1–0.9)
Monocytes: 6 %
Neutrophils Absolute: 4.1 10*3/uL (ref 1.4–7.0)
Neutrophils: 45 %
Platelets: 210 10*3/uL (ref 150–450)
RBC: 4.56 x10E6/uL (ref 3.77–5.28)
RDW: 13.1 % (ref 11.7–15.4)
WBC: 9.1 10*3/uL (ref 3.4–10.8)

## 2019-07-23 LAB — PROLACTIN: Prolactin: 9.3 ng/mL (ref 4.8–23.3)

## 2019-07-23 LAB — TSH: TSH: 2.9 u[IU]/mL (ref 0.450–4.500)

## 2019-07-23 LAB — FOLLICLE STIMULATING HORMONE: FSH: 5 m[IU]/mL

## 2019-07-23 LAB — CHLAMYDIA/GONOCOCCUS/TRICHOMONAS, NAA
Chlamydia by NAA: NEGATIVE
Gonococcus by NAA: NEGATIVE
Trich vag by NAA: NEGATIVE

## 2019-07-25 ENCOUNTER — Other Ambulatory Visit: Payer: Self-pay

## 2019-07-25 ENCOUNTER — Encounter: Payer: Self-pay | Admitting: Obstetrics and Gynecology

## 2019-07-25 ENCOUNTER — Ambulatory Visit (INDEPENDENT_AMBULATORY_CARE_PROVIDER_SITE_OTHER): Payer: BC Managed Care – PPO | Admitting: Obstetrics and Gynecology

## 2019-07-25 VITALS — BP 110/64 | Ht 69.0 in | Wt 221.0 lb

## 2019-07-25 DIAGNOSIS — R102 Pelvic and perineal pain: Secondary | ICD-10-CM

## 2019-07-25 DIAGNOSIS — N939 Abnormal uterine and vaginal bleeding, unspecified: Secondary | ICD-10-CM | POA: Diagnosis not present

## 2019-07-25 DIAGNOSIS — Z803 Family history of malignant neoplasm of breast: Secondary | ICD-10-CM

## 2019-07-25 DIAGNOSIS — N941 Unspecified dyspareunia: Secondary | ICD-10-CM | POA: Diagnosis not present

## 2019-07-25 LAB — SURGICAL PATHOLOGY

## 2019-07-25 NOTE — Progress Notes (Signed)
GYNECOLOGY  VISIT   HPI: 44 y.o.   Single Unavailable Not Hispanic or Latino  female   [phone] with Patient's last menstrual period was 07/08/2019.   here for  Pelvic pain. She says that since her last visit that her pain is worse and that she is now bleeding and her back is hurting her.    She was started on antibiotics 3 days ago for concern of possible PID. WBC was 9, genprobe was negative, endometrial biopsy without plasma cells. The pain that she currently has is similar to her baseline daily pain, not worse than normal. She took midol today, 4 tablets helped some.   She has been daily pelvic pain since 2014.   GYNECOLOGIC HISTORY: Patient's last menstrual period was 07/08/2019. Contraception:none  Menopausal hormone therapy: none         OB History    Gravida  5   Para  3   Term  3   Preterm      AB  2   Living  3     SAB  2   TAB      Ectopic      Multiple      Live Births                 Patient Active Problem List   Diagnosis Date Noted  . Vertigo 03/27/2015  . Obesity 03/27/2015  . Essential hypertension 01/28/2015  . History of ovarian cyst 01/28/2015  . Hyperlipidemia 01/28/2015    Past Medical History:  Diagnosis Date  . Elevated cholesterol   . Heart murmur   . Hypertension   . Vertigo     Past Surgical History:  Procedure Laterality Date  . NO PAST SURGERIES      Current Outpatient Medications  Medication Sig Dispense Refill  . acetaminophen (TYLENOL) 500 MG tablet Take 1 tablet (500 mg total) by mouth every 6 (six) hours as needed. 30 tablet 0  . amLODipine (NORVASC) 5 MG tablet Take 1 tablet (5 mg total) by mouth daily. 90 tablet 3  . atorvastatin (LIPITOR) 20 MG tablet Take 20 mg by mouth daily.    . cetirizine (ZYRTEC) 10 MG tablet Take 20 mg by mouth daily.    Marland Kitchen doxycycline (VIBRAMYCIN) 100 MG capsule Take 1 capsule (100 mg total) by mouth 2 (two) times daily. Take BID for 14 days.  Take with food as can cause GI distress.  28 capsule 0  . hydrochlorothiazide (HYDRODIURIL) 12.5 MG tablet Take 1 tablet by mouth daily.    . Liraglutide -Weight Management (SAXENDA) 18 MG/3ML SOPN 3 (THREE) MILLIGRAM UNDER SKIN DAILY, MAX DAILY DOSE  3 MILLIGRAM    . metroNIDAZOLE (FLAGYL) 500 MG tablet Take 1 tablet (500 mg total) by mouth 2 (two) times daily. 28 tablet 0   No current facility-administered medications for this visit.     ALLERGIES: Latex  Family History  Problem Relation Age of Onset  . Diabetes Mother   . Cancer Maternal Aunt        breast  . Cancer Maternal Uncle        bone cancer   . Heart disease Maternal Uncle   . Breast cancer Maternal Grandmother   . Breast cancer Paternal Grandmother   . Breast cancer Cousin 85    Social History   Socioeconomic History  . Marital status: Single    Spouse name: Not on file  . Number of children: Not on file  . Years of  education: Not on file  . Highest education level: Not on file  Occupational History  . Not on file  Tobacco Use  . Smoking status: Never Smoker  . Smokeless tobacco: Never Used  Vaping Use  . Vaping Use: Never used  Substance and Sexual Activity  . Alcohol use: No  . Drug use: No  . Sexual activity: Yes  Other Topics Concern  . Not on file  Social History Narrative  . Not on file   Social Determinants of Health   Financial Resource Strain:   . Difficulty of Paying Living Expenses:   Food Insecurity:   . Worried About Charity fundraiser in the Last Year:   . Arboriculturist in the Last Year:   Transportation Needs:   . Film/video editor (Medical):   Marland Kitchen Lack of Transportation (Non-Medical):   Physical Activity:   . Days of Exercise per Week:   . Minutes of Exercise per Session:   Stress:   . Feeling of Stress :   Social Connections:   . Frequency of Communication with Friends and Family:   . Frequency of Social Gatherings with Friends and Family:   . Attends Religious Services:   . Active Member of Clubs or  Organizations:   . Attends Archivist Meetings:   Marland Kitchen Marital Status:   Intimate Partner Violence:   . Fear of Current or Ex-Partner:   . Emotionally Abused:   Marland Kitchen Physically Abused:   . Sexually Abused:     Review of Systems  Genitourinary:       Pelvic pain   Musculoskeletal: Positive for back pain.    PHYSICAL EXAMINATION:    BP 110/64   Ht 5\' 9"  (1.753 m)   Wt 221 lb (100.2 kg)   LMP 07/08/2019   BMI 32.64 kg/m     General appearance: alert, cooperative and appears stated age Abdomen: soft, tender BLQ, no rebound, no guarding; non distended, no masses,  no organomegaly  Pelvic: External genitalia:  no lesions              Urethra:  normal appearing urethra with no masses, tenderness or lesions              Bartholins and Skenes: normal                 Vagina: normal appearing vagina with normal color and discharge, no lesions              Cervix: cervical motion tenderness              Bimanual Exam:  Diffusely tender.  Pelvic floor: mildly tender bilaterally  Bladder: tender  Chaperone was present for exam.  ASSESSMENT Chronic pelvic pain, dyspareunia.  Exam the other day was concerning for PID. Normal WBC, negative cervical cultures, endometrial biopsy benign without plasma cells noted. She states that she is always extremely tender with pelvic exam.  AUB, negative biopsy.  FH of breast cancer in her [place], MGM, MA and maternal first cousin. Her cousin was 9 at diagnosis, she isn't sure about her other family members. Also PGM and PA with breast cancer.     PLAN She isn't a candidate for OCP's, hasn't tolerated a mirena IUD (with her tenderness I don't think it's a good option either).  We discussed hysterectomy, she would like definitive treatment. We discussed that pain is a complicated process and I can't guarantee her that she won't have pain after  surgery. Her uterus and cervix are very tender and this pain should be gone. She does have mild pelvic floor  tenderness and may need pelvic floor PT in the future.  Plan total laparoscopic hysterectomy with bilateral salpingectomies, possible treatment of endometriosis. Discussed risks of surgery and recovery time.  Referral placed to genetics, she should have this consultation prior to surgery. At this point I would not recommend oophorectomy. Will confirm with Pathology that the endometrial biopsy was without signs of infection. If this is the case she can stop the antibiotics.  A handout on hysterectomy was given.     Over 30 minutes in total patient care.   CC: Lucky Cowboy, FNP  Addendum: I spoke with the Pathologist, she will review the endometrial biopsy slides again and confirm there are no signs of infection.

## 2019-07-26 ENCOUNTER — Telehealth: Payer: Self-pay | Admitting: Genetic Counselor

## 2019-07-26 NOTE — Telephone Encounter (Signed)
Received a genetic counseling referral from Dr. Talbert Nan for fhx of breast cancer. Alicia Obrien returned my call and has been scheduled to see [name] on 7/19 at 9am. Pt aware to arrive 15 minutes early.

## 2019-07-29 ENCOUNTER — Inpatient Hospital Stay: Payer: BC Managed Care – PPO

## 2019-07-29 ENCOUNTER — Inpatient Hospital Stay: Payer: BC Managed Care – PPO | Attending: Genetic Counselor | Admitting: Genetic Counselor

## 2019-07-29 ENCOUNTER — Encounter: Payer: Self-pay | Admitting: Genetic Counselor

## 2019-07-29 ENCOUNTER — Other Ambulatory Visit: Payer: Self-pay

## 2019-07-29 ENCOUNTER — Other Ambulatory Visit: Payer: Self-pay | Admitting: Genetic Counselor

## 2019-07-29 DIAGNOSIS — Z803 Family history of malignant neoplasm of breast: Secondary | ICD-10-CM

## 2019-07-29 DIAGNOSIS — Z315 Encounter for genetic counseling: Secondary | ICD-10-CM | POA: Diagnosis not present

## 2019-07-29 DIAGNOSIS — Z8042 Family history of malignant neoplasm of prostate: Secondary | ICD-10-CM

## 2019-07-29 NOTE — Progress Notes (Signed)
REFERRING PROVIDER: [name], [address] [place],  [place] [address]  PRIMARY PROVIDER:  Patient, No Pcp Per  PRIMARY REASON FOR VISIT:  1. Family history of breast cancer   2. Family history of prostate cancer      HISTORY OF PRESENT ILLNESS:   Alicia Obrien, a 44 y.o. female, was seen for a [facility] cancer genetics consultation at the request of Dr. Talbert Nan due to a family history of breast and prostate cancer.  Alicia Obrien presents to clinic today to discuss the possibility of a hereditary predisposition to cancer, genetic testing, and to further clarify her future cancer risks, as well as potential cancer risks for family members.   Alicia Obrien is a 44 y.o. female with no personal history of cancer.  She states that she has had breast lumps in the past for which she needed follow up, but has not had cancer  CANCER HISTORY:  Oncology History   No history exists.     RISK FACTORS:  Menarche was at age 25.  First live birth at age 46.  OCP use for approximately a few years.  Ovaries intact: yes.  Hysterectomy: no.  Menopausal status: premenopausal.  HRT use: 0 years. Colonoscopy: no; not examined. Mammogram within the last year: yes. Number of breast biopsies: 0. Up to date with pelvic exams: yes. Any excessive radiation exposure in the past: no  Past Medical History:  Diagnosis Date  . Elevated cholesterol   . Family history of breast cancer   . Family history of prostate cancer   . Heart murmur   . Hypertension   . Vertigo     Past Surgical History:  Procedure Laterality Date  . NO PAST SURGERIES      Social History   Socioeconomic History  . Marital status: Single    Spouse name: Not on file  . Number of children: Not on file  . Years of education: Not on file  . Highest education level: Not on file  Occupational History  . Not on file  Tobacco Use  . Smoking status: Never Smoker  . Smokeless tobacco: Never Used  Vaping Use    . Vaping Use: Never used  Substance and Sexual Activity  . Alcohol use: No  . Drug use: No  . Sexual activity: Yes  Other Topics Concern  . Not on file  Social History Narrative  . Not on file   Social Determinants of Health   Financial Resource Strain:   . Difficulty of Paying Living Expenses:   Food Insecurity:   . Worried About Charity fundraiser in the Last Year:   . Arboriculturist in the Last Year:   Transportation Needs:   . Film/video editor (Medical):   Marland Kitchen Lack of Transportation (Non-Medical):   Physical Activity:   . Days of Exercise per Week:   . Minutes of Exercise per Session:   Stress:   . Feeling of Stress :   Social Connections:   . Frequency of Communication with Friends and Family:   . Frequency of Social Gatherings with Friends and Family:   . Attends Religious Services:   . Active Member of Clubs or Organizations:   . Attends Archivist Meetings:   Marland Kitchen Marital Status:      FAMILY HISTORY:  We obtained a detailed, 4-generation family history.  Significant diagnoses are listed below: Family History  Problem Relation Age of Onset  . Diabetes  Mother   . Cancer Maternal Uncle        bone cancer   . Heart disease Maternal Uncle   . Dementia Maternal Grandmother   . Breast cancer Paternal Grandmother 71       d. in her 58s  . Breast cancer Cousin 110       mother's maternal first cousin  . Breast cancer Paternal Aunt        dx early 35s  . Breast cancer Maternal Great-grandmother   . Heart attack Father   . Prostate cancer Paternal Uncle 40       dx 2nd time in his 38s  . Prostate cancer Maternal Uncle 58  . Breast cancer Other        PGMs sister dx in her 19s  . Other Paternal Aunt        has breast lumps, watching her closely    The patient has two daughters and one son who are cancer free.  She is an only child.  Her father is deceased and her mother is living.  Her mother has three brothers.  One brother had prostate cancer  at 45 and a second brother had bone cancer at 65.  The maternal grandparents are both deceased from non-cancer related concerns.  She reports that her mother had a maternal grandmother with breast cancer over 2 and a first cousin with breast cancer at 47.  The patient's father died in his 66's from a heart attack.  He had two brothers and three sisters.  One sister had breast cancer in her early 2's.  It is unknown if she had genetic testing.  A second sister has breast lumps that are being watched.  One brother died of prostate cancer.  The paternal grandparents are deceased. The grandmother had breast cancer at 90. The grandmother had a sister with breast cancer and a brother with an unknown cancer.  Ms. Khurana is unaware of previous family history of genetic testing for hereditary cancer risks. Patient's maternal ancestors are of African American descent, and paternal ancestors are of African American descent. There is no reported Ashkenazi Jewish ancestry. There is no known consanguinity.  GENETIC COUNSELING ASSESSMENT: Alicia Obrien is a 44 y.o. female with a family history of breast and prostate cancer which is somewhat suggestive of a hereditary cancer syndrome and predisposition to cancer given the number of people in the family with cancer and the ages of onset. We, therefore, discussed and recommended the following at today's visit.   DISCUSSION: We discussed that 5 - 10% of breast cancer is hereditary, with most cases associated with BRCA mutations.  There are other genes that can be associated with hereditary breast cancer syndromes.  These include ATM, CHEK2 and PALB2.  We discussed that testing is beneficial for several reasons including knowing how to follow individuals, and understand if other family members could be at risk for cancer and allow them to undergo genetic testing.   We reviewed the characteristics, features and inheritance patterns of hereditary cancer syndromes. We also  discussed genetic testing, including the appropriate family members to test, the process of testing, insurance coverage and turn-around-time for results. We discussed the implications of a negative, positive, carrier and/or variant of uncertain significant result. We recommended Alicia Obrien pursue genetic testing for the common hereditary cancer gene panel. The Common Hereditary Gene Panel offered by [facility] includes sequencing and/or deletion duplication testing of the following 48 genes: APC, ATM, AXIN2, BARD1, BMPR1A, BRCA1, BRCA2,  BRIP1, CDH1, CDK4, CDKN2A (p14ARF), CDKN2A (p16INK4a), CHEK2, CTNNA1, DICER1, EPCAM (Deletion/duplication testing only), GREM1 (promoter region deletion/duplication testing only), KIT, MEN1, MLH1, MSH2, MSH3, MSH6, MUTYH, NBN, NF1, NHTL1, PALB2, PDGFRA, PMS2, POLD1, POLE, PTEN, RAD50, RAD51C, RAD51D, RNF43, SDHB, SDHC, SDHD, SMAD4, SMARCA4. STK11, TP53, TSC1, TSC2, and VHL.  The following genes were evaluated for sequence changes only: SDHA and HOXB13 c.251G>A variant only.   Based on Alicia Obrien's family history of cancer, she meets medical criteria for genetic testing. Despite that she meets criteria, she may still have an out of pocket cost. We discussed that if her out of pocket cost for testing is over $100, the laboratory will call and confirm whether she wants to proceed with testing.  If the out of pocket cost of testing is less than $100 she will be billed by the genetic testing laboratory.   We discussed that some people do not want to undergo genetic testing due to fear of genetic discrimination.  A federal law called the Genetic Information Non-Discrimination Act (GINA) of 2008 helps protect individuals against genetic discrimination based on their genetic test results.  It impacts both health insurance and employment.  With health insurance, it protects against increased premiums, being kicked off insurance or being forced to take a test in order to be insured.  For  employment it protects against hiring, firing and promoting decisions based on genetic test results.  Health status due to a cancer diagnosis is not protected under GINA.   Based on the patient's family history, a statistical model (Tyrer Cusik) was used to estimate her risk of developing breast cancer. This estimates her lifetime risk of developing breast cancer to be approximately 22.4%. This estimation does not consider any genetic testing results.  The patient's lifetime breast cancer risk is a preliminary estimate based on available information using one of several models endorsed by the [place] (ACS). The ACS recommends consideration of breast MRI screening as an adjunct to mammography for patients at high risk (defined as 20% or greater lifetime risk).   Alicia Obrien has been determined to be at high risk for breast cancer.  Therefore, we recommend that annual screening with mammography and breast MRI be performed.  We discussed that Alicia Obrien should discuss her individual situation with her referring physician and determine a breast cancer screening plan with which they are both comfortable.     PLAN: After considering the risks, benefits, and limitations, Alicia Obrien provided informed consent to pursue genetic testing and the blood sample was sent to [facility] for analysis of the common hereditary cancer panel. Results should be available within approximately 2-3 weeks' time, at which point they will be disclosed by telephone to Alicia Obrien, as will any additional recommendations warranted by these results. Alicia Obrien will receive a summary of her genetic counseling visit and a copy of her results once available. This information will also be available in Epic.   Based on Alicia Obrien's family history, we recommended her paternal aunt, Gwenette Greet, who was diagnosed with breast cancer in her early 21's, have genetic counseling and testing. Alicia Obrien will let us know if we can be of any  assistance in coordinating genetic counseling and/or testing for this family member.   Lastly, we encouraged Alicia Obrien to remain in contact with cancer genetics annually so that we can continuously update the family history and inform her of any changes in cancer genetics and testing that may be of benefit for this family.  Alicia Obrien questions were answered to her satisfaction today. Our contact information was provided should additional questions or concerns arise. Thank you for the referral and allowing [place] to share in the care of your patient.   Fawn Desrocher P. Florene Glen, [place], [facility] Licensed, Insurance risk surveyor [name] phone: [phone]  The patient was seen for a total of 50 minutes in face-to-face genetic counseling.  This patient was discussed with Drs. Magrinat, Lindi Adie and/or Burr Medico who agrees with the above.    _______________________________________________________________________ For Office Staff:  Number of people involved in session: 2 Was an Intern/ student involved with case: yes: Wyatt Portela

## 2019-07-30 LAB — GENETIC SCREENING ORDER

## 2019-07-31 ENCOUNTER — Telehealth: Payer: Self-pay | Admitting: Obstetrics and Gynecology

## 2019-07-31 NOTE — Telephone Encounter (Signed)
Spoke with patient. Appointment to come in to review and sign consent for sterilization scheduled for 08/06/2019 at 1:30 pm with Dr.Jertson. Pre op scheduled for 09/03/2019 at 1:15 pm with Dr.Jertson. COIVD test scheduled for 09/13/2019 at 3:05 pm. TLH/BS/Cysto/possible treatment of endometriosis scheduled for 09/17/2019 at 11:30 am at [facility]. 1 week post op scheduled for 09/24/2019 at 1 pm with Dr.Jertson. 4 week post op scheduled for 10/15/2019 at 1 pm with Dr.Jertson. Patient is agreeable to all dates and times. Surgery instructions reviewed and to Eustace Pen to provide to the patient at her appointment on 08/06/2019.  Routing to provider and will close encounter.

## 2019-07-31 NOTE — Telephone Encounter (Signed)
Spoke with patient regarding surgery benefits. Patient acknowledges understanding of information presented. Patient is aware that benefits presented are for professional benefits only. Patient is aware that once surgery is scheduled, the hospital will call with separate benefits. Patient is aware of surgery cancellation policy.   Patient is ready to proceed with scheduling and is requesting surgery as soon as possible. Patient stated that she can be reached on her cell phone at [phone] today or between the hours of 1pm-2pm tomorrow.  Routing to [place], Therapist, sports, to proceed with scheduling.

## 2019-08-04 NOTE — Telephone Encounter (Signed)
The patient didn't go in for the genetic testing that was ordered by Roma Kayser. Can you please check with the patient, these results could alter the recommendations (if + oophorectomy would be recommended). We can further discuss at her appointment, see if she still wants the testing done. Thanks.

## 2019-08-06 ENCOUNTER — Ambulatory Visit (INDEPENDENT_AMBULATORY_CARE_PROVIDER_SITE_OTHER): Payer: BC Managed Care – PPO | Admitting: Obstetrics and Gynecology

## 2019-08-06 ENCOUNTER — Encounter: Payer: Self-pay | Admitting: Obstetrics and Gynecology

## 2019-08-06 ENCOUNTER — Other Ambulatory Visit: Payer: Self-pay

## 2019-08-06 VITALS — BP 118/68 | HR 104 | Ht 69.0 in | Wt 217.0 lb

## 2019-08-06 DIAGNOSIS — Z9189 Other specified personal risk factors, not elsewhere classified: Secondary | ICD-10-CM | POA: Diagnosis not present

## 2019-08-06 DIAGNOSIS — N939 Abnormal uterine and vaginal bleeding, unspecified: Secondary | ICD-10-CM

## 2019-08-06 DIAGNOSIS — Z803 Family history of malignant neoplasm of breast: Secondary | ICD-10-CM | POA: Diagnosis not present

## 2019-08-06 DIAGNOSIS — R102 Pelvic and perineal pain unspecified side: Secondary | ICD-10-CM

## 2019-08-06 NOTE — Progress Notes (Signed)
GYNECOLOGY  VISIT   HPI: 44 y.o.   Single Unavailable Not Hispanic or Latino  female   [phone] with Patient's last menstrual period was 07/08/2019.   here to sign sterilization consent. She is scheduled for a total laparoscopic hysterectomy with bilateral salpingectomies in 9/21 for abnormal uterine bleeding and pelvic pain. Her preoperative visit is next month.  She has a strong family history of breast cancer and was seen by Roma Kayser [employer]) last week. Her Tyrer Cusik risk of breast cancer was 22.4%. Her risk of BRCA 1 gene was 0.29% and risk of BRCA 2 gene was 0.46%. She was offered genetic testing and testing is pending.   GYNECOLOGIC HISTORY: Patient's last menstrual period was 07/08/2019. Contraception: vasectomy Menopausal hormone therapy: none         OB History    Gravida  5   Para  3   Term  3   Preterm      AB  2   Living  3     SAB  2   TAB      Ectopic      Multiple      Live Births                 Patient Active Problem List   Diagnosis Date Noted  . Family history of breast cancer   . Family history of prostate cancer   . Vertigo 03/27/2015  . Obesity 03/27/2015  . Essential hypertension 01/28/2015  . History of ovarian cyst 01/28/2015  . Hyperlipidemia 01/28/2015    Past Medical History:  Diagnosis Date  . Elevated cholesterol   . Family history of breast cancer   . Family history of prostate cancer   . Heart murmur   . Hypertension   . Vertigo     Past Surgical History:  Procedure Laterality Date  . NO PAST SURGERIES      Current Outpatient Medications  Medication Sig Dispense Refill  . acetaminophen (TYLENOL) 500 MG tablet Take 1 tablet (500 mg total) by mouth every 6 (six) hours as needed. 30 tablet 0  . amLODipine (NORVASC) 5 MG tablet Take 1 tablet (5 mg total) by mouth daily. 90 tablet 3  . atorvastatin (LIPITOR) 20 MG tablet Take 20 mg by mouth daily.    . cetirizine (ZYRTEC) 10 MG tablet Take 20 mg by  mouth daily.    . hydrochlorothiazide (HYDRODIURIL) 12.5 MG tablet Take 1 tablet by mouth daily.    . Liraglutide -Weight Management (SAXENDA) 18 MG/3ML SOPN 3 (THREE) MILLIGRAM UNDER SKIN DAILY, MAX DAILY DOSE  3 MILLIGRAM    . metroNIDAZOLE (FLAGYL) 500 MG tablet Take 1 tablet (500 mg total) by mouth 2 (two) times daily. 28 tablet 0   No current facility-administered medications for this visit.     ALLERGIES: Latex  Family History  Problem Relation Age of Onset  . Diabetes Mother   . Cancer Maternal Uncle        bone cancer   . Heart disease Maternal Uncle   . Dementia Maternal Grandmother   . Breast cancer Paternal Grandmother 57       d. in her 72s  . Breast cancer Cousin 68       mother's maternal first cousin  . Breast cancer Paternal Aunt        dx early 4s  . Breast cancer Maternal Great-grandmother   . Heart attack Father   . Prostate cancer Paternal Uncle 39  dx 2nd time in his 94s  . Prostate cancer Maternal Uncle 58  . Breast cancer Other        PGMs sister dx in her 55s  . Other Paternal Aunt        has breast lumps, watching her closely    Social History   Socioeconomic History  . Marital status: Single    Spouse name: Not on file  . Number of children: Not on file  . Years of education: Not on file  . Highest education level: Not on file  Occupational History  . Not on file  Tobacco Use  . Smoking status: Never Smoker  . Smokeless tobacco: Never Used  Vaping Use  . Vaping Use: Never used  Substance and Sexual Activity  . Alcohol use: No  . Drug use: No  . Sexual activity: Yes  Other Topics Concern  . Not on file  Social History Narrative  . Not on file   Social Determinants of Health   Financial Resource Strain:   . Difficulty of Paying Living Expenses:   Food Insecurity:   . Worried About Charity fundraiser in the Last Year:   . Arboriculturist in the Last Year:   Transportation Needs:   . Film/video editor (Medical):   Marland Kitchen  Lack of Transportation (Non-Medical):   Physical Activity:   . Days of Exercise per Week:   . Minutes of Exercise per Session:   Stress:   . Feeling of Stress :   Social Connections:   . Frequency of Communication with Friends and Family:   . Frequency of Social Gatherings with Friends and Family:   . Attends Religious Services:   . Active Member of Clubs or Organizations:   . Attends Archivist Meetings:   Marland Kitchen Marital Status:   Intimate Partner Violence:   . Fear of Current or Ex-Partner:   . Emotionally Abused:   Marland Kitchen Physically Abused:   . Sexually Abused:     Review of Systems  All other systems reviewed and are negative.   PHYSICAL EXAMINATION:    BP 118/68   Pulse 104   Ht 5' 9"  (1.753 m)   Wt (!) 217 lb (98.4 kg)   LMP 07/08/2019   SpO2 97%   BMI 32.05 kg/m     General appearance: alert, cooperative and appears stated age  ASSESSMENT Abnormal uterine bleeding and pelvic pain, hysterectomy scheduled in 9/21. Strong family history of breast cancer, her TC risk is 22.4%. Recent normal mammogram    PLAN We discussed that hysterectomy is a form of sterilization. Her husband has had a vasectomy and she has no desire for future pregnancy. All of her questions were answered. She has had genetic testing, depending on the results, we will further discuss possible oophorectomies Will schedule breast MRI in 6 months.

## 2019-08-06 NOTE — Telephone Encounter (Signed)
Left message to call Gerrod Maule at [phone]. 

## 2019-08-06 NOTE — Telephone Encounter (Signed)
Dr.Jertson spoke with patient at appointment today 08/06/2019 regarding genetic testing. Please see office visit note dated with today's date. Encounter closed.

## 2019-08-07 ENCOUNTER — Telehealth: Payer: Self-pay | Admitting: *Deleted

## 2019-08-07 DIAGNOSIS — Z9189 Other specified personal risk factors, not elsewhere classified: Secondary | ICD-10-CM

## 2019-08-07 DIAGNOSIS — Z803 Family history of malignant neoplasm of breast: Secondary | ICD-10-CM

## 2019-08-07 NOTE — Telephone Encounter (Signed)
-----   Message from Alicia Dom, MD sent at 08/06/2019  2:14 PM EDT ----- This patient has an elevated risk of breast cancer of 22.4%. She will need a breast MRI in 1/22.  Thanks, Sharee Pimple

## 2019-08-07 NOTE — Telephone Encounter (Signed)
Order placed for MRI breast bilateral w/wo contrast to be scheduled 01/2020.  Placed in [place] hold.    Call placed to patient, left detailed message, ok per dpr. Advised order hasv been placed for screening breast MRI at [facility] to be scheduled in 01/2020. [facility] will contact you directly to schedule. Once scheduled, our office will precert. Contact the office if you have any additional questions.    Routing to provider for final review. Patient is agreeable to disposition. Will close encounter.  Cc: Hayley Carder

## 2019-08-12 ENCOUNTER — Encounter: Payer: Self-pay | Admitting: Genetic Counselor

## 2019-08-12 DIAGNOSIS — Z1379 Encounter for other screening for genetic and chromosomal anomalies: Secondary | ICD-10-CM | POA: Insufficient documentation

## 2019-08-13 ENCOUNTER — Telehealth: Payer: Self-pay | Admitting: Genetic Counselor

## 2019-08-13 NOTE — Telephone Encounter (Signed)
LM on VM that results are back and to please call. 

## 2019-08-15 ENCOUNTER — Ambulatory Visit: Payer: Self-pay | Admitting: Genetic Counselor

## 2019-08-15 DIAGNOSIS — Z1379 Encounter for other screening for genetic and chromosomal anomalies: Secondary | ICD-10-CM

## 2019-08-15 NOTE — Telephone Encounter (Signed)
LM on VM that results are back and to please CB. 

## 2019-08-15 NOTE — Progress Notes (Addendum)
HPI:  Ms. Ritter was previously seen in the [facility] clinic due to a family history of cancer and concerns regarding a hereditary predisposition to cancer. Please refer to our prior cancer genetics clinic note for more information regarding our discussion, assessment and recommendations, at the time. Ms. Strohm recent genetic test results were disclosed to her, as were recommendations warranted by these results. These results and recommendations are discussed in more detail below.  CANCER HISTORY:  Oncology History   No history exists.    FAMILY HISTORY:  We obtained a detailed, 4-generation family history.  Significant diagnoses are listed below: Family History  Problem Relation Age of Onset  . Diabetes Mother   . Cancer Maternal Uncle        bone cancer   . Heart disease Maternal Uncle   . Dementia Maternal Grandmother   . Breast cancer Paternal Grandmother 55       d. in her 57s  . Breast cancer Cousin 32       mother's maternal first cousin  . Breast cancer Paternal Aunt        dx early 45s  . Breast cancer Maternal Great-grandmother   . Heart attack Father   . Prostate cancer Paternal Uncle 54       dx 2nd time in his 90s  . Prostate cancer Maternal Uncle 58  . Breast cancer Other        PGMs sister dx in her 32s  . Other Paternal Aunt        has breast lumps, watching her closely    The patient has two daughters and one son who are cancer free.  She is an only child.  Her father is deceased and her mother is living.  Her mother has three brothers.  One brother had prostate cancer at 45 and a second brother had bone cancer at 94.  The maternal grandparents are both deceased from non-cancer related concerns.  She reports that her mother had a maternal grandmother with breast cancer over 54 and a first cousin with breast cancer at 12.  The patient's father died in his 62's from a heart attack.  He had two brothers and three sisters.  One sister had breast  cancer in her early 72's.  It is unknown if she had genetic testing.  A second sister has breast lumps that are being watched.  One brother died of prostate cancer.  The paternal grandparents are deceased. The grandmother had breast cancer at 65. The grandmother had a sister with breast cancer and a brother with an unknown cancer.  Ms. Kita is unaware of previous family history of genetic testing for hereditary cancer risks. Patient's maternal ancestors are of African American descent, and paternal ancestors are of African American descent. There is no reported Ashkenazi Jewish ancestry. There is no known consanguinity.    GENETIC TEST RESULTS: Genetic testing reported out on August 12, 2019 through the common hereditary cancer panel found no pathogenic mutations. The Common Hereditary Gene Panel offered by [facility] includes sequencing and/or deletion duplication testing of the following 48 genes: APC, ATM, AXIN2, BARD1, BMPR1A, BRCA1, BRCA2, BRIP1, CDH1, CDK4, CDKN2A (p14ARF), CDKN2A (p16INK4a), CHEK2, CTNNA1, DICER1, EPCAM (Deletion/duplication testing only), GREM1 (promoter region deletion/duplication testing only), KIT, MEN1, MLH1, MSH2, MSH3, MSH6, MUTYH, NBN, NF1, NHTL1, PALB2, PDGFRA, PMS2, POLD1, POLE, PTEN, RAD50, RAD51C, RAD51D, RNF43, SDHB, SDHC, SDHD, SMAD4, SMARCA4. STK11, TP53, TSC1, TSC2, and VHL.  The following genes were evaluated for sequence  changes only: SDHA and HOXB13 c.251G>A variant only. The test report has been scanned into EPIC and is located under the Molecular Pathology section of the Results Review tab.  A portion of the result report is included below for reference.     We discussed with Ms. Nachtigal that because current genetic testing is not perfect, it is possible there may be a gene mutation in one of these genes that current testing cannot detect, but that chance is small.  We also discussed, that there could be another gene that has not yet been discovered, or that we  have not yet tested, that is responsible for the cancer diagnoses in the family. It is also possible there is a hereditary cause for the cancer in the family that Ms. Angell did not inherit and therefore was not identified in her testing.  Therefore, it is important to remain in touch with cancer genetics in the future so that we can continue to offer Ms. Schwertner the most up to date genetic testing.   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  At this time, it is unknown if this variant is associated with increased cancer risk or if this is a normal finding, but most variants such as this get reclassified to being inconsequential. It should not be used to make medical management decisions. With time, we suspect the lab will determine the significance of this variant, if any. If we do learn more about it, we will try to contact Ms. Finamore to discuss it further. However, it is important to stay in touch with us periodically and keep the address and phone number up to date.  ADDITIONAL GENETIC TESTING: We discussed with Ms. Deakin that there are other genes that are associated with increased cancer risk that can be analyzed. Should Ms. Piggee wish to pursue additional genetic testing, we are happy to discuss and coordinate this testing, at any time.    CANCER SCREENING RECOMMENDATIONS: Ms. Fister's test result is considered negative (normal).  This means that we have not identified a hereditary cause for her family history of cancer at this time. Most cancers happen by chance and this negative test suggests that her cancer may fall into this category.    While reassuring, this does not definitively rule out a hereditary predisposition to cancer. It is still possible that there could be genetic mutations that are undetectable by current technology. There could be genetic mutations in genes that have not been tested or  identified to increase cancer risk.  Therefore, it is recommended she continue to follow the cancer management and screening guidelines provided by her primary healthcare provider.   An individual's cancer risk and medical management are not determined by genetic test results alone. Overall cancer risk assessment incorporates additional factors, including personal medical history, family history, and any available genetic information that may result in a personalized plan for cancer prevention and surveillance.  Based on the patient's family history, a statistical model (Tyrer Cusik) was used to estimate her risk of developing breast cancer. This estimates her lifetime risk of developing breast cancer to be approximately 22.1%. The patient's lifetime breast cancer risk is a preliminary estimate based on available information using one of several models endorsed by the American Cancer Society (ACS). The ACS recommends consideration of breast MRI screening as an adjunct to mammography for patients at high risk (defined as 20% or greater lifetime risk).     Ms. Downum has been determined to be at high risk for breast cancer. Therefore, we recommend that annual screening with mammography and breast MRI be performed.  We discussed that Ms. Karaffa should discuss her individual situation with her referring physician and determine a breast cancer screening plan with which they are both comfortable.     RECOMMENDATIONS FOR FAMILY MEMBERS:  Individuals in this family might be at some increased risk of developing cancer, over the general population risk, simply due to the family history of cancer.  We recommended women in this family have a yearly mammogram beginning at age 40, or 10 years younger than the earliest onset of cancer, an annual clinical breast exam, and perform monthly breast self-exams. Women in this family should also have a gynecological exam as recommended by their primary provider. All family  members should be referred for colonoscopy starting at age 45.  FOLLOW-UP: Lastly, we discussed with Ms. Gola that cancer genetics is a rapidly advancing field and it is possible that new genetic tests will be appropriate for her and/or her family members in the future. We encouraged her to remain in contact with cancer genetics on an annual basis so we can update her personal and family histories and let her know of advances in cancer genetics that may benefit this family.   Our contact number was provided. Ms. Longnecker's questions were answered to her satisfaction, and she knows she is welcome to call us at anytime with additional questions or concerns.   Karen Powell, MS, LCGC Licensed, Certified Genetic Counselor [email or web address]  

## 2019-08-15 NOTE — Telephone Encounter (Signed)
Revealed negative genetic testing.  Discussed that we do not know why there is cancer in the family. It could be due to a different gene that we are not testing, or maybe our current technology may not be able to pick something up.  It will be important for her to keep in contact with genetics to keep up with whether additional testing may be needed.  Patient is still at high risk for breast cancer based on FH.  She reports that she will be followed by Dr. Talbert Nan as high risk.

## 2019-09-03 ENCOUNTER — Other Ambulatory Visit: Payer: Self-pay

## 2019-09-03 ENCOUNTER — Ambulatory Visit (INDEPENDENT_AMBULATORY_CARE_PROVIDER_SITE_OTHER): Payer: BC Managed Care – PPO | Admitting: Obstetrics and Gynecology

## 2019-09-03 ENCOUNTER — Encounter: Payer: Self-pay | Admitting: Obstetrics and Gynecology

## 2019-09-03 VITALS — BP 122/74 | HR 77 | Ht 69.0 in | Wt 221.0 lb

## 2019-09-03 DIAGNOSIS — N939 Abnormal uterine and vaginal bleeding, unspecified: Secondary | ICD-10-CM

## 2019-09-03 DIAGNOSIS — R102 Pelvic and perineal pain: Secondary | ICD-10-CM

## 2019-09-03 NOTE — Progress Notes (Signed)
GYNECOLOGY  VISIT   HPI: 44 y.o.   Single Unavailable Not Hispanic or Latino  female   [phone] with Patient's last menstrual period was 08/19/2019 (approximate).   here for a preoperative visit. The patient has a h/o AUB and pelvic pain. She desires definitive treatment.     She has been having daily pelvic pain since 2014. Irregular cycles since she was 37. Cycles every 4-8 weeks for 2 weeks, very heavy.   Ultrasound from June/25/2021: Uterus 10.1 x 7.5 x 5.7 cm, endometrium 1.5 cm. Right ovary is 4.7 x 4.0 x 3.1 cm, left ovary not seen.   Diffusely tender on BM exam. Treated with antibiotics for possible PID, no change in pain or tenderness. Normal WBC, negative cervical cultures.  Benign endometrial biopsy without plasma cells.   Last pap 06/02/19: normal.  Strong FH of breast cancer, negative genetic testing other than a variant of uncertain significance. TC risk is 22.1%  GYNECOLOGIC HISTORY: Patient's last menstrual period was 08/19/2019 (approximate). Contraception: none  Menopausal hormone therapy: none         OB History    Gravida  5   Para  3   Term  3   Preterm      AB  2   Living  3     SAB  2   TAB      Ectopic      Multiple      Live Births                 Patient Active Problem List   Diagnosis Date Noted  . Genetic testing 08/12/2019  . Increased risk of breast cancer 08/06/2019  . Family history of breast cancer   . Family history of prostate cancer   . Vertigo 03/27/2015  . Obesity 03/27/2015  . Essential hypertension 01/28/2015  . History of ovarian cyst 01/28/2015  . Hyperlipidemia 01/28/2015    Past Medical History:  Diagnosis Date  . Elevated cholesterol   . Family history of breast cancer   . Family history of prostate cancer   . Heart murmur   . Hypertension   . Vertigo     Past Surgical History:  Procedure Laterality Date  . NO PAST SURGERIES      Current Outpatient Medications  Medication Sig Dispense Refill  .  acetaminophen (TYLENOL) 500 MG tablet Take 1 tablet (500 mg total) by mouth every 6 (six) hours as needed. 30 tablet 0  . amLODipine (NORVASC) 5 MG tablet Take 1 tablet (5 mg total) by mouth daily. 90 tablet 3  . atorvastatin (LIPITOR) 20 MG tablet Take 20 mg by mouth daily.    . cetirizine (ZYRTEC) 10 MG tablet Take 20 mg by mouth daily.    . hydrochlorothiazide (HYDRODIURIL) 12.5 MG tablet Take 1 tablet by mouth daily.     No current facility-administered medications for this visit.     ALLERGIES: Latex  Family History  Problem Relation Age of Onset  . Diabetes Mother   . Cancer Maternal Uncle        bone cancer   . Heart disease Maternal Uncle   . Dementia Maternal Grandmother   . Breast cancer Paternal Grandmother 54       d. in her 66s  . Breast cancer Cousin 2       mother's maternal first cousin  . Breast cancer Paternal Aunt        dx early 84s  . Breast cancer Maternal Great-grandmother   .  Heart attack Father   . Prostate cancer Paternal Uncle 32       dx 2nd time in his 44s  . Prostate cancer Maternal Uncle 58  . Breast cancer Other        PGMs sister dx in her 62s  . Other Paternal Aunt        has breast lumps, watching her closely    Social History   Socioeconomic History  . Marital status: Single    Spouse name: Not on file  . Number of children: Not on file  . Years of education: Not on file  . Highest education level: Not on file  Occupational History  . Not on file  Tobacco Use  . Smoking status: Never Smoker  . Smokeless tobacco: Never Used  Vaping Use  . Vaping Use: Never used  Substance and Sexual Activity  . Alcohol use: No  . Drug use: No  . Sexual activity: Yes  Other Topics Concern  . Not on file  Social History Narrative  . Not on file   Social Determinants of Health   Financial Resource Strain:   . Difficulty of Paying Living Expenses: Not on file  Food Insecurity:   . Worried About Charity fundraiser in the Last Year: Not on  file  . Ran Out of Food in the Last Year: Not on file  Transportation Needs:   . Lack of Transportation (Medical): Not on file  . Lack of Transportation (Non-Medical): Not on file  Physical Activity:   . Days of Exercise per Week: Not on file  . Minutes of Exercise per Session: Not on file  Stress:   . Feeling of Stress : Not on file  Social Connections:   . Frequency of Communication with Friends and Family: Not on file  . Frequency of Social Gatherings with Friends and Family: Not on file  . Attends Religious Services: Not on file  . Active Member of Clubs or Organizations: Not on file  . Attends Archivist Meetings: Not on file  . Marital Status: Not on file  Intimate Partner Violence:   . Fear of Current or Ex-Partner: Not on file  . Emotionally Abused: Not on file  . Physically Abused: Not on file  . Sexually Abused: Not on file    Review of Systems  All other systems reviewed and are negative.   PHYSICAL EXAMINATION:    BP 122/74   Pulse 77   Ht 5\' 9"  (1.753 m)   Wt 221 lb (100.2 kg)   LMP 08/19/2019 (Approximate)   SpO2 99%   BMI 32.64 kg/m     General appearance: alert, cooperative and appears stated age Neck: no adenopathy, supple, symmetrical, trachea midline and thyroid normal to inspection and palpation Heart: regular rate and rhythm Lungs: CTAB Abdomen: soft, non-tender; bowel sounds normal; no masses,  no organomegaly Extremities: normal, atraumatic, no cyanosis Skin: normal color, texture and turgor, no rashes or lesions Lymph: normal cervical supraclavicular and inguinal nodes Neurologic: grossly normal   ASSESSMENT Chronic pelvic pain, menometrorrhagia, negative w/u H/O HTN    PLAN Discussed total laparoscopic hysterectomy, bilateral salpingectomies, possible treatment of endometriosis and cystoscopy. Reviewed the risks of the procedure, including infection, bleeding, damage to bowel/badder/vessels/ureters.  Discussed the possible need  for laparotomy. Discussed post operative recovery and risk of cuff dehiscence. All of her questions were answered   CC: Lucky Cowboy, FNP

## 2019-09-03 NOTE — H&P (Signed)
GYNECOLOGY  VISIT   HPI: 44 y.o.   Single Unavailable Not Hispanic or Latino  female   [phone] with Patient's last menstrual period was 08/19/2019 (approximate).   here for a preoperative visit. The patient has a h/o AUB and pelvic pain. She desires definitive treatment.     She has been having daily pelvic pain since 2014. Irregular cycles since she was 37. Cycles every 4-8 weeks for 2 weeks, very heavy.   Ultrasound from June/25/2021: Uterus 10.1 x 7.5 x 5.7 cm, endometrium 1.5 cm. Right ovary is 4.7 x 4.0 x 3.1 cm, left ovary not seen.   Diffusely tender on BM exam. Treated with antibiotics for possible PID, no change in pain or tenderness. Normal WBC, negative cervical cultures.  Benign endometrial biopsy without plasma cells.   Last pap 06/02/19: normal.  Strong FH of breast cancer, negative genetic testing other than a variant of uncertain significance. TC risk is 22.1%  GYNECOLOGIC HISTORY: Patient's last menstrual period was 08/19/2019 (approximate). Contraception: none  Menopausal hormone therapy: none         OB History    Gravida  5   Para  3   Term  3   Preterm      AB  2   Living  3     SAB  2   TAB      Ectopic      Multiple      Live Births                 Patient Active Problem List   Diagnosis Date Noted  . Genetic testing 08/12/2019  . Increased risk of breast cancer 08/06/2019  . Family history of breast cancer   . Family history of prostate cancer   . Vertigo 03/27/2015  . Obesity 03/27/2015  . Essential hypertension 01/28/2015  . History of ovarian cyst 01/28/2015  . Hyperlipidemia 01/28/2015    Past Medical History:  Diagnosis Date  . Elevated cholesterol   . Family history of breast cancer   . Family history of prostate cancer   . Heart murmur   . Hypertension   . Vertigo     Past Surgical History:  Procedure Laterality Date  . NO PAST SURGERIES      Current Outpatient Medications  Medication Sig Dispense Refill  .  acetaminophen (TYLENOL) 500 MG tablet Take 1 tablet (500 mg total) by mouth every 6 (six) hours as needed. 30 tablet 0  . amLODipine (NORVASC) 5 MG tablet Take 1 tablet (5 mg total) by mouth daily. 90 tablet 3  . atorvastatin (LIPITOR) 20 MG tablet Take 20 mg by mouth daily.    . cetirizine (ZYRTEC) 10 MG tablet Take 20 mg by mouth daily.    . hydrochlorothiazide (HYDRODIURIL) 12.5 MG tablet Take 1 tablet by mouth daily.     No current facility-administered medications for this visit.     ALLERGIES: Latex  Family History  Problem Relation Age of Onset  . Diabetes Mother   . Cancer Maternal Uncle        bone cancer   . Heart disease Maternal Uncle   . Dementia Maternal Grandmother   . Breast cancer Paternal Grandmother 35       d. in her 71s  . Breast cancer Cousin 2       mother's maternal first cousin  . Breast cancer Paternal Aunt        dx early 87s  . Breast cancer Maternal Great-grandmother   .  Heart attack Father   . Prostate cancer Paternal Uncle 35       dx 2nd time in his 71s  . Prostate cancer Maternal Uncle 58  . Breast cancer Other        PGMs sister dx in her 28s  . Other Paternal Aunt        has breast lumps, watching her closely    Social History   Socioeconomic History  . Marital status: Single    Spouse name: Not on file  . Number of children: Not on file  . Years of education: Not on file  . Highest education level: Not on file  Occupational History  . Not on file  Tobacco Use  . Smoking status: Never Smoker  . Smokeless tobacco: Never Used  Vaping Use  . Vaping Use: Never used  Substance and Sexual Activity  . Alcohol use: No  . Drug use: No  . Sexual activity: Yes  Other Topics Concern  . Not on file  Social History Narrative  . Not on file   Social Determinants of Health   Financial Resource Strain:   . Difficulty of Paying Living Expenses: Not on file  Food Insecurity:   . Worried About Charity fundraiser in the Last Year: Not on  file  . Ran Out of Food in the Last Year: Not on file  Transportation Needs:   . Lack of Transportation (Medical): Not on file  . Lack of Transportation (Non-Medical): Not on file  Physical Activity:   . Days of Exercise per Week: Not on file  . Minutes of Exercise per Session: Not on file  Stress:   . Feeling of Stress : Not on file  Social Connections:   . Frequency of Communication with Friends and Family: Not on file  . Frequency of Social Gatherings with Friends and Family: Not on file  . Attends Religious Services: Not on file  . Active Member of Clubs or Organizations: Not on file  . Attends Archivist Meetings: Not on file  . Marital Status: Not on file  Intimate Partner Violence:   . Fear of Current or Ex-Partner: Not on file  . Emotionally Abused: Not on file  . Physically Abused: Not on file  . Sexually Abused: Not on file    Review of Systems  All other systems reviewed and are negative.   PHYSICAL EXAMINATION:    BP 122/74   Pulse 77   Ht 5\' 9"  (1.753 m)   Wt 221 lb (100.2 kg)   LMP 08/19/2019 (Approximate)   SpO2 99%   BMI 32.64 kg/m     General appearance: alert, cooperative and appears stated age Neck: no adenopathy, supple, symmetrical, trachea midline and thyroid normal to inspection and palpation Heart: regular rate and rhythm Lungs: CTAB Abdomen: soft, non-tender; bowel sounds normal; no masses,  no organomegaly Extremities: normal, atraumatic, no cyanosis Skin: normal color, texture and turgor, no rashes or lesions Lymph: normal cervical supraclavicular and inguinal nodes Neurologic: grossly normal   ASSESSMENT Chronic pelvic pain, menometrorrhagia, negative w/u H/O HTN    PLAN Discussed total laparoscopic hysterectomy, bilateral salpingectomies, possible treatment of endometriosis and cystoscopy. Reviewed the risks of the procedure, including infection, bleeding, damage to bowel/badder/vessels/ureters.  Discussed the possible need  for laparotomy. Discussed post operative recovery and risk of cuff dehiscence. All of her questions were answered   CC: Lucky Cowboy, FNP

## 2019-09-06 ENCOUNTER — Other Ambulatory Visit (HOSPITAL_COMMUNITY): Payer: Self-pay | Admitting: Endocrinology

## 2019-09-11 NOTE — Progress Notes (Signed)
    Alicia Obrien  09/11/2019      Your procedure is scheduled on   09/17/2019   Report to [facility].  Call this number if you have problems the morning of [phone]  OUR ADDRESS IS [place], WE ARE [facility].   Remember:  Do not eat food  after midnight. Clear liquids from 52midnite until 0830am morning of surgery then nothing by mouth.      CLEAR LIQUID DIET   Foods Allowed                                                                    Coffee and tea, regular and decaf                             Plain Jell-O any favor except red or purple                                        Fruit ices (not with fruit pulp)                                      Iced Popsicles                                   Carbonated beverages, regular and diet                                    Cranberry, grape and apple juices Sports drinks like Gatorade Lightly seasoned clear broth or consume(fat free) Sugar, honey syrup                                              _____________________________________________________________________    Take these medicines the morning of surgery with A SIP OF WATER    Amlodipine, Zyrtec   Do not wear jewelry, make-up or nail polish.  Do not wear lotions, powders, or perfumes, or deoderant.  Do not shave 48 hours prior to surgery.  Men may shave face and neck.  Do not bring valuables to the hospital.  [facility] is not responsible for any belongings or valuables.  Contacts, dentures or bridgework may not be worn into surgery.  Leave your suitcase in the car.  After surgery it may be brought to your room.  For patients admitted to the hospital, discharge time will be determined by your treatment team.  Patients discharged the day of surgery will not be allowed to drive home.

## 2019-09-13 ENCOUNTER — Encounter (HOSPITAL_COMMUNITY): Payer: Self-pay

## 2019-09-13 ENCOUNTER — Encounter (HOSPITAL_COMMUNITY)
Admission: RE | Admit: 2019-09-13 | Discharge: 2019-09-13 | Disposition: A | Discharge status: Home / Self Care | Payer: BC Managed Care – PPO | Source: Ambulatory Visit | Attending: Obstetrics and Gynecology | Admitting: Obstetrics and Gynecology

## 2019-09-13 ENCOUNTER — Other Ambulatory Visit: Payer: Self-pay

## 2019-09-13 ENCOUNTER — Other Ambulatory Visit (HOSPITAL_COMMUNITY)
Admission: RE | Admit: 2019-09-13 | Discharge: 2019-09-13 | Disposition: A | Discharge status: Home / Self Care | Payer: BC Managed Care – PPO | Source: Ambulatory Visit | Attending: Obstetrics and Gynecology | Admitting: Obstetrics and Gynecology

## 2019-09-13 DIAGNOSIS — Z20822 Contact with and (suspected) exposure to covid-19: Secondary | ICD-10-CM | POA: Insufficient documentation

## 2019-09-13 DIAGNOSIS — Z01818 Encounter for other preprocedural examination: Secondary | ICD-10-CM | POA: Diagnosis present

## 2019-09-13 LAB — COMPREHENSIVE METABOLIC PANEL
ALT: 21 U/L (ref 0–44)
AST: 18 U/L (ref 15–41)
Albumin: 3.9 g/dL (ref 3.5–5.0)
Alkaline Phosphatase: 57 U/L (ref 38–126)
Anion gap: 9 (ref 5–15)
BUN: 13 mg/dL (ref 6–20)
CO2: 25 mmol/L (ref 22–32)
Calcium: 8.9 mg/dL (ref 8.9–10.3)
Chloride: 104 mmol/L (ref 98–111)
Creatinine, Ser: 0.56 mg/dL (ref 0.44–1.00)
GFR calc Af Amer: >60 mL/min (ref >60)
GFR calc non Af Amer: >60 mL/min (ref >60)
Glucose, Bld: 94 mg/dL (ref 70–99)
Potassium: 3.2 mmol/L — ABNORMAL LOW (ref 3.5–5.1)
Sodium: 138 mmol/L (ref 135–145)
Total Bilirubin: 0.8 mg/dL (ref 0.3–1.2)
Total Protein: 7.1 g/dL (ref 6.5–8.1)

## 2019-09-13 LAB — CBC
HCT: 38.6 % (ref 36.0–46.0)
Hemoglobin: 12.4 g/dL (ref 12.0–15.0)
MCH: 27.6 pg (ref 26.0–34.0)
MCHC: 32.1 g/dL (ref 30.0–36.0)
MCV: 86 fL (ref 80.0–100.0)
Platelets: 249 10*3/uL (ref 150–400)
RBC: 4.49 MIL/uL (ref 3.87–5.11)
RDW: 13.6 % (ref 11.5–15.5)
WBC: 6.5 10*3/uL (ref 4.0–10.5)
nRBC: 0 % (ref 0.0–0.2)

## 2019-09-13 LAB — SARS CORONAVIRUS 2 (TAT 6-24 HRS): SARS Coronavirus 2: NEGATIVE

## 2019-09-13 NOTE — Progress Notes (Signed)
CMP doen 09/13/2019 routed via epic to Dr Talbert Nan.

## 2019-09-13 NOTE — Progress Notes (Signed)
[facility] - Preparing for Surgery Before surgery, you can play an important role.  Because skin is not sterile, your skin needs to be as free of germs as possible.  You can reduce the number of germs on your skin by washing with CHG (chlorahexidine gluconate) soap before surgery.  CHG is an antiseptic cleaner which kills germs and bonds with the skin to continue killing germs even after washing. Please DO NOT use if you have an allergy to CHG or antibacterial soaps.  If your skin becomes reddened/irritated stop using the CHG and inform your nurse when you arrive at Short Stay. Do not shave (including legs and underarms) for at least 48 hours prior to the first CHG shower.  You may shave your face/neck. Please follow these instructions carefully:  1.  Shower with CHG Soap the night before surgery and the  morning of Surgery.  2.  If you choose to wash your hair, wash your hair first as usual with your  normal  shampoo.  3.  After you shampoo, rinse your hair and body thoroughly to remove the  shampoo.                           4.  Use CHG as you would any other liquid soap.  You can apply chg directly  to the skin and wash                       Gently with a scrungie or clean washcloth.  5.  Apply the CHG Soap to your body ONLY FROM THE NECK DOWN.   Do not use on face/ open                           Wound or open sores. Avoid contact with eyes, ears mouth and genitals (private parts).                       Wash face,  Genitals (private parts) with your normal soap.             6.  Wash thoroughly, paying special attention to the area where your surgery  will be performed.  7.  Thoroughly rinse your body with warm water from the neck down.  8.  DO NOT shower/wash with your normal soap after using and rinsing off  the CHG Soap.                9.  Pat yourself dry with a clean towel.            10.  Wear clean pajamas.            11.  Place clean sheets on your bed the night of your first shower and  do not  sleep with pets. Day of Surgery : Do not apply any lotions/deodorants the morning of surgery.  Please wear clean clothes to the hospital/surgery center.  FAILURE TO FOLLOW THESE INSTRUCTIONS MAY RESULT IN THE CANCELLATION OF YOUR SURGERY PATIENT SIGNATURE_________________________________  NURSE SIGNATURE__________________________________  ________________________________________________________________________  

## 2019-09-17 ENCOUNTER — Ambulatory Visit (HOSPITAL_BASED_OUTPATIENT_CLINIC_OR_DEPARTMENT_OTHER)
Admission: RE | Admit: 2019-09-17 | Discharge: 2019-09-18 | Disposition: A | Discharge status: Home / Self Care | Payer: BC Managed Care – PPO | Attending: Obstetrics and Gynecology | Admitting: Obstetrics and Gynecology

## 2019-09-17 ENCOUNTER — Ambulatory Visit (HOSPITAL_BASED_OUTPATIENT_CLINIC_OR_DEPARTMENT_OTHER): Payer: BC Managed Care – PPO | Admitting: Anesthesiology

## 2019-09-17 ENCOUNTER — Encounter (HOSPITAL_BASED_OUTPATIENT_CLINIC_OR_DEPARTMENT_OTHER): Payer: Self-pay | Admitting: Obstetrics and Gynecology

## 2019-09-17 ENCOUNTER — Other Ambulatory Visit: Payer: Self-pay

## 2019-09-17 ENCOUNTER — Encounter (HOSPITAL_BASED_OUTPATIENT_CLINIC_OR_DEPARTMENT_OTHER)
Admission: RE | Disposition: A | Discharge status: Home / Self Care | Payer: Self-pay | Source: Home / Self Care | Attending: Obstetrics and Gynecology

## 2019-09-17 DIAGNOSIS — N921 Excessive and frequent menstruation with irregular cycle: Secondary | ICD-10-CM | POA: Insufficient documentation

## 2019-09-17 DIAGNOSIS — N838 Other noninflammatory disorders of ovary, fallopian tube and broad ligament: Secondary | ICD-10-CM

## 2019-09-17 DIAGNOSIS — E7800 Pure hypercholesterolemia, unspecified: Secondary | ICD-10-CM | POA: Diagnosis not present

## 2019-09-17 DIAGNOSIS — E669 Obesity, unspecified: Secondary | ICD-10-CM | POA: Insufficient documentation

## 2019-09-17 DIAGNOSIS — R102 Pelvic and perineal pain: Secondary | ICD-10-CM

## 2019-09-17 DIAGNOSIS — N939 Abnormal uterine and vaginal bleeding, unspecified: Secondary | ICD-10-CM | POA: Diagnosis not present

## 2019-09-17 DIAGNOSIS — I10 Essential (primary) hypertension: Secondary | ICD-10-CM | POA: Insufficient documentation

## 2019-09-17 DIAGNOSIS — Z79899 Other long term (current) drug therapy: Secondary | ICD-10-CM | POA: Insufficient documentation

## 2019-09-17 DIAGNOSIS — Z6832 Body mass index (BMI) 32.0-32.9, adult: Secondary | ICD-10-CM | POA: Diagnosis not present

## 2019-09-17 DIAGNOSIS — E785 Hyperlipidemia, unspecified: Secondary | ICD-10-CM | POA: Diagnosis not present

## 2019-09-17 DIAGNOSIS — G8929 Other chronic pain: Secondary | ICD-10-CM | POA: Insufficient documentation

## 2019-09-17 DIAGNOSIS — N941 Unspecified dyspareunia: Secondary | ICD-10-CM

## 2019-09-17 DIAGNOSIS — Z90710 Acquired absence of both cervix and uterus: Secondary | ICD-10-CM | POA: Diagnosis present

## 2019-09-17 HISTORY — PX: CYSTOSCOPY: SHX5120

## 2019-09-17 HISTORY — PX: TOTAL LAPAROSCOPIC HYSTERECTOMY WITH SALPINGECTOMY: SHX6742

## 2019-09-17 LAB — CBC
HCT: 38.3 % (ref 36.0–46.0)
Hemoglobin: 12.9 g/dL (ref 12.0–15.0)
MCH: 27.9 pg (ref 26.0–34.0)
MCHC: 33.7 g/dL (ref 30.0–36.0)
MCV: 82.9 fL (ref 80.0–100.0)
Platelets: 232 10*3/uL (ref 150–400)
RBC: 4.62 MIL/uL (ref 3.87–5.11)
RDW: 13.3 % (ref 11.5–15.5)
WBC: 15.4 10*3/uL — ABNORMAL HIGH (ref 4.0–10.5)
nRBC: 0 % (ref 0.0–0.2)

## 2019-09-17 LAB — TYPE AND SCREEN
ABO/RH(D): B POS
Antibody Screen: NEGATIVE

## 2019-09-17 LAB — POCT PREGNANCY, URINE: Preg Test, Ur: NEGATIVE

## 2019-09-17 SURGERY — HYSTERECTOMY, TOTAL, LAPAROSCOPIC, WITH SALPINGECTOMY
Anesthesia: General | Site: Bladder

## 2019-09-17 MED ORDER — DEXAMETHASONE SODIUM PHOSPHATE 10 MG/ML IJ SOLN
INTRAMUSCULAR | Status: AC
  Filled 2019-09-17: qty 1

## 2019-09-17 MED ORDER — OXYCODONE HCL 5 MG PO TABS
ORAL_TABLET | ORAL | Status: AC
Start: 2019-09-17 — End: ?
  Filled 2019-09-17: qty 1

## 2019-09-17 MED ORDER — BUPIVACAINE HCL (PF) 0.25 % IJ SOLN
INTRAMUSCULAR | Status: DC | PRN
  Administered 2019-09-17: 8 mL

## 2019-09-17 MED ORDER — ROCURONIUM BROMIDE 10 MG/ML (PF) SYRINGE
PREFILLED_SYRINGE | INTRAVENOUS | Status: AC
  Filled 2019-09-17: qty 10

## 2019-09-17 MED ORDER — PROPOFOL 10 MG/ML IV BOLUS
INTRAVENOUS | Status: DC | PRN
  Administered 2019-09-17: 150 mg via INTRAVENOUS

## 2019-09-17 MED ORDER — SODIUM CHLORIDE 0.9 % IV SOLN
INTRAVENOUS | Status: DC | PRN
  Administered 2019-09-17: 60 mL

## 2019-09-17 MED ORDER — ONDANSETRON HCL 4 MG/2ML IJ SOLN
INTRAMUSCULAR | Status: DC | PRN
  Administered 2019-09-17: 4 mg via INTRAVENOUS

## 2019-09-17 MED ORDER — AMISULPRIDE (ANTIEMETIC) 5 MG/2ML IV SOLN
INTRAVENOUS | Status: AC
  Filled 2019-09-17: qty 2

## 2019-09-17 MED ORDER — ATORVASTATIN CALCIUM 20 MG PO TABS
20.0000 mg | ORAL_TABLET | Freq: Every day | ORAL | Status: DC
  Filled 2019-09-17: qty 1

## 2019-09-17 MED ORDER — PHENYLEPHRINE HCL (PRESSORS) 10 MG/ML IV SOLN
INTRAVENOUS | Status: DC | PRN
  Administered 2019-09-17 (×2): 80 ug via INTRAVENOUS

## 2019-09-17 MED ORDER — SODIUM CHLORIDE 0.9 % IR SOLN
Status: DC | PRN
  Administered 2019-09-17: 1000 mL

## 2019-09-17 MED ORDER — LACTATED RINGERS IV SOLN: INTRAVENOUS | Status: DC

## 2019-09-17 MED ORDER — SUGAMMADEX SODIUM 200 MG/2ML IV SOLN
INTRAVENOUS | Status: DC | PRN
  Administered 2019-09-17: 200 mg via INTRAVENOUS

## 2019-09-17 MED ORDER — DOCUSATE SODIUM 100 MG PO CAPS
100.0000 mg | ORAL_CAPSULE | Freq: Two times a day (BID) | ORAL | Status: DC
  Administered 2019-09-17: 100 mg via ORAL

## 2019-09-17 MED ORDER — CHLORHEXIDINE GLUCONATE 0.12 % MT SOLN: 15.0000 mL | Freq: Once | OROMUCOSAL | Status: DC

## 2019-09-17 MED ORDER — ENOXAPARIN SODIUM 40 MG/0.4ML ~~LOC~~ SOLN
40.0000 mg | SUBCUTANEOUS | Status: AC
  Administered 2019-09-17: 40 mg via SUBCUTANEOUS

## 2019-09-17 MED ORDER — AMISULPRIDE (ANTIEMETIC) 5 MG/2ML IV SOLN
10.0000 mg | Freq: Once | INTRAVENOUS | Status: AC
  Administered 2019-09-17: 10 mg via INTRAVENOUS

## 2019-09-17 MED ORDER — KETAMINE HCL 10 MG/ML IJ SOLN
INTRAMUSCULAR | Status: DC | PRN
  Administered 2019-09-17: 40 mg via INTRAVENOUS

## 2019-09-17 MED ORDER — LORATADINE 10 MG PO TABS: 10.0000 mg | ORAL_TABLET | Freq: Every day | ORAL | Status: DC

## 2019-09-17 MED ORDER — HYDROMORPHONE HCL 1 MG/ML IJ SOLN: 0.2000 mg | INTRAMUSCULAR | Status: DC | PRN

## 2019-09-17 MED ORDER — LIDOCAINE HCL (CARDIAC) PF 100 MG/5ML IV SOSY
PREFILLED_SYRINGE | INTRAVENOUS | Status: DC | PRN
  Administered 2019-09-17: 60 mg via INTRAVENOUS

## 2019-09-17 MED ORDER — ACETAMINOPHEN 500 MG PO TABS
1000.0000 mg | ORAL_TABLET | Freq: Four times a day (QID) | ORAL | Status: DC
  Administered 2019-09-17 – 2019-09-18 (×3): 1000 mg via ORAL

## 2019-09-17 MED ORDER — PHENYLEPHRINE 40 MCG/ML (10ML) SYRINGE FOR IV PUSH (FOR BLOOD PRESSURE SUPPORT)
PREFILLED_SYRINGE | INTRAVENOUS | Status: AC
  Filled 2019-09-17: qty 10

## 2019-09-17 MED ORDER — ORAL CARE MOUTH RINSE: 15.0000 mL | Freq: Once | OROMUCOSAL | Status: DC

## 2019-09-17 MED ORDER — MENTHOL 3 MG MT LOZG: 1.0000 | LOZENGE | OROMUCOSAL | Status: DC | PRN

## 2019-09-17 MED ORDER — GABAPENTIN 300 MG PO CAPS
ORAL_CAPSULE | ORAL | Status: AC
  Filled 2019-09-17: qty 1

## 2019-09-17 MED ORDER — OXYCODONE HCL 5 MG PO TABS
ORAL_TABLET | ORAL | Status: AC
Start: 2019-09-17 — End: ?
  Filled 2019-09-17: qty 2

## 2019-09-17 MED ORDER — MIDAZOLAM HCL 2 MG/2ML IJ SOLN
INTRAMUSCULAR | Status: AC
  Filled 2019-09-17: qty 2

## 2019-09-17 MED ORDER — DEXAMETHASONE SODIUM PHOSPHATE 10 MG/ML IJ SOLN
INTRAMUSCULAR | Status: DC | PRN
  Administered 2019-09-17: 8 mg via INTRAVENOUS

## 2019-09-17 MED ORDER — POVIDONE-IODINE 10 % EX SWAB: 2.0000 "application " | Freq: Once | CUTANEOUS | Status: DC

## 2019-09-17 MED ORDER — OXYCODONE HCL 5 MG PO TABS
5.0000 mg | ORAL_TABLET | ORAL | Status: DC | PRN
  Administered 2019-09-17: 10 mg via ORAL
  Administered 2019-09-17: 5 mg via ORAL
  Administered 2019-09-18 (×2): 10 mg via ORAL

## 2019-09-17 MED ORDER — KETOROLAC TROMETHAMINE 30 MG/ML IJ SOLN
INTRAMUSCULAR | Status: AC
  Filled 2019-09-17: qty 1

## 2019-09-17 MED ORDER — WHITE PETROLATUM EX OINT
TOPICAL_OINTMENT | CUTANEOUS | Status: AC
  Filled 2019-09-17: qty 5

## 2019-09-17 MED ORDER — 0.9 % SODIUM CHLORIDE (POUR BTL) OPTIME
TOPICAL | Status: DC | PRN
  Administered 2019-09-17: 1000 mL

## 2019-09-17 MED ORDER — KCL IN DEXTROSE-NACL 20-5-0.45 MEQ/L-%-% IV SOLN
INTRAVENOUS | Status: DC
  Filled 2019-09-17 (×2): qty 1000

## 2019-09-17 MED ORDER — KETOROLAC TROMETHAMINE 30 MG/ML IJ SOLN
INTRAMUSCULAR | Status: DC | PRN
  Administered 2019-09-17: 30 mg via INTRAVENOUS

## 2019-09-17 MED ORDER — KETAMINE HCL 10 MG/ML IJ SOLN
INTRAMUSCULAR | Status: AC
  Filled 2019-09-17: qty 1

## 2019-09-17 MED ORDER — HEMOSTATIC AGENTS (NO CHARGE) OPTIME
TOPICAL | Status: DC | PRN
  Administered 2019-09-17: 1 via TOPICAL

## 2019-09-17 MED ORDER — IBUPROFEN 800 MG PO TABS: 800.0000 mg | ORAL_TABLET | Freq: Four times a day (QID) | ORAL | Status: DC

## 2019-09-17 MED ORDER — ENOXAPARIN SODIUM 40 MG/0.4ML ~~LOC~~ SOLN
SUBCUTANEOUS | Status: AC
  Filled 2019-09-17: qty 0.4

## 2019-09-17 MED ORDER — FENTANYL CITRATE (PF) 100 MCG/2ML IJ SOLN
INTRAMUSCULAR | Status: DC | PRN
Start: 2019-09-17 — End: 2019-09-17
  Administered 2019-09-17: 50 ug via INTRAVENOUS
  Administered 2019-09-17 (×2): 25 ug via INTRAVENOUS
  Administered 2019-09-17 (×4): 50 ug via INTRAVENOUS

## 2019-09-17 MED ORDER — LIDOCAINE 2% (20 MG/ML) 5 ML SYRINGE
INTRAMUSCULAR | Status: AC
  Filled 2019-09-17: qty 5

## 2019-09-17 MED ORDER — AMLODIPINE BESYLATE 5 MG PO TABS
5.0000 mg | ORAL_TABLET | Freq: Every day | ORAL | Status: DC
  Filled 2019-09-17: qty 1

## 2019-09-17 MED ORDER — FENTANYL CITRATE (PF) 100 MCG/2ML IJ SOLN: 25.0000 ug | INTRAMUSCULAR | Status: DC | PRN

## 2019-09-17 MED ORDER — ROCURONIUM BROMIDE 100 MG/10ML IV SOLN
INTRAVENOUS | Status: DC | PRN
  Administered 2019-09-17: 100 mg via INTRAVENOUS

## 2019-09-17 MED ORDER — FENTANYL CITRATE (PF) 250 MCG/5ML IJ SOLN
INTRAMUSCULAR | Status: AC
  Filled 2019-09-17: qty 5

## 2019-09-17 MED ORDER — HYDROCHLOROTHIAZIDE 25 MG PO TABS
12.5000 mg | ORAL_TABLET | Freq: Every day | ORAL | Status: DC
  Administered 2019-09-17: 12.5 mg via ORAL
  Filled 2019-09-17: qty 0.5

## 2019-09-17 MED ORDER — MIDAZOLAM HCL 5 MG/5ML IJ SOLN
INTRAMUSCULAR | Status: DC | PRN
  Administered 2019-09-17: 2 mg via INTRAVENOUS

## 2019-09-17 MED ORDER — GABAPENTIN 300 MG PO CAPS
300.0000 mg | ORAL_CAPSULE | ORAL | Status: AC
  Administered 2019-09-17: 300 mg via ORAL

## 2019-09-17 MED ORDER — KETOROLAC TROMETHAMINE 30 MG/ML IJ SOLN
30.0000 mg | Freq: Four times a day (QID) | INTRAMUSCULAR | Status: DC
  Administered 2019-09-17 – 2019-09-18 (×2): 30 mg via INTRAVENOUS

## 2019-09-17 MED ORDER — ACETAMINOPHEN 500 MG PO TABS
ORAL_TABLET | ORAL | Status: AC
  Filled 2019-09-17: qty 2

## 2019-09-17 MED ORDER — FENTANYL CITRATE (PF) 100 MCG/2ML IJ SOLN
INTRAMUSCULAR | Status: AC
  Filled 2019-09-17: qty 2

## 2019-09-17 MED ORDER — ENOXAPARIN SODIUM 40 MG/0.4ML ~~LOC~~ SOLN: 40.0000 mg | SUBCUTANEOUS | Status: DC

## 2019-09-17 MED ORDER — PROPOFOL 10 MG/ML IV BOLUS
INTRAVENOUS | Status: AC
  Filled 2019-09-17: qty 20

## 2019-09-17 MED ORDER — DOCUSATE SODIUM 100 MG PO CAPS
ORAL_CAPSULE | ORAL | Status: AC
  Filled 2019-09-17: qty 1

## 2019-09-17 MED ORDER — SODIUM CHLORIDE 0.9 % IV SOLN
INTRAVENOUS | Status: AC
  Filled 2019-09-17: qty 2

## 2019-09-17 MED ORDER — ALUM & MAG HYDROXIDE-SIMETH 200-200-20 MG/5ML PO SUSP: 30.0000 mL | ORAL | Status: DC | PRN

## 2019-09-17 MED ORDER — ONDANSETRON HCL 4 MG PO TABS: 4.0000 mg | ORAL_TABLET | Freq: Four times a day (QID) | ORAL | Status: DC | PRN

## 2019-09-17 MED ORDER — ACETAMINOPHEN 500 MG PO TABS
1000.0000 mg | ORAL_TABLET | ORAL | Status: AC
  Administered 2019-09-17: 1000 mg via ORAL

## 2019-09-17 MED ORDER — ZOLPIDEM TARTRATE 5 MG PO TABS: 5.0000 mg | ORAL_TABLET | Freq: Every evening | ORAL | Status: DC | PRN

## 2019-09-17 MED ORDER — ACETAMINOPHEN 500 MG PO TABS: 1000.0000 mg | ORAL_TABLET | Freq: Once | ORAL | Status: DC

## 2019-09-17 MED ORDER — ONDANSETRON HCL 4 MG/2ML IJ SOLN: 4.0000 mg | Freq: Four times a day (QID) | INTRAMUSCULAR | Status: DC | PRN

## 2019-09-17 MED ORDER — ONDANSETRON HCL 4 MG/2ML IJ SOLN
INTRAMUSCULAR | Status: AC
  Filled 2019-09-17: qty 2

## 2019-09-17 MED ORDER — SODIUM CHLORIDE 0.9 % IV SOLN
2.0000 g | INTRAVENOUS | Status: AC
  Administered 2019-09-17: 2 g via INTRAVENOUS

## 2019-09-17 SURGICAL SUPPLY — 66 items
ADH SKN CLS APL DERMABOND .7 (GAUZE/BANDAGES/DRESSINGS) ×2
APL SRG 38 LTWT LNG FL B (MISCELLANEOUS)
APPLICATOR ARISTA FLEXITIP XL (MISCELLANEOUS) IMPLANT
BLADE SURG 10 STRL SS (BLADE) IMPLANT
CABLE HIGH FREQUENCY MONO STRZ (ELECTRODE) IMPLANT
CANISTER SUCT 3000ML PPV (MISCELLANEOUS) ×4 IMPLANT
CELL SAVER LIPIGURD (MISCELLANEOUS) IMPLANT
COVER BACK TABLE 60X90IN (DRAPES) IMPLANT
COVER MAYO STAND STRL (DRAPES) ×4 IMPLANT
COVER WAND RF STERILE (DRAPES) ×4 IMPLANT
DECANTER SPIKE VIAL GLASS SM (MISCELLANEOUS) ×4 IMPLANT
DERMABOND ADVANCED (GAUZE/BANDAGES/DRESSINGS) ×2
DERMABOND ADVANCED .7 DNX12 (GAUZE/BANDAGES/DRESSINGS) ×2 IMPLANT
DRSG COVADERM PLUS 2X2 (GAUZE/BANDAGES/DRESSINGS) ×12 IMPLANT
DRSG OPSITE POSTOP 3X4 (GAUZE/BANDAGES/DRESSINGS) ×4 IMPLANT
DURAPREP 26ML APPLICATOR (WOUND CARE) ×4 IMPLANT
EXTRT SYSTEM ALEXIS 14CM (MISCELLANEOUS)
EXTRT SYSTEM ALEXIS 17CM (MISCELLANEOUS)
GAUZE 4X4 16PLY RFD (DISPOSABLE) ×4 IMPLANT
GLOVE BIO SURGEON STRL SZ 6.5 (GLOVE) ×6 IMPLANT
GLOVE BIO SURGEONS STRL SZ 6.5 (GLOVE) ×2
GLOVE BIOGEL PI IND STRL 7.0 (GLOVE) ×8 IMPLANT
GLOVE BIOGEL PI INDICATOR 7.0 (GLOVE) ×8
GOWN STRL REUS W/TWL LRG LVL3 (GOWN DISPOSABLE) ×16 IMPLANT
HARMONIC RUM II 2.5CM SILVER (DISPOSABLE)
HARMONIC RUM II 3.0CM SILVER (DISPOSABLE) ×4
HARMONIC RUM II 3.5CM SILVER (DISPOSABLE)
HARMONIC RUM II 4.0CM SILVER (DISPOSABLE)
HEMOSTAT ARISTA ABSORB 3G PWDR (HEMOSTASIS) ×4 IMPLANT
LIGASURE VESSEL 5MM BLUNT TIP (ELECTROSURGICAL) ×4 IMPLANT
NEEDLE INSUFFLATION 120MM (ENDOMECHANICALS) ×4 IMPLANT
PACK LAPAROSCOPY BASIN (CUSTOM PROCEDURE TRAY) ×4 IMPLANT
PACK TRENDGUARD 450 HYBRID PRO (MISCELLANEOUS) ×2 IMPLANT
POUCH LAPAROSCOPIC INSTRUMENT (MISCELLANEOUS) ×4 IMPLANT
PROTECTOR NERVE ULNAR (MISCELLANEOUS) ×8 IMPLANT
RETRACTOR WOUND ALXS 19CM XSML (INSTRUMENTS) IMPLANT
RTRCTR WOUND ALEXIS 19CM XSML (INSTRUMENTS)
SCALPEL HRMNC RUM II 2.5 SILVR (DISPOSABLE) IMPLANT
SCALPEL HRMNC RUM II 3.0 SILVR (DISPOSABLE) ×2 IMPLANT
SCALPEL HRMNC RUM II 3.5 SILVR (DISPOSABLE) IMPLANT
SCALPEL HRMNC RUM II 4.0 SILVR (DISPOSABLE) IMPLANT
SCISSORS LAP 5X35 DISP (ENDOMECHANICALS) IMPLANT
SET IRRIG Y TYPE TUR BLADDER L (SET/KITS/TRAYS/PACK) ×4 IMPLANT
SET SUCTION IRRIG HYDROSURG (IRRIGATION / IRRIGATOR) ×4 IMPLANT
SET TRI-LUMEN FLTR TB AIRSEAL (TUBING) ×4 IMPLANT
SHEARS HARMONIC ACE PLUS 36CM (ENDOMECHANICALS) ×4 IMPLANT
SUT VIC AB 0 CT1 36 (SUTURE) ×4 IMPLANT
SUT VIC AB 4-0 PS2 18 (SUTURE) ×4 IMPLANT
SUT VICRYL 0 UR6 27IN ABS (SUTURE) IMPLANT
SUT VLOC 180 0 9IN  GS21 (SUTURE) ×2
SUT VLOC 180 0 9IN GS21 (SUTURE) ×2 IMPLANT
SYR 50ML LL SCALE MARK (SYRINGE) ×8 IMPLANT
SYSTEM CONTND EXTRCTN KII BLLN (MISCELLANEOUS) IMPLANT
TIP RUMI ORANGE 6.7MMX12CM (TIP) IMPLANT
TIP UTERINE 5.1X6CM LAV DISP (MISCELLANEOUS) IMPLANT
TIP UTERINE 6.7X10CM GRN DISP (MISCELLANEOUS) IMPLANT
TIP UTERINE 6.7X6CM WHT DISP (MISCELLANEOUS) IMPLANT
TIP UTERINE 6.7X8CM BLUE DISP (MISCELLANEOUS) ×4 IMPLANT
TOWEL OR 17X26 10 PK STRL BLUE (TOWEL DISPOSABLE) ×4 IMPLANT
TRAY FOLEY W/BAG SLVR 14FR LF (SET/KITS/TRAYS/PACK) ×4 IMPLANT
TRENDGUARD 450 HYBRID PRO PACK (MISCELLANEOUS) ×4
TROCAR ADV FIXATION 5X100MM (TROCAR) ×4 IMPLANT
TROCAR BLADELESS OPT 5 100 (ENDOMECHANICALS) ×4 IMPLANT
TROCAR PORT AIRSEAL 5X120 (TROCAR) ×4 IMPLANT
TROCAR XCEL NON BLADE 8MM B8LT (ENDOMECHANICALS) ×4 IMPLANT
WARMER LAPAROSCOPE (MISCELLANEOUS) ×4 IMPLANT

## 2019-09-17 NOTE — Interval H&P Note (Signed)
History and Physical Interval Note:  09/17/2019 12:18 PM  Alicia Obrien  has presented today for surgery, with the diagnosis of Abormal uterine bleeding, Pelvic pain, Dyspareunia.  The various methods of treatment have been discussed with the patient and family. After consideration of risks, benefits and other options for treatment, the patient has consented to  Procedure(s) with comments: TOTAL LAPAROSCOPIC HYSTERECTOMY WITH BILATERAL SALPINGECTOMY/POSSIBLE TREATMENT OF ENDOMETRIOSIS (N/A) - possible treatment of endometriosis CYSTOSCOPY (N/A) as a surgical intervention.  The patient's history has been reviewed, patient examined, no change in status, stable for surgery.  I have reviewed the patient's chart and labs.  Questions were answered to the patient's satisfaction.     [name]

## 2019-09-17 NOTE — Transfer of Care (Signed)
Immediate Anesthesia Transfer of Care Note  Patient: Alicia Obrien  Procedure(s) Performed: TOTAL LAPAROSCOPIC HYSTERECTOMY, BILATERAL SALPINGECTOMY (N/A Abdomen) CYSTOSCOPY (N/A Bladder)  Patient Location: PACU  Anesthesia Type:General  Level of Consciousness: awake, drowsy and patient cooperative  Airway & Oxygen Therapy: Patient Spontanous Breathing and Patient connected to nasal cannula oxygen  Post-op Assessment: Report given to RN and Post -op Vital signs reviewed and stable  Post vital signs: Reviewed and stable  Last Vitals:  Vitals Value Taken Time  BP 130/90 09/17/19 1518  Temp    Pulse 72 09/17/19 1521  Resp 12 09/17/19 1521  SpO2 99 % 09/17/19 1521  Vitals shown include unvalidated device data.  Last Pain:  Vitals:   09/17/19 0955  TempSrc: Oral      Patients Stated Pain Goal: 5 (86/57/84 6962)  Complications: No complications documented.

## 2019-09-17 NOTE — Op Note (Signed)
Preoperative Diagnosis: abnormal uterine bleeding, chronic pelvic pain  Postoperative Diagnosis: same  Procedure:  Total Laparoscopic Hysterectomy with bilateral salpingectomies and cystoscopy  Surgeon: Dr Sumner Boast  Assistant: Dr Josefa Half, an MD assistant was necessary for tissue manipulation, retraction and positioning due to the complexity of the case and hospital policies  Anesthesia: General  EBL: 20 cc  Fluids: 1,000 cc LR  Urine output: 956 cc  Complications: none  Indications for surgery: The patient is a 44 year old female, who presented with chronic pelvic pain and abnormal uterine bleeding. Work up included an ultrasound that showed a slightly enlarged uterus with a thickened endometrium. Pelvic exam was concerning for PID and she was started on antibiotics. Cervical cultures were negative, her WBC was normal and her endometrial biopsy was benign without plasma cells.  The patient is aware of the risks and complications involved with the surgery and consent was obtained prior to the procedure.  Findings: EUA: normal sized, mobile uterus, no adnexal masses. Laparoscopy: normal uterus and bilateral adnexa, normal liver edge.   Procedure: The patient was taken to the operating room with an IV in placed, preoperative antibiotics had been administered. She was placed in the dorsal lithotomy position. General anesthesia was administered. She was prepped and draped in the usual sterile fashion for an abdominal, vaginal surgery. A rumi uterine manipulator was placed, using a # 3 cup and a 8 cm extender. A foley catheter was placed.    The umbilicus was everted, injected with 0.25% marcaine and incised with a # 11 blade. 2 towel clips were used to elevated the umbilicus and a veress needle was placed into the abdominal cavity. The abdominal cavity was insufflated with CO2, with normal intraabdominal pressures. After adequate pneumo-insufflation the veress needle was removed and  the 5 mm laparoscope was placed into the abdominal cavity using the opti-view trocar. The patient was placed in trendelenburg and the abdominal pelvic cavity was inspected. 3 more trocars were placed: 1 in each lower quadrant approximately 3 cm medial to and superior to the anterior superior iliac spine and one in the midline approximately 6 cm above the pubic symphysis in the midline. These areas were injected with 0.25% marcaine, incised with a #11 blade and all trocars were inserted with direct visualization with the laparoscope. A # 5 airseal trocar was placed in the RLQ, a 5 mm trocar in the LLQ and a #8 trocar in the midline. The abdominal pelvic cavity was again inspected. A mixture of 30 cc of Robivacaine and 30 cc of NS was place in the pelvic cavity. The ureters were identified bilaterally.  The left tube was elevated from the pelvic sidewall, cauterized and cut with the ligasure device. The mesosalpinx was cauterized and cut with the ligasure device. The tube was separated from the uterus using the ligasure device and removed through the midline trocar. The left uterine ovarian ligament was cauterized and cut with the ligasure device. The left round ligament was cauterized and cut with the ligasure device and the anterior and posterior leafs of the broad ligament were taken down with the ligasure device. The harmonic scalpel was then used to take down the bladder flap and skeltonize the vessels. The left uterine vessels were then clamped, cauterized and ligated with the ligasure device. Hemostasis was excellent. The same procedure was repeated on the right.   Using the rumi manipulator the uterus was pushed up in the pelvic cavity and the harmonic scalpel was used to separate  the cervix from the vagina using the harmonic energy. The uterus was  removed vaginally at this time. An occluder was placed in the vagina to maintain pneumoperitoneum. The vaginal cuff was then closed with a 0 V-lock suture.  Hemostasis was excellent. The abdominal pelvic cavity was irrigated and suctioned dry. Pressure was released and hemostasis remained excellent.   The abdominal cavity was desufflated and the trocars were removed. The skin was closed with subcuticular stiches of 4-0 vicryl and dermabond was placed over the incisions.  The foley catheter was removed and cystoscopy was performed using a 70 degree scope. Both ureters expelled urine, no bladder abnormalities were noted. The bladder was allowed to drain and the cystoscope was removed.   The patient's abdomen and perineum were cleansed and she was taken out of the dorsal lithotomy position. Upon awakening she was extubated and taken to the recovery room in stable condition. The sponge and instrument counts were correct.   CC: Stark Klein, FNP

## 2019-09-17 NOTE — Progress Notes (Signed)
Day of Surgery Procedure(s) (LRB): TOTAL LAPAROSCOPIC HYSTERECTOMY, BILATERAL SALPINGECTOMY (N/A) CYSTOSCOPY (N/A)  Subjective: Patient reports some cramping lower abdominal pain, her back pain is better. She hasn't been out of bed yet. Hasn't eaten or voided.   Objective: I have reviewed patient's vital signs and intake and output.  General: alert, cooperative and no distress Resp: clear to auscultation bilaterally Cardio: S1, S2 normal GI: soft, non-tender; bowel sounds normal; no masses,  no organomegaly Extremities: extremities normal, atraumatic, no cyanosis or edema Incisions: clean, dry and intact without erythema  Assessment: s/p Procedure(s): TOTAL LAPAROSCOPIC HYSTERECTOMY, BILATERAL SALPINGECTOMY (N/A) CYSTOSCOPY (N/A): stable and progressing well  Plan: Advance diet Encourage ambulation Advance to PO medication  LOS: 0 days    [name] 09/17/2019, 5:35 PM

## 2019-09-17 NOTE — Anesthesia Procedure Notes (Signed)
Procedure Name: Intubation Date/Time: 09/17/2019 1:06 PM Performed by: Garrel Ridgel, CRNA Pre-anesthesia Checklist: Patient identified, Emergency Drugs available, Suction available and Patient being monitored Patient Re-evaluated:Patient Re-evaluated prior to induction Oxygen Delivery Method: Circle system utilized Preoxygenation: Pre-oxygenation with 100% oxygen Induction Type: IV induction Ventilation: Mask ventilation without difficulty Laryngoscope Size: Mac and 3 Grade View: Grade I Tube type: Oral Tube size: 7.0 mm Number of attempts: 1 Airway Equipment and Method: Stylet and Oral airway Placement Confirmation: ETT inserted through vocal cords under direct vision,  positive ETCO2 and breath sounds checked- equal and bilateral Secured at: 21 cm Tube secured with: Tape Dental Injury: Teeth and Oropharynx as per pre-operative assessment

## 2019-09-17 NOTE — Anesthesia Preprocedure Evaluation (Signed)
Anesthesia Evaluation  Patient identified by MRN, date of birth, ID band Patient awake    Reviewed: Allergy & Precautions, NPO status , Patient's Chart, lab work & pertinent test results  Airway Mallampati: II  TM Distance: >3 FB Neck ROM: Full    Dental no notable dental hx.    Pulmonary neg pulmonary ROS,    Pulmonary exam normal breath sounds clear to auscultation       Cardiovascular hypertension, Pt. on medications Normal cardiovascular exam Rhythm:Regular Rate:Normal  HLD   Neuro/Psych negative neurological ROS  negative psych ROS   GI/Hepatic negative GI ROS, Neg liver ROS,   Endo/Other  negative endocrine ROSObese BMI 32  Renal/GU negative Renal ROS  negative genitourinary   Musculoskeletal negative musculoskeletal ROS (+)   Abdominal   Peds  Hematology negative hematology ROS (+)   Anesthesia Other Findings   Reproductive/Obstetrics                             Anesthesia Physical Anesthesia Plan  ASA: II  Anesthesia Plan: General   Post-op Pain Management:    Induction: Intravenous  PONV Risk Score and Plan: 3 and Midazolam, Dexamethasone and Ondansetron  Airway Management Planned: Oral ETT  Additional Equipment:   Intra-op Plan:   Post-operative Plan: Extubation in OR  Informed Consent: I have reviewed the patients History and Physical, chart, labs and discussed the procedure including the risks, benefits and alternatives for the proposed anesthesia with the patient or authorized representative who has indicated his/her understanding and acceptance.     Dental advisory given  Plan Discussed with: CRNA  Anesthesia Plan Comments:         Anesthesia Quick Evaluation

## 2019-09-18 ENCOUNTER — Encounter (HOSPITAL_BASED_OUTPATIENT_CLINIC_OR_DEPARTMENT_OTHER): Payer: Self-pay | Admitting: Obstetrics and Gynecology

## 2019-09-18 DIAGNOSIS — N921 Excessive and frequent menstruation with irregular cycle: Secondary | ICD-10-CM | POA: Diagnosis not present

## 2019-09-18 LAB — CBC
HCT: 33.7 % — ABNORMAL LOW (ref 36.0–46.0)
Hemoglobin: 11 g/dL — ABNORMAL LOW (ref 12.0–15.0)
MCH: 27.6 pg (ref 26.0–34.0)
MCHC: 32.6 g/dL (ref 30.0–36.0)
MCV: 84.7 fL (ref 80.0–100.0)
Platelets: 216 10*3/uL (ref 150–400)
RBC: 3.98 MIL/uL (ref 3.87–5.11)
RDW: 13.5 % (ref 11.5–15.5)
WBC: 17 10*3/uL — ABNORMAL HIGH (ref 4.0–10.5)
nRBC: 0 % (ref 0.0–0.2)

## 2019-09-18 MED ORDER — OXYCODONE HCL 5 MG PO TABS: 5.0000 mg | ORAL_TABLET | ORAL | 0 refills | Status: DC | PRN

## 2019-09-18 MED ORDER — ACETAMINOPHEN 500 MG PO TABS
1000.0000 mg | ORAL_TABLET | Freq: Four times a day (QID) | ORAL | 0 refills | Status: DC
Start: 2019-09-18 — End: 2019-09-22

## 2019-09-18 MED ORDER — ACETAMINOPHEN 500 MG PO TABS
ORAL_TABLET | ORAL | Status: AC
  Filled 2019-09-18: qty 2

## 2019-09-18 MED ORDER — KETOROLAC TROMETHAMINE 30 MG/ML IJ SOLN
INTRAMUSCULAR | Status: AC
  Filled 2019-09-18: qty 1

## 2019-09-18 MED ORDER — DOCUSATE SODIUM 100 MG PO CAPS: 100.0000 mg | ORAL_CAPSULE | Freq: Two times a day (BID) | ORAL | 0 refills | Status: DC

## 2019-09-18 MED ORDER — OXYCODONE HCL 5 MG PO TABS
ORAL_TABLET | ORAL | Status: AC
  Filled 2019-09-18: qty 2

## 2019-09-18 MED ORDER — IBUPROFEN 800 MG PO TABS
800.0000 mg | ORAL_TABLET | Freq: Four times a day (QID) | ORAL | 0 refills | Status: DC
Start: 2019-09-18 — End: 2019-11-26

## 2019-09-18 NOTE — Discharge Summary (Signed)
Physician Discharge Summary  Patient ID: Alicia Obrien MRN: [id number] DOB/AGE: 1975-12-26 44 y.o.  Admit date: 09/17/2019 Discharge date: 09/18/2019  Admission Diagnoses: Chronic pelvic pain, abnormal uterine bleeding  Discharge Diagnoses:  Active Problems:   S/P laparoscopic hysterectomy   Status post laparoscopic hysterectomy   Discharged Condition: good  Hospital Course: uncomplicated  Consults: None  Significant Diagnostic Studies: labs:  CBC Latest Ref Rng & Units 09/18/2019 09/17/2019 09/13/2019  WBC 4.0 - 10.5 K/uL 17.0(H) 15.4(H) 6.5  Hemoglobin 12.0 - 15.0 g/dL 11.0(L) 12.9 12.4  Hematocrit 36 - 46 % 33.7(L) 38.3 38.6  Platelets 150 - 400 K/uL 216 232 249     Treatments: surgery: total laparoscopic hysterectomy, bilateral salpingectomies, cystoscopy  Today's Vitals   09/18/19 0025 09/18/19 0230 09/18/19 0500 09/18/19 0650  BP:  122/71  115/74  Pulse:  72  81  Resp:  18  18  Temp:  97.7 F (36.5 C)  97.9 F (36.6 C)  TempSrc:      SpO2:  96%  98%  Weight:      Height:      PainSc: 0-No pain 6  Asleep 5    I/O last 3 completed shifts: In: 2140 [P.O.:240; I.V.:1800; IV Piggyback:100] Out: 3474 [Urine:3550; Blood:20] No intake/output data recorded.  Subjective: the patient is feeling good, pain is better than prior to surgery. Ambulating, tolerating po, voiding, ready for D/C   Discharge Exam: Blood pressure 115/74, pulse 81, temperature 97.9 F (36.6 C), resp. rate 18, height 5\' 9"  (1.753 m), weight 100 kg, last menstrual period 09/09/2019, SpO2 98 %, unknown if currently breastfeeding. General appearance: alert, cooperative and no distress Resp: clear to auscultation bilaterally Cardio: S1, S2 normal GI: soft, non-tender; bowel sounds normal; no masses,  no organomegaly Extremities: extremities normal, atraumatic, no cyanosis or edema Incision/Wound: clean, dry and intact without erythema.  Disposition: Discharge disposition: 01-Home or Self  Care     WBC is slightly elevated, I feel it is just from the surgery. No signs of infection. Surgery was uncomplicated, she is feeling very well, exam and vitals are normal. Patient will call with any concerns. F/U next week, sooner if needed.   Discharge Instructions    Call MD for:   Complete by: As directed    Heavy vaginal bleeding   Call MD for:  difficulty breathing, headache or visual disturbances   Complete by: As directed    Call MD for:  hives   Complete by: As directed    Call MD for:  persistant dizziness or light-headedness   Complete by: As directed    Call MD for:  persistant nausea and vomiting   Complete by: As directed    Call MD for:  redness, tenderness, or signs of infection (pain, swelling, redness, odor or green/yellow discharge around incision site)   Complete by: As directed    Call MD for:  severe uncontrolled pain   Complete by: As directed    Call MD for:  temperature >100.4   Complete by: As directed    Diet - low sodium heart healthy   Complete by: As directed    Driving Restrictions   Complete by: As directed    No driving while taking oxycodone. You must be able to slam on the brakes to drive.   Increase activity slowly   Complete by: As directed    No dressing needed   Complete by: As directed    Sexual Activity Restrictions   Complete by: As  directed    No intercourse x 12 weeks     Allergies as of 09/18/2019      Reactions   Chlorhexidine Gluconate Itching   Latex Rash   AND HIVES , ANAPHYLACTIC REACTION       Medication List    TAKE these medications   acetaminophen 500 MG tablet Commonly known as: TYLENOL Take 2 tablets (1,000 mg total) by mouth every 6 (six) hours. What changed:   how much to take  when to take this  reasons to take this   amLODipine 5 MG tablet Commonly known as: NORVASC Take 1 tablet (5 mg total) by mouth daily.   atorvastatin 20 MG tablet Commonly known as: LIPITOR Take 20 mg by mouth daily.    cetirizine 10 MG tablet Commonly known as: ZYRTEC Take 10 mg by mouth daily.   docusate sodium 100 MG capsule Commonly known as: COLACE Take 1 capsule (100 mg total) by mouth 2 (two) times daily.   hydrochlorothiazide 12.5 MG tablet Commonly known as: HYDRODIURIL Take 1 tablet by mouth daily.   ibuprofen 800 MG tablet Commonly known as: ADVIL Take 1 tablet (800 mg total) by mouth every 6 (six) hours.   oxyCODONE 5 MG immediate release tablet Commonly known as: Oxy IR/ROXICODONE Take 1-2 tablets (5-10 mg total) by mouth every 4 (four) hours as needed for moderate pain.            Discharge Care Instructions  (From admission, onward)         Start     Ordered   09/18/19 0000  No dressing needed        09/18/19 0802           Signed: [name] 09/18/2019, 8:02 AM

## 2019-09-18 NOTE — Discharge Instructions (Signed)
Laparoscopically Assisted Vaginal Hysterectomy, Care After This sheet gives you information about how to care for yourself after your procedure. Your health care provider may also give you more specific instructions. If you have problems or questions, contact your health care provider. What can I expect after the procedure? After the procedure, it is common to have:  Soreness and numbness in your incision areas.  Abdominal pain. You will be given pain medicine to control it.  Vaginal bleeding and discharge. You will need to use a sanitary napkin after this procedure.  Sore throat from the breathing tube that was inserted during surgery. Follow these instructions at home: Medicines  Take over-the-counter and prescription medicines only as told by your health care provider.  Do not take aspirin or ibuprofen. These medicines can cause bleeding.  Do not drive or use heavy machinery while taking prescription pain medicine.  Do not drive for 24 hours if you were given a medicine to help you relax (sedative) during the procedure. Incision care   Follow instructions from your health care provider about how to take care of your incisions. Make sure you: ? Wash your hands with soap and water before you change your bandage (dressing). If soap and water are not available, use hand sanitizer. ? Change your dressing as told by your health care provider. ? Leave stitches (sutures), skin glue, or adhesive strips in place. These skin closures may need to stay in place for 2 weeks or longer. If adhesive strip edges start to loosen and curl up, you may trim the loose edges. Do not remove adhesive strips completely unless your health care provider tells you to do that.  Check your incision area every day for signs of infection. Check for: ? Redness, swelling, or pain. ? Fluid or blood. ? Warmth. ? Pus or a bad smell. Activity  Get regular exercise as told by your health care provider. You may be  told to take short walks every day and go farther each time.  Return to your normal activities as told by your health care provider. Ask your health care provider what activities are safe for you.  Do not douche, use tampons, or have sexual intercourse for at least 6 weeks, or until your health care provider gives you permission.  Do not lift anything that is heavier than 10 lb (4.5 kg), or the limit that your health care provider tells you, until he or she says that it is safe. General instructions  Do not take baths, swim, or use a hot tub until your health care provider approves. Take showers instead of baths.  Do not drive for 24 hours if you received a sedative.  Do not drive or operate heavy machinery while taking prescription pain medicine.  To prevent or treat constipation while you are taking prescription pain medicine, your health care provider may recommend that you: ? Drink enough fluid to keep your urine clear or pale yellow. ? Take over-the-counter or prescription medicines. ? Eat foods that are high in fiber, such as fresh fruits and vegetables, whole grains, and beans. ? Limit foods that are high in fat and processed sugars, such as fried and sweet foods.  Keep all follow-up visits as told by your health care provider. This is important. Contact a health care provider if:  You have signs of infection, such as: ? Redness, swelling, or pain around your incision sites. ? Fluid or blood coming from an incision. ? An incision that feels warm to the   touch. ? Pus or a bad smell coming from an incision.  Your incision breaks open.  Your pain medicine is not helping.  You feel dizzy or light-headed.  You have pain or bleeding when you urinate.  You have persistent nausea and vomiting.  You have blood, pus, or a bad-smelling discharge from your vagina. Get help right away if:  You have a fever.  You have severe abdominal pain.  You have chest pain.  You have  shortness of breath.  You faint.  You have pain, swelling, or redness in your leg.  You have heavy bleeding from your vagina. Summary  After the procedure, it is common to have abdominal pain and vaginal bleeding.  You should not drive or lift heavy objects until your health care provider says that it is safe.  Contact your health care provider if you have any symptoms of infection, excessive vaginal bleeding, nausea, vomiting, or shortness of breath. This information is not intended to replace advice given to you by your health care provider. Make sure you discuss any questions you have with your health care provider. Document Revised: 12/09/2016 Document Reviewed: 02/23/2016 Elsevier Patient Education  2020 [employer]   Post Anesthesia Home Care Instructions  Activity: Get plenty of rest for the remainder of the day. A responsible individual must stay with you for 24 hours following the procedure.  For the next 24 hours, DO NOT: -Drive a car -Operate machinery -Drink alcoholic beverages -Take any medication unless instructed by your physician -Make any legal decisions or sign important papers.  Meals: Start with liquid foods such as gelatin or soup. Progress to regular foods as tolerated. Avoid greasy, spicy, heavy foods. If nausea and/or vomiting occur, drink only clear liquids until the nausea and/or vomiting subsides. Call your physician if vomiting continues.  Special Instructions/Symptoms: Your throat may feel dry or sore from the anesthesia or the breathing tube placed in your throat during surgery. If this causes discomfort, gargle with warm salt water. The discomfort should disappear within 24 hours.  If you had a scopolamine patch placed behind your ear for the management of post- operative nausea and/or vomiting:  1. The medication in the patch is effective for 72 hours, after which it should be removed.  Wrap patch in a tissue and discard in the trash. Wash  hands thoroughly with soap and water. 2. You may remove the patch earlier than 72 hours if you experience unpleasant side effects which may include dry mouth, dizziness or visual disturbances. 3. Avoid touching the patch. Wash your hands with soap and water after contact with the patch.     

## 2019-09-18 NOTE — Anesthesia Postprocedure Evaluation (Signed)
Anesthesia Post Note  Patient: Alicia Obrien  Procedure(s) Performed: TOTAL LAPAROSCOPIC HYSTERECTOMY, BILATERAL SALPINGECTOMY (N/A Abdomen) CYSTOSCOPY (N/A Bladder)     Patient location during evaluation: PACU Anesthesia Type: General Level of consciousness: awake and alert Pain management: pain level controlled Vital Signs Assessment: post-procedure vital signs reviewed and stable Respiratory status: spontaneous breathing, nonlabored ventilation, respiratory function stable and patient connected to nasal cannula oxygen Cardiovascular status: blood pressure returned to baseline and stable Postop Assessment: no apparent nausea or vomiting Anesthetic complications: no   No complications documented.  Last Vitals:  Vitals:   09/18/19 0650 09/18/19 0825  BP: 115/74 (!) 116/55  Pulse: 81 77  Resp: 18 18  Temp: 36.6 C (!) 36.2 C  SpO2: 98% 98%    Last Pain:  Vitals:   09/18/19 0825  TempSrc:   PainSc: 3                  Donnabelle Blanchard L Correll Denbow

## 2019-09-19 LAB — SURGICAL PATHOLOGY

## 2019-09-21 ENCOUNTER — Emergency Department (HOSPITAL_COMMUNITY)
Admission: EM | Admit: 2019-09-21 | Discharge: 2019-09-21 | Disposition: A | Discharge status: Home / Self Care | Payer: BC Managed Care – PPO | Attending: Emergency Medicine | Admitting: Emergency Medicine

## 2019-09-21 ENCOUNTER — Emergency Department (HOSPITAL_COMMUNITY): Payer: BC Managed Care – PPO

## 2019-09-21 ENCOUNTER — Emergency Department (HOSPITAL_BASED_OUTPATIENT_CLINIC_OR_DEPARTMENT_OTHER)
Admission: EM | Admit: 2019-09-21 | Discharge: 2019-09-22 | Disposition: A | Discharge status: Home / Self Care | Payer: BC Managed Care – PPO | Source: Home / Self Care | Attending: Emergency Medicine | Admitting: Emergency Medicine

## 2019-09-21 ENCOUNTER — Encounter (HOSPITAL_COMMUNITY): Payer: Self-pay | Admitting: Emergency Medicine

## 2019-09-21 ENCOUNTER — Other Ambulatory Visit: Payer: Self-pay

## 2019-09-21 ENCOUNTER — Encounter (HOSPITAL_BASED_OUTPATIENT_CLINIC_OR_DEPARTMENT_OTHER): Payer: Self-pay | Admitting: Emergency Medicine

## 2019-09-21 ENCOUNTER — Encounter: Payer: Self-pay | Admitting: Obstetrics and Gynecology

## 2019-09-21 DIAGNOSIS — M79605 Pain in left leg: Secondary | ICD-10-CM | POA: Insufficient documentation

## 2019-09-21 DIAGNOSIS — M62838 Other muscle spasm: Secondary | ICD-10-CM | POA: Insufficient documentation

## 2019-09-21 DIAGNOSIS — Z79899 Other long term (current) drug therapy: Secondary | ICD-10-CM | POA: Insufficient documentation

## 2019-09-21 DIAGNOSIS — I10 Essential (primary) hypertension: Secondary | ICD-10-CM | POA: Insufficient documentation

## 2019-09-21 DIAGNOSIS — M25552 Pain in left hip: Secondary | ICD-10-CM | POA: Insufficient documentation

## 2019-09-21 DIAGNOSIS — Z91040 Latex allergy status: Secondary | ICD-10-CM | POA: Insufficient documentation

## 2019-09-21 MED ORDER — IBUPROFEN 200 MG PO TABS
400.0000 mg | ORAL_TABLET | Freq: Once | ORAL | Status: AC | PRN
  Administered 2019-09-21: 400 mg via ORAL
  Filled 2019-09-21: qty 2

## 2019-09-21 MED ORDER — OXYCODONE-ACETAMINOPHEN 5-325 MG PO TABS
1.0000 | ORAL_TABLET | ORAL | Status: DC | PRN
  Administered 2019-09-21: 1 via ORAL
  Filled 2019-09-21: qty 1

## 2019-09-21 NOTE — ED Triage Notes (Signed)
Patient here from home reporting left hip and leg pain. 4 days post op hysterectomy. Pins and needle.

## 2019-09-21 NOTE — ED Notes (Signed)
Patient is stating that her left leg feels numb and is starting to cramp.

## 2019-09-21 NOTE — ED Triage Notes (Signed)
Pt reports left leg cramping since Tuesday. States she had a hysterectomy at that time and was told her potassium was low. States progressively worse since Tuesday. WEnt to [facility] already today and LWBS. Pain radiates up into abd and chest, ekg completed in triage.

## 2019-09-22 LAB — CBC WITH DIFFERENTIAL/PLATELET
Abs Immature Granulocytes: 0.02 10*3/uL (ref 0.00–0.07)
Basophils Absolute: 0.1 10*3/uL (ref 0.0–0.1)
Basophils Relative: 1 %
Eosinophils Absolute: 0.4 10*3/uL (ref 0.0–0.5)
Eosinophils Relative: 4 %
HCT: 36.2 % (ref 36.0–46.0)
Hemoglobin: 11.9 g/dL — ABNORMAL LOW (ref 12.0–15.0)
Immature Granulocytes: 0 %
Lymphocytes Relative: 38 %
Lymphs Abs: 4.1 10*3/uL — ABNORMAL HIGH (ref 0.7–4.0)
MCH: 27.8 pg (ref 26.0–34.0)
MCHC: 32.9 g/dL (ref 30.0–36.0)
MCV: 84.6 fL (ref 80.0–100.0)
Monocytes Absolute: 0.7 10*3/uL (ref 0.1–1.0)
Monocytes Relative: 7 %
Neutro Abs: 5.5 10*3/uL (ref 1.7–7.7)
Neutrophils Relative %: 50 %
Platelets: 262 10*3/uL (ref 150–400)
RBC: 4.28 MIL/uL (ref 3.87–5.11)
RDW: 13.3 % (ref 11.5–15.5)
WBC: 10.8 10*3/uL — ABNORMAL HIGH (ref 4.0–10.5)
nRBC: 0 % (ref 0.0–0.2)

## 2019-09-22 LAB — URINALYSIS, ROUTINE W REFLEX MICROSCOPIC
Bilirubin Urine: NEGATIVE
Glucose, UA: NEGATIVE mg/dL
Hgb urine dipstick: NEGATIVE
Ketones, ur: NEGATIVE mg/dL
Leukocytes,Ua: NEGATIVE
Nitrite: NEGATIVE
Protein, ur: NEGATIVE mg/dL
Specific Gravity, Urine: 1.025 (ref 1.005–1.030)
pH: 6 (ref 5.0–8.0)

## 2019-09-22 LAB — BASIC METABOLIC PANEL
Anion gap: 11 (ref 5–15)
BUN: 14 mg/dL (ref 6–20)
CO2: 26 mmol/L (ref 22–32)
Calcium: 9.1 mg/dL (ref 8.9–10.3)
Chloride: 99 mmol/L (ref 98–111)
Creatinine, Ser: 0.76 mg/dL (ref 0.44–1.00)
GFR calc Af Amer: >60 mL/min (ref >60)
GFR calc non Af Amer: >60 mL/min (ref >60)
Glucose, Bld: 129 mg/dL — ABNORMAL HIGH (ref 70–99)
Potassium: 3.9 mmol/L (ref 3.5–5.1)
Sodium: 136 mmol/L (ref 135–145)

## 2019-09-22 MED ORDER — DIAZEPAM 5 MG/ML IJ SOLN
5.0000 mg | Freq: Once | INTRAMUSCULAR | Status: AC
  Administered 2019-09-22: 5 mg via INTRAVENOUS
  Filled 2019-09-22: qty 2

## 2019-09-22 MED ORDER — DIAZEPAM 5 MG PO TABS: 5.0000 mg | ORAL_TABLET | Freq: Three times a day (TID) | ORAL | 0 refills | Status: DC | PRN

## 2019-09-22 MED ORDER — KETOROLAC TROMETHAMINE 15 MG/ML IJ SOLN
15.0000 mg | Freq: Once | INTRAMUSCULAR | Status: AC
  Administered 2019-09-22: 15 mg via INTRAVENOUS
  Filled 2019-09-22: qty 1

## 2019-09-22 NOTE — ED Provider Notes (Signed)
[facility] [facility] Provider Note: Georgena Spurling, MD, FACEP  CSN: [id number] MRN: [id number] ARRIVAL: 09/21/19 at 2243 ROOM: [place]  Leg Pain   HISTORY OF PRESENT ILLNESS  09/22/19 12:06 AM Alicia Obrien is a 44 y.o. female who had a hysterectomy on 09/17/2019.  She was told that she had low potassium at that time (3.2).  She is having left leg pain since the surgery which she describes as cramping or "charley horse".  She is actually seen in the muscles of her left leg fasciculate.  It has progressively worsened and is now a 10 out of 10, worse with movement or palpation of the muscles.  It radiates up into her abdomen and chest.  She also has pain in her left buttock which she states feels like sciatica.   Past Medical History:  Diagnosis Date   Elevated cholesterol    Family history of breast cancer    Family history of prostate cancer    Heart murmur    Hypertension    Vertigo     Past Surgical History:  Procedure Laterality Date   CYSTOSCOPY N/A 09/17/2019   Procedure: CYSTOSCOPY;  Surgeon: [name], MD;  Location: [facility];  Service: Gynecology;  Laterality: N/A;   NO PAST SURGERIES     TOTAL LAPAROSCOPIC HYSTERECTOMY WITH SALPINGECTOMY N/A 09/17/2019   Procedure: TOTAL LAPAROSCOPIC HYSTERECTOMY, BILATERAL SALPINGECTOMY;  Surgeon: [name], MD;  Location: [facility];  Service: Gynecology;  Laterality: N/A;    Family History  Problem Relation Age of Onset   Diabetes Mother    Cancer Maternal Uncle        bone cancer    Heart disease Maternal Uncle    Dementia Maternal Grandmother    Breast cancer Paternal Grandmother 24       d. in her 43s   Breast cancer Cousin 76       mother's maternal first cousin   Breast cancer Paternal Aunt        dx early 7s   Breast cancer Maternal Great-grandmother    Heart attack Father    Prostate cancer Paternal Uncle 13       dx  2nd time in his 76s   Prostate cancer Maternal Uncle 56   Breast cancer Other        PGMs sister dx in her 50s   Other Paternal Aunt        has breast lumps, watching her closely    Social History   Tobacco Use   Smoking status: Never Smoker   Smokeless tobacco: Never Used  Vaping Use   Vaping Use: Never used  Substance Use Topics   Alcohol use: No   Drug use: No    Prior to Admission medications   Medication Sig Start Date End Date Taking? Authorizing Provider  amLODipine (NORVASC) 5 MG tablet Take 1 tablet (5 mg total) by mouth daily. 04/25/17   Robyn Haber, MD  atorvastatin (LIPITOR) 20 MG tablet Take 20 mg by mouth daily. 03/30/19   [provider]  cetirizine (ZYRTEC) 10 MG tablet Take 10 mg by mouth daily.  03/30/19   [provider]  diazepam (VALIUM) 5 MG tablet Take 1 tablet (5 mg total) by mouth every 8 (eight) hours as needed for muscle spasms (spasms). 09/22/19   Brecken Walth, MD  docusate sodium (COLACE) 100 MG capsule Take 1 capsule (100 mg total) by mouth 2 (two) times  daily. 09/18/19   [name], MD  hydrochlorothiazide (HYDRODIURIL) 12.5 MG tablet Take 1 tablet by mouth daily. 01/31/19   [provider]  ibuprofen (ADVIL) 800 MG tablet Take 1 tablet (800 mg total) by mouth every 6 (six) hours. 09/18/19   [name], MD  oxyCODONE (OXY IR/ROXICODONE) 5 MG immediate release tablet Take 1-2 tablets (5-10 mg total) by mouth every 4 (four) hours as needed for moderate pain. 09/18/19   [name], MD    Allergies Chlorhexidine gluconate and Latex   REVIEW OF SYSTEMS  Negative except as noted here or in the History of Present Illness.   PHYSICAL EXAMINATION  Initial Vital Signs Blood pressure (!) 152/109, pulse (!) 109, temperature 99.6 F (37.6 C), resp. rate (!) 22, weight 98.9 kg, last menstrual period 09/09/2019, SpO2 100 %, unknown if currently breastfeeding.  Examination General: Well-developed,  well-nourished female in no acute distress; appearance consistent with age of record HENT: normocephalic; atraumatic Eyes: pupils equal, round and reactive to light; extraocular muscles intact Neck: supple Heart: regular rate and rhythm Lungs: clear to auscultation bilaterally Abdomen: soft; nondistended; mild diffuse tenderness; well-healing laparoscopy incisions; bowel sounds present Extremities: No deformity; pulses normal; tenderness of left sciatic notch; tenderness of musculature of left leg without obvious spasm palpated or fasciculation seen Neurologic: Awake, alert and oriented; motor function intact in all extremities and symmetric; no facial droop Skin: Warm and dry Psychiatric: Normal mood and affect   RESULTS  Summary of this visit's results, reviewed and interpreted by myself:  EKG Interpretation:  Date & Time: 09/21/2019 10:50 PM  Rate: 110  Rhythm: sinus tachycardia  QRS Axis: normal  Intervals: normal  ST/T Wave abnormalities: nonspecific T wave changes  Conduction Disutrbances:none  Narrative Interpretation:   Old EKG Reviewed: none available  Laboratory Studies: Results for orders placed or performed during the hospital encounter of 09/21/19 (from the past 24 hour(s))  CBC with Differential/Platelet     Status: Abnormal   Collection Time: 09/22/19 12:38 AM  Result Value Ref Range   WBC 10.8 (H) 4.0 - 10.5 K/uL   RBC 4.28 3.87 - 5.11 MIL/uL   Hemoglobin 11.9 (L) 12.0 - 15.0 g/dL   HCT 36.2 36 - 46 %   MCV 84.6 80.0 - 100.0 fL   MCH 27.8 26.0 - 34.0 pg   MCHC 32.9 30.0 - 36.0 g/dL   RDW 13.3 11.5 - 15.5 %   Platelets 262 150 - 400 K/uL   nRBC 0.0 0.0 - 0.2 %   Neutrophils Relative % 50 %   Neutro Abs 5.5 1.7 - 7.7 K/uL   Lymphocytes Relative 38 %   Lymphs Abs 4.1 (H) 0.7 - 4.0 K/uL   Monocytes Relative 7 %   Monocytes Absolute 0.7 0 - 1 K/uL   Eosinophils Relative 4 %   Eosinophils Absolute 0.4 0 - 0 K/uL   Basophils Relative 1 %   Basophils  Absolute 0.1 0 - 0 K/uL   Immature Granulocytes 0 %   Abs Immature Granulocytes 0.02 0.00 - 0.07 K/uL  Basic metabolic panel     Status: Abnormal   Collection Time: 09/22/19 12:38 AM  Result Value Ref Range   Sodium 136 135 - 145 mmol/L   Potassium 3.9 3.5 - 5.1 mmol/L   Chloride 99 98 - 111 mmol/L   CO2 26 22 - 32 mmol/L   Glucose, Bld 129 (H) 70 - 99 mg/dL   BUN 14 6 - 20 mg/dL  Creatinine, Ser 0.76 0.44 - 1.00 mg/dL   Calcium 9.1 8.9 - 10.3 mg/dL   GFR calc non Af Amer >60 >60 mL/min   GFR calc Af Amer >60 >60 mL/min   Anion gap 11 5 - 15  Urinalysis, Routine w reflex microscopic     Status: Abnormal   Collection Time: 09/22/19 12:38 AM  Result Value Ref Range   Color, Urine YELLOW YELLOW   APPearance HAZY (A) CLEAR   Specific Gravity, Urine 1.025 1.005 - 1.030   pH 6.0 5.0 - 8.0   Glucose, UA NEGATIVE NEGATIVE mg/dL   Hgb urine dipstick NEGATIVE NEGATIVE   Bilirubin Urine NEGATIVE NEGATIVE   Ketones, ur NEGATIVE NEGATIVE mg/dL   Protein, ur NEGATIVE NEGATIVE mg/dL   Nitrite NEGATIVE NEGATIVE   Leukocytes,Ua NEGATIVE NEGATIVE   Imaging Studies: DG Hip Unilat With Pelvis 2-3 Views Left  Result Date: 09/21/2019 CLINICAL DATA:  Pain, LEFT hip and leg pain postop for hysterectomy EXAM: DG HIP (WITH OR WITHOUT PELVIS) 2-3V LEFT COMPARISON:  None FINDINGS: No sign of fracture of the bony pelvis. Mild hip degenerative changes bilaterally. No sign of fracture or dislocation about the LEFT hip. 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 No sign of acute fracture or dislocation. Electronically Signed   By: Zetta Bills M.D.   On: 09/21/2019 10:01    ED COURSE and MDM  Nursing notes, initial and subsequent vitals signs, including pulse oximetry, reviewed and interpreted by myself.  Vitals:   09/21/19 2256 09/21/19 2259 09/22/19 0049  BP: (!) 152/109  129/85  Pulse: (!) 109  82  Resp: (!) 22  (!) 22  Temp: 99.6 F (37.6 C)    SpO2: 100%  99%  Weight:   98.9 kg    Medications  diazepam (VALIUM) injection 5 mg (5 mg Intravenous Given 09/22/19 0119)  ketorolac (TORADOL) 15 MG/ML injection 15 mg (15 mg Intravenous Given 09/22/19 0119)    2:10 AM Significant relief with IV Valium 5 mg.  She states she is having some residual soreness but no more muscle spasms.  The cause of her muscle spasms is unclear; her potassium is normal.  PROCEDURES  Procedures   ED DIAGNOSES     ICD-10-CM   1. Muscle spasm of left lower extremity  I96.789        Shanon Rosser, MD 09/22/19 [phone]

## 2019-09-23 ENCOUNTER — Telehealth: Payer: Self-pay | Admitting: *Deleted

## 2019-09-23 ENCOUNTER — Telehealth: Payer: Self-pay | Admitting: Obstetrics and Gynecology

## 2019-09-23 ENCOUNTER — Ambulatory Visit: Payer: Self-pay | Admitting: Obstetrics and Gynecology

## 2019-09-23 NOTE — Telephone Encounter (Signed)
Patient has not arrived at office.  Call to patient for update.  Patient states her PCP has scheduled her for [place] of left lower extremity today at 2pm. Patient declines OV today with Dr. Talbert Nan, will see her on 09/24/19 for her post-op visit. Patient thankful for call.  Routing to Dr. Rosann Auerbach.

## 2019-09-23 NOTE — Telephone Encounter (Signed)
Patient had surgery last week and in now having severe leg pain.

## 2019-09-23 NOTE — Telephone Encounter (Signed)
Patient was to be seen today at 2pm for [place] of left lower extremity ordered by PCP. (see previous telephone encounter dated 09/23/19).  Call to patient, no answer. Left detailed message, ok per dpr. Advised calling to f/u and get update to confirm she completed ultrasound and/or seen by PCP.  Advised our office phones go off at [place] and return on at [place] on 9/14. Will plan to see you for post op visit on 9/14.  Routing to Dr. Rosann Auerbach.

## 2019-09-23 NOTE — Telephone Encounter (Signed)
Call transferred from front office. Spoke with patient, patient crying. S/p TLH 09/17/19. Reports pain in left extremity that started on 09/18/19, pain 10/10. States she is unable to walk or lay down. Denies redness, swelling, SHOB, weakness. She went to ER on 9/11 and left before evaluation was completed, was later seen at [facility]. She is taking Vicodin prn with no relief. States she contacted on call provider over the weekend, no return call. States she called her PCP at [facility] on 9/12 and was advised to f/u with GYN.  Patient placed on hold to review with provider.   Reviewed with Dr. Talbert Nan.  Reviewed option to see PCP or return to ER for further evaluation and management of pain, patient declines. Advised can schedule OV with Dr. Talbert Nan, advised patient she will likely need a [place] of lower extremity to r/o DVT, this is not done in our office, but can be scheduled while in office, any further management of pain or additional evaluation would need to be with PCP/ER.   Patient is requesting to see Dr. Talbert Nan. Instructed patient to come into office now to be worked into schedule, we will work on scheduling [place] while in office, if needed. Patient verbalizes understanding and is agreeable.   Routing to provider for final review. Patient is agreeable to disposition. Will close encounter.

## 2019-09-23 NOTE — Telephone Encounter (Signed)
OV scheduled. See 09/23/19 telephone encounter.   Encounter closed.

## 2019-09-24 ENCOUNTER — Other Ambulatory Visit: Payer: Self-pay

## 2019-09-24 ENCOUNTER — Encounter: Payer: Self-pay | Admitting: Obstetrics and Gynecology

## 2019-09-24 ENCOUNTER — Ambulatory Visit (INDEPENDENT_AMBULATORY_CARE_PROVIDER_SITE_OTHER): Payer: BC Managed Care – PPO | Admitting: Obstetrics and Gynecology

## 2019-09-24 VITALS — BP 120/74 | HR 103 | Ht 69.0 in | Wt 221.0 lb

## 2019-09-24 DIAGNOSIS — Z90710 Acquired absence of both cervix and uterus: Secondary | ICD-10-CM

## 2019-09-24 DIAGNOSIS — M62838 Other muscle spasm: Secondary | ICD-10-CM

## 2019-09-24 MED ORDER — CYCLOBENZAPRINE HCL 5 MG PO TABS: 5.0000 mg | ORAL_TABLET | Freq: Three times a day (TID) | ORAL | 0 refills | Status: DC | PRN

## 2019-09-24 NOTE — Progress Notes (Signed)
GYNECOLOGY  VISIT   HPI: 44 y.o.   Single Unavailable Not Hispanic or Latino  female   [phone] with Patient's last menstrual period was 09/09/2019.   here for 1 week post op s/p total laparoscopic hysterectomy. Benign pathology.  Patient is having left leg muscle spasms.    She heard a little pop in her left leg when she was walking and started having pain. By Saturday her pain was severe, she was seen in the ER, was given Toradol and narcotics. She could see it and feel it spasm. Negative imaging for blood clots. Pain radiates from her upper posterior thigh to her foot. She was having sciatic pain prior to the surgery.   No abdominal pain. Normal bowel and bladder function. She was backed up initially, had a big BM on Sunday night. The pelvic pain she was having prior to surgery has resolved.   GYNECOLOGIC HISTORY: Patient's last menstrual period was 09/09/2019. Contraception:none  Menopausal hormone therapy: none         OB History    Gravida  5   Para  3   Term  3   Preterm      AB  2   Living  3     SAB  2   TAB      Ectopic      Multiple      Live Births                 Patient Active Problem List   Diagnosis Date Noted  . S/P laparoscopic hysterectomy 09/17/2019  . Status post laparoscopic hysterectomy 09/17/2019  . Genetic testing 08/12/2019  . Increased risk of breast cancer 08/06/2019  . Family history of breast cancer   . Family history of prostate cancer   . Vertigo 03/27/2015  . Obesity 03/27/2015  . Essential hypertension 01/28/2015  . History of ovarian cyst 01/28/2015  . Hyperlipidemia 01/28/2015    Past Medical History:  Diagnosis Date  . Elevated cholesterol   . Family history of breast cancer   . Family history of prostate cancer   . Heart murmur   . Hypertension   . Vertigo     Past Surgical History:  Procedure Laterality Date  . CYSTOSCOPY N/A 09/17/2019   Procedure: CYSTOSCOPY;  Surgeon: [name], MD;  Location:  [facility];  Service: Gynecology;  Laterality: N/A;  . NO PAST SURGERIES    . TOTAL LAPAROSCOPIC HYSTERECTOMY WITH SALPINGECTOMY N/A 09/17/2019   Procedure: TOTAL LAPAROSCOPIC HYSTERECTOMY, BILATERAL SALPINGECTOMY;  Surgeon: [name], MD;  Location: [facility];  Service: Gynecology;  Laterality: N/A;    Current Outpatient Medications  Medication Sig Dispense Refill  . amLODipine (NORVASC) 5 MG tablet Take 1 tablet (5 mg total) by mouth daily. 90 tablet 3  . atorvastatin (LIPITOR) 20 MG tablet Take 20 mg by mouth daily.    . cetirizine (ZYRTEC) 10 MG tablet Take 10 mg by mouth daily.     . diazepam (VALIUM) 5 MG tablet Take 1 tablet (5 mg total) by mouth every 8 (eight) hours as needed for muscle spasms (spasms). 12 tablet 0  . docusate sodium (COLACE) 100 MG capsule Take 1 capsule (100 mg total) by mouth 2 (two) times daily. 30 capsule 0  . hydrochlorothiazide (HYDRODIURIL) 12.5 MG tablet Take 1 tablet by mouth daily.    Marland Kitchen ibuprofen (ADVIL) 800 MG tablet Take 1 tablet (800 mg total) by mouth every 6 (six) hours. [place]  tablet 0  . oxyCODONE (OXY IR/ROXICODONE) 5 MG immediate release tablet Take 1-2 tablets (5-10 mg total) by mouth every 4 (four) hours as needed for moderate pain. 30 tablet 0   No current facility-administered medications for this visit.     ALLERGIES: Chlorhexidine gluconate and Latex  Family History  Problem Relation Age of Onset  . Diabetes Mother   . Cancer Maternal Uncle        bone cancer   . Heart disease Maternal Uncle   . Dementia Maternal Grandmother   . Breast cancer Paternal Grandmother 53       d. in her 14s  . Breast cancer Cousin 34       mother's maternal first cousin  . Breast cancer Paternal Aunt        dx early 43s  . Breast cancer Maternal Great-grandmother   . Heart attack Father   . Prostate cancer Paternal Uncle 76       dx 2nd time in his 68s  . Prostate cancer Maternal Uncle 58  . Breast cancer  Other        PGMs sister dx in her 2s  . Other Paternal Aunt        has breast lumps, watching her closely    Social History   Socioeconomic History  . Marital status: Single    Spouse name: Not on file  . Number of children: 3  . Years of education: 32  . Highest education level: Not on file  Occupational History  . Not on file  Tobacco Use  . Smoking status: Never Smoker  . Smokeless tobacco: Never Used  Vaping Use  . Vaping Use: Never used  Substance and Sexual Activity  . Alcohol use: No  . Drug use: No  . Sexual activity: Yes  Other Topics Concern  . Not on file  Social History Narrative  . Not on file   Social Determinants of Health   Financial Resource Strain:   . Difficulty of Paying Living Expenses: Not on file  Food Insecurity:   . Worried About Charity fundraiser in the Last Year: Not on file  . Ran Out of Food in the Last Year: Not on file  Transportation Needs:   . Lack of Transportation (Medical): Not on file  . Lack of Transportation (Non-Medical): Not on file  Physical Activity:   . Days of Exercise per Week: Not on file  . Minutes of Exercise per Session: Not on file  Stress:   . Feeling of Stress : Not on file  Social Connections:   . Frequency of Communication with Friends and Family: Not on file  . Frequency of Social Gatherings with Friends and Family: Not on file  . Attends Religious Services: Not on file  . Active Member of Clubs or Organizations: Not on file  . Attends Archivist Meetings: Not on file  . Marital Status: Not on file  Intimate Partner Violence:   . Fear of Current or Ex-Partner: Not on file  . Emotionally Abused: Not on file  . Physically Abused: Not on file  . Sexually Abused: Not on file    Review of Systems  Musculoskeletal:       Left leg pain  All other systems reviewed and are negative.   PHYSICAL EXAMINATION:    BP 120/74   Pulse (!) 103   Ht 5\' 9"  (1.753 m)   Wt 221 lb (100.2 kg)   LMP  09/09/2019  SpO2 98%   BMI 32.64 kg/m     General appearance: alert, cooperative and appears stated age, limping when she walks Abdomen: soft, non-tender; non distended, no masses,  no organomegaly Incisions: clean, dry and intact without erythema Left lower extremity: tender to palpation of her calf, negative homans sign. No swelling   ASSESSMENT 1 week s/p TLH/BS, doing well from a surgical standpoint.  Severe muscle spasms in her left leg, negative dopplers    PLAN F/U in 3 weeks Flexeril for muscle spams Information on muscle spasms given   An After Visit Summary was printed and given to the patient.

## 2019-09-24 NOTE — Telephone Encounter (Signed)
Patient was seen in office today, 09/24/19. See OV notes.   Encounter closed.

## 2019-09-24 NOTE — Patient Instructions (Signed)
Muscle Cramps and Spasms Muscle cramps and spasms occur when a muscle or muscles tighten and you have no control over this tightening (involuntary muscle contraction). They are a common problem and can develop in any muscle. The most common place is in the calf muscles of the leg. Muscle cramps and muscle spasms are both involuntary muscle contractions, but there are some differences between the two:  Muscle cramps are painful. They come and go and may last for a few seconds or up to 15 minutes. Muscle cramps are often more forceful and last longer than muscle spasms.  Muscle spasms may or may not be painful. They may also last just a few seconds or much longer. Certain medical conditions, such as diabetes or Parkinson's disease, can make it more likely to develop cramps or spasms. However, cramps or spasms are usually not caused by a serious underlying problem. Common causes include:  Doing more physical work or exercise than your body is ready for (overexertion).  Overuse from repeating certain movements too many times.  Remaining in a certain position for a long period of time.  Improper preparation, form, or technique while playing a sport or doing an activity.  Dehydration.  Injury.  Side effects of some medicines.  Abnormally low levels of the salts and minerals in your blood (electrolytes), especially potassium and calcium. This could happen if you are taking water pills (diuretics) or if you are pregnant. In many cases, the cause of muscle cramps or spasms is not known. Follow these instructions at home: Managing pain and stiffness      Try massaging, stretching, and relaxing the affected muscle. Do this for several minutes at a time.  If directed, apply heat to tight or tense muscles as often as told by your health care provider. Use the heat source that your health care provider recommends, such as a moist heat pack or a heating pad. ? Place a towel between your skin and  the heat source. ? Leave the heat on for 20-30 minutes. ? Remove the heat if your skin turns bright red. This is especially important if you are unable to feel pain, heat, or cold. You may have a greater risk of getting burned.  If directed, put ice on the affected area. This may help if you are sore or have pain after a cramp or spasm. ? Put ice in a plastic bag. ? Place a towel between your skin and the bag. ? Leavethe ice on for 20 minutes, 2-3 times a day.  Try taking hot showers or baths to help relax tight muscles. Eating and drinking  Drink enough fluid to keep your urine pale yellow. Staying well hydrated may help prevent cramps or spasms.  Eat a healthy diet that includes plenty of nutrients to help your muscles function. A healthy diet includes fruits and vegetables, lean protein, whole grains, and low-fat or nonfat dairy products. General instructions  If you are having frequent cramps, avoid intense exercise for several days.  Take over-the-counter and prescription medicines only as told by your health care provider.  Pay attention to any changes in your symptoms.  Keep all follow-up visits as told by your health care provider. This is important. Contact a health care provider if:  Your cramps or spasms get more severe or happen more often.  Your cramps or spasms do not improve over time. Summary  Muscle cramps and spasms occur when a muscle or muscles tighten and you have no control over this   tightening (involuntary muscle contraction). °· The most common place for cramps or spasms to occur is in the calf muscles of the leg. °· Massaging, stretching, and relaxing the affected muscle may relieve the cramp or spasm. °· Drink enough fluid to keep your urine pale yellow. Staying well hydrated may help prevent cramps or spasms. °This information is not intended to replace advice given to you by your health care provider. Make sure you discuss any questions you have with your  health care provider. °Document Revised: 05/22/2017 Document Reviewed: 05/22/2017 °Elsevier Patient Education © 2020 [employer] Inc[employer] ° °

## 2019-10-15 ENCOUNTER — Ambulatory Visit (INDEPENDENT_AMBULATORY_CARE_PROVIDER_SITE_OTHER): Payer: BC Managed Care – PPO | Admitting: Obstetrics and Gynecology

## 2019-10-15 ENCOUNTER — Encounter: Payer: Self-pay | Admitting: Obstetrics and Gynecology

## 2019-10-15 ENCOUNTER — Other Ambulatory Visit: Payer: Self-pay

## 2019-10-15 VITALS — BP 132/74 | HR 117 | Ht 69.0 in | Wt 218.0 lb

## 2019-10-15 DIAGNOSIS — Z90710 Acquired absence of both cervix and uterus: Secondary | ICD-10-CM

## 2019-10-15 NOTE — Progress Notes (Signed)
GYNECOLOGY  VISIT   HPI: 44 y.o.   Single Unavailable Not Hispanic or Latino  female   [phone] with Patient's last menstrual period was 09/09/2019.   here for  4 week post opt visit.  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   4 weeks s/p TLH/BS for AUB and pelvic pain. No pain, some odor from her umbilicus, no erythema or drainage.   GYNECOLOGIC HISTORY: Patient's last menstrual period was 09/09/2019. Contraception: none  Menopausal hormone therapy: none         OB History    Gravida  5   Para  3   Term  3   Preterm      AB  2   Living  3     SAB  2   TAB      Ectopic      Multiple      Live Births                 Patient Active Problem List   Diagnosis Date Noted  . S/P laparoscopic hysterectomy 09/17/2019  . Status post laparoscopic hysterectomy 09/17/2019  . Genetic testing 08/12/2019  . Increased risk of breast cancer 08/06/2019  . Family history of breast cancer   . Family history of prostate cancer   . Vertigo 03/27/2015  . Obesity 03/27/2015  . Essential hypertension 01/28/2015  . History of ovarian cyst 01/28/2015  . Hyperlipidemia 01/28/2015    Past Medical History:  Diagnosis Date  . Elevated cholesterol   . Family history of breast cancer   . Family history of prostate cancer   . Heart murmur   . Hypertension   . Vertigo     Past Surgical History:  Procedure Laterality Date  . CYSTOSCOPY N/A 09/17/2019   Procedure: CYSTOSCOPY;  Surgeon: [name], MD;  Location: [facility];  Service: Gynecology;  Laterality: N/A;  . NO PAST SURGERIES    . TOTAL LAPAROSCOPIC HYSTERECTOMY WITH SALPINGECTOMY N/A 09/17/2019   Procedure: TOTAL LAPAROSCOPIC HYSTERECTOMY, BILATERAL SALPINGECTOMY;  Surgeon: [name], MD;  Location: [facility];  Service: Gynecology;  Laterality: N/A;    Current Outpatient Medications  Medication Sig  Dispense Refill  . amLODipine (NORVASC) 5 MG tablet Take 1 tablet (5 mg total) by mouth daily. 90 tablet 3  . atorvastatin (LIPITOR) 20 MG tablet Take 20 mg by mouth daily.    . cetirizine (ZYRTEC) 10 MG tablet Take 10 mg by mouth daily.     Marland Kitchen docusate sodium (COLACE) 100 MG capsule Take 1 capsule (100 mg total) by mouth 2 (two) times daily. 30 capsule 0  . hydrochlorothiazide (HYDRODIURIL) 12.5 MG tablet Take 1 tablet by mouth daily.    Marland Kitchen ibuprofen (ADVIL) 800 MG tablet Take 1 tablet (800 mg total) by mouth every 6 (six) hours. 30 tablet 0  . methocarbamol (ROBAXIN) 500 MG tablet Take 500 mg by mouth 3 (three) times daily.    . naproxen (NAPROSYN) 500 MG tablet Take 500 mg by mouth 2 (two) times daily with a meal.     No current facility-administered medications for this visit.     ALLERGIES: Chlorhexidine gluconate and Latex  Family History  Problem Relation Age of Onset  . Diabetes Mother   . Cancer Maternal Uncle        bone cancer   . Heart disease Maternal Uncle   .  Dementia Maternal Grandmother   . Breast cancer Paternal Grandmother 24       d. in her 78s  . Breast cancer Cousin 52       mother's maternal first cousin  . Breast cancer Paternal Aunt        dx early 70s  . Breast cancer Maternal Great-grandmother   . Heart attack Father   . Prostate cancer Paternal Uncle 44       dx 2nd time in his 48s  . Prostate cancer Maternal Uncle 58  . Breast cancer Other        PGMs sister dx in her 55s  . Other Paternal Aunt        has breast lumps, watching her closely    Social History   Socioeconomic History  . Marital status: Single    Spouse name: Not on file  . Number of children: 3  . Years of education: 24  . Highest education level: Not on file  Occupational History  . Not on file  Tobacco Use  . Smoking status: Never Smoker  . Smokeless tobacco: Never Used  Vaping Use  . Vaping Use: Never used  Substance and Sexual Activity  . Alcohol use: No  . Drug use:  No  . Sexual activity: Yes  Other Topics Concern  . Not on file  Social History Narrative  . Not on file   Social Determinants of Health   Financial Resource Strain:   . Difficulty of Paying Living Expenses: Not on file  Food Insecurity:   . Worried About Charity fundraiser in the Last Year: Not on file  . Ran Out of Food in the Last Year: Not on file  Transportation Needs:   . Lack of Transportation (Medical): Not on file  . Lack of Transportation (Non-Medical): Not on file  Physical Activity:   . Days of Exercise per Week: Not on file  . Minutes of Exercise per Session: Not on file  Stress:   . Feeling of Stress : Not on file  Social Connections:   . Frequency of Communication with Friends and Family: Not on file  . Frequency of Social Gatherings with Friends and Family: Not on file  . Attends Religious Services: Not on file  . Active Member of Clubs or Organizations: Not on file  . Attends Archivist Meetings: Not on file  . Marital Status: Not on file  Intimate Partner Violence:   . Fear of Current or Ex-Partner: Not on file  . Emotionally Abused: Not on file  . Physically Abused: Not on file  . Sexually Abused: Not on file    Review of Systems  All other systems reviewed and are negative.   PHYSICAL EXAMINATION:    BP 132/74   Pulse (!) 117   Ht 5\' 9"  (1.753 m)   Wt 218 lb (98.9 kg)   LMP 09/09/2019   SpO2 99%   BMI 32.19 kg/m     General appearance: alert, cooperative and appears stated age Abdomen: soft, non-tender; non distended, no masses,  no organomegaly Incisions healing well. Appears to be a small amount of dermabond in her umbilicus, no erythema, no drainage. Umbilicus cleaned with hydrogen peroxide and saline. Unable to dislodge remaining dermabond.  Pelvic: External genitalia:  no lesions              Urethra:  normal appearing urethra with no masses, tenderness or lesions  Bartholins and Skenes: normal                  Vagina: normal appearing vagina with normal color and discharge, no lesions. Cuff is healing well, not tender              Cervix: absent              Bimanual Exam:  Uterus:  uterus absent              Adnexa: no mass, fullness, tenderness                Chaperone was present for exam.  ASSESSMENT 4 weeks s/p TLH, doing well    PLAN Routine f/u No intercourse for 2 more months.

## 2019-10-18 ENCOUNTER — Telehealth: Payer: Self-pay | Admitting: Physical Medicine and Rehabilitation

## 2019-10-18 NOTE — Telephone Encounter (Signed)
Patient called. She would like to [facility] her appointment. Her call back number is [phone]

## 2019-10-21 ENCOUNTER — Other Ambulatory Visit (HOSPITAL_COMMUNITY): Payer: Self-pay | Admitting: Endocrinology

## 2019-10-21 NOTE — Telephone Encounter (Signed)
Left message #1

## 2019-10-22 ENCOUNTER — Ambulatory Visit: Payer: BC Managed Care – PPO | Admitting: Physical Medicine and Rehabilitation

## 2019-10-25 ENCOUNTER — Telehealth: Payer: Self-pay

## 2019-10-25 NOTE — Telephone Encounter (Signed)
Patient called back returning missed call . Says its best to call before10:30 am

## 2019-10-25 NOTE — Telephone Encounter (Signed)
See previous message

## 2019-10-25 NOTE — Telephone Encounter (Signed)
Left message #2 to reschedule appointment. 

## 2019-10-25 NOTE — Telephone Encounter (Signed)
Patient called in wanting to re sch appt.    call back number is [phone]

## 2019-10-25 NOTE — Telephone Encounter (Signed)
Rescheduled

## 2019-10-30 ENCOUNTER — Other Ambulatory Visit: Payer: Self-pay

## 2019-10-30 ENCOUNTER — Ambulatory Visit (INDEPENDENT_AMBULATORY_CARE_PROVIDER_SITE_OTHER): Payer: BC Managed Care – PPO | Admitting: Physical Medicine and Rehabilitation

## 2019-10-30 ENCOUNTER — Telehealth: Payer: Self-pay | Admitting: Physical Medicine and Rehabilitation

## 2019-10-30 ENCOUNTER — Encounter: Payer: Self-pay | Admitting: Physical Medicine and Rehabilitation

## 2019-10-30 DIAGNOSIS — M5116 Intervertebral disc disorders with radiculopathy, lumbar region: Secondary | ICD-10-CM | POA: Diagnosis not present

## 2019-10-30 DIAGNOSIS — M5442 Lumbago with sciatica, left side: Secondary | ICD-10-CM | POA: Diagnosis not present

## 2019-10-30 DIAGNOSIS — R102 Pelvic and perineal pain: Secondary | ICD-10-CM

## 2019-10-30 DIAGNOSIS — G8929 Other chronic pain: Secondary | ICD-10-CM

## 2019-10-30 DIAGNOSIS — M5416 Radiculopathy, lumbar region: Secondary | ICD-10-CM

## 2019-10-30 NOTE — Telephone Encounter (Signed)
Needs auth for [id number]- left S1 TF. Scheduled for tomorrow, 10/21.

## 2019-10-30 NOTE — Telephone Encounter (Signed)
Pt not req Auth#. 

## 2019-10-30 NOTE — Progress Notes (Signed)
Left buttock pain. Burning in left calf later in the day. Burning in left ankle. Numb sensation in second through fifth toes. Symptoms started after hysterectomy in [place].  Was taking 500 mg Naproxen and that was helping. She is out of that now. Numeric Pain Rating Scale and Functional Assessment Average Pain 10 Pain Right Now 8 My pain is constant, burning, dull, tingling and aching Pain is worse with: walking, sitting, standing and some activites Pain improves with: medication   In the last MONTH (on 0-10 scale) has pain interfered with the following?  1. General activity like being  able to carry out your everyday physical activities such as walking, climbing stairs, carrying groceries, or moving a chair?  Rating(10)  2. Relation with others like being able to carry out your usual social activities and roles such as  activities at home, at work and in your community. Rating(10)  3. Enjoyment of life such that you have  been bothered by emotional problems such as feeling anxious, depressed or irritable?  Rating(10)

## 2019-10-31 ENCOUNTER — Ambulatory Visit (INDEPENDENT_AMBULATORY_CARE_PROVIDER_SITE_OTHER): Payer: BC Managed Care – PPO | Admitting: Physical Medicine and Rehabilitation

## 2019-10-31 ENCOUNTER — Ambulatory Visit: Payer: Self-pay

## 2019-10-31 ENCOUNTER — Encounter: Payer: Self-pay | Admitting: Physical Medicine and Rehabilitation

## 2019-10-31 VITALS — BP 125/85 | HR 72

## 2019-10-31 DIAGNOSIS — M5416 Radiculopathy, lumbar region: Secondary | ICD-10-CM

## 2019-10-31 MED ORDER — METHYLPREDNISOLONE ACETATE 80 MG/ML IJ SUSP
80.0000 mg | Freq: Once | INTRAMUSCULAR | Status: AC
  Administered 2019-10-31: 80 mg

## 2019-10-31 NOTE — Progress Notes (Signed)
Pt state lower back that travels down her left hip to her positier thigh down to her calf and ankle. Pt state walking, standing and bending makes the pain worse. Pt state pain meds and heating pads help ease the pain.   Numeric Pain Rating Scale and Functional Assessment Average Pain 9   In the last MONTH (on 0-10 scale) has pain interfered with the following?  1. General activity like being  able to carry out your everyday physical activities such as walking, climbing stairs, carrying groceries, or moving a chair?  Rating(10)   +Driver, -BT, -Dye Allergies.

## 2019-11-11 NOTE — Procedures (Signed)
S1 Lumbosacral Transforaminal Epidural Steroid Injection - Sub-Pedicular Approach with Fluoroscopic Guidance   Patient: Alicia Obrien      Date of Birth: [birth year] MRN: [id number] PCP: Beverley Fiedler, FNP      Visit Date: 10/31/2019   Universal Protocol:    Date/Time: 11/10/2109:53 AM  Consent Given By: the patient  Position:  PRONE  Additional Comments: Vital signs were monitored before and after the procedure. Patient was prepped and draped in the usual sterile fashion. The correct patient, procedure, and site was verified.   Injection Procedure Details:  Procedure Site One Meds Administered:  Meds ordered this encounter  Medications  . methylPREDNISolone acetate (DEPO-MEDROL) injection 80 mg    Laterality: Left  Location/Site:  S1 Foramen   Needle size: 22 ga.  Needle type: Spinal  Needle Placement: Transforaminal  Findings:   -Comments: Excellent flow of contrast along the nerve and into the epidural space.   Procedure Details: After squaring off the sacral end-plate to get a true AP view, the C-arm was positioned so that the best possible view of the S1 foramen was visualized. The soft tissues overlying this structure were infiltrated with 2-3 ml. of 1% Lidocaine without Epinephrine.    The spinal needle was inserted toward the target using a "trajectory" view along the fluoroscope beam.  Under AP and lateral visualization, the needle was advanced so it did not puncture dura. Biplanar projections were used to confirm position. Aspiration was confirmed to be negative for CSF and/or blood. A 1-2 ml. volume of Isovue-250 was injected and flow of contrast was noted at each level. Radiographs were obtained for documentation purposes.   After attaining the desired flow of contrast documented above, a 0.5 to 1.0 ml test dose of 0.25% Marcaine was injected into each respective transforaminal space.  The patient was observed for 90 seconds post injection.   After no sensory deficits were reported, and normal lower extremity motor function was noted,   the above injectate was administered so that equal amounts of the injectate were placed at each foramen (level) into the transforaminal epidural space.   Additional Comments:  The patient tolerated the procedure well Dressing: Band-Aid with 2 x 2 sterile gauze    Post-procedure details: Patient was observed during the procedure. Post-procedure instructions were reviewed.  Patient left the clinic in stable condition.

## 2019-11-11 NOTE — Progress Notes (Signed)
Alicia Obrien - 44 y.o. female MRN [id number]  Date of birth: 1975-10-05  Office Visit Note: Visit Date: 10/31/2019 PCP: Beverley Fiedler, FNP Referred by: Beverley Fiedler, *  Subjective: Chief Complaint  Patient presents with  . Lower Back - Pain  . Left Hip - Pain  . Left Ankle - Pain  . Left Foot - Pain   HPI:  Alicia Obrien is a 44 y.o. female who comes in today at the request of Dr. Laurence Spates for planned Left S1-2 Lumbar epidural steroid injection with fluoroscopic guidance.  The patient has failed conservative care including home exercise, medications, time and activity modification.  This injection will be diagnostic and hopefully therapeutic.  Please see requesting physician notes for further details and justification.   ROS Otherwise per HPI.  Assessment & Plan: Visit Diagnoses:  1. Lumbar radiculopathy     Plan: No additional findings.   Meds & Orders:  Meds ordered this encounter  Medications  . methylPREDNISolone acetate (DEPO-MEDROL) injection 80 mg    Orders Placed This Encounter  Procedures  . XR C-ARM NO REPORT  . Epidural Steroid injection    Follow-up: Return if symptoms worsen or fail to improve.   Procedures: No procedures performed  S1 Lumbosacral Transforaminal Epidural Steroid Injection - Sub-Pedicular Approach with Fluoroscopic Guidance   Patient: Alicia Obrien      Date of Birth: 07-23-1975 MRN: [id number] PCP: Beverley Fiedler, FNP      Visit Date: 10/31/2019   Universal Protocol:    Date/Time: 11/10/2109:53 AM  Consent Given By: the patient  Position:  PRONE  Additional Comments: Vital signs were monitored before and after the procedure. Patient was prepped and draped in the usual sterile fashion. The correct patient, procedure, and site was verified.   Injection Procedure Details:  Procedure Site One Meds Administered:  Meds ordered this encounter  Medications  . methylPREDNISolone acetate  (DEPO-MEDROL) injection 80 mg    Laterality: Left  Location/Site:  S1 Foramen   Needle size: 22 ga.  Needle type: Spinal  Needle Placement: Transforaminal  Findings:   -Comments: Excellent flow of contrast along the nerve and into the epidural space.   Procedure Details: After squaring off the sacral end-plate to get a true AP view, the C-arm was positioned so that the best possible view of the S1 foramen was visualized. The soft tissues overlying this structure were infiltrated with 2-3 ml. of 1% Lidocaine without Epinephrine.    The spinal needle was inserted toward the target using a "trajectory" view along the fluoroscope beam.  Under AP and lateral visualization, the needle was advanced so it did not puncture dura. Biplanar projections were used to confirm position. Aspiration was confirmed to be negative for CSF and/or blood. A 1-2 ml. volume of Isovue-250 was injected and flow of contrast was noted at each level. Radiographs were obtained for documentation purposes.   After attaining the desired flow of contrast documented above, a 0.5 to 1.0 ml test dose of 0.25% Marcaine was injected into each respective transforaminal space.  The patient was observed for 90 seconds post injection.  After no sensory deficits were reported, and normal lower extremity motor function was noted,   the above injectate was administered so that equal amounts of the injectate were placed at each foramen (level) into the transforaminal epidural space.   Additional Comments:  The patient tolerated the procedure well Dressing: Band-Aid with 2 x 2 sterile gauze  Post-procedure details: Patient was observed during the procedure. Post-procedure instructions were reviewed.  Patient left the clinic in stable condition.     Clinical History: DG HIP (WITH OR WITHOUT PELVIS) 2-3V LEFT  COMPARISON:  None  FINDINGS: No sign of fracture of the bony pelvis.  Mild hip degenerative changes  bilaterally.  No sign of fracture or dislocation about the LEFT hip.  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 No sign of acute fracture or dislocation.   Electronically Signed   By: Zetta Bills M.D.   On: 09/21/2019 10:01     Objective:  VS:  HT:    WT:   BMI:     BP:125/85  HR:72bpm  TEMP: ( )  RESP:  Physical Exam Constitutional:      General: She is not in acute distress.    Appearance: Normal appearance. She is not ill-appearing.  HENT:     Head: Normocephalic and atraumatic.     Right Ear: External ear normal.     Left Ear: External ear normal.  Eyes:     Extraocular Movements: Extraocular movements intact.  Cardiovascular:     Rate and Rhythm: Normal rate.     Pulses: Normal pulses.  Musculoskeletal:     Right lower leg: No edema.     Left lower leg: No edema.     Comments: Patient has good distal strength with no pain over the greater trochanters.  No clonus or focal weakness.  Skin:    Findings: No erythema, lesion or rash.  Neurological:     General: No focal deficit present.     Mental Status: She is alert and oriented to person, place, and time.     Sensory: No sensory deficit.     Motor: No weakness or abnormal muscle tone.     Coordination: Coordination normal.  Psychiatric:        Mood and Affect: Mood normal.        Behavior: Behavior normal.      Imaging: No results found.

## 2019-11-17 DIAGNOSIS — I10 Essential (primary) hypertension: Secondary | ICD-10-CM | POA: Diagnosis not present

## 2019-11-17 DIAGNOSIS — Z79899 Other long term (current) drug therapy: Secondary | ICD-10-CM | POA: Diagnosis not present

## 2019-11-17 DIAGNOSIS — E559 Vitamin D deficiency, unspecified: Secondary | ICD-10-CM | POA: Diagnosis not present

## 2019-11-17 DIAGNOSIS — R7303 Prediabetes: Secondary | ICD-10-CM | POA: Diagnosis not present

## 2019-11-17 DIAGNOSIS — E7800 Pure hypercholesterolemia, unspecified: Secondary | ICD-10-CM | POA: Diagnosis not present

## 2019-11-19 ENCOUNTER — Other Ambulatory Visit: Payer: Self-pay | Admitting: Obstetrics and Gynecology

## 2019-11-19 ENCOUNTER — Other Ambulatory Visit: Payer: Self-pay

## 2019-11-19 ENCOUNTER — Ambulatory Visit (INDEPENDENT_AMBULATORY_CARE_PROVIDER_SITE_OTHER): Payer: 59 | Admitting: Internal Medicine

## 2019-11-19 ENCOUNTER — Encounter: Payer: Self-pay | Admitting: Internal Medicine

## 2019-11-19 DIAGNOSIS — J340 Abscess, furuncle and carbuncle of nose: Secondary | ICD-10-CM | POA: Diagnosis not present

## 2019-11-19 DIAGNOSIS — K5900 Constipation, unspecified: Secondary | ICD-10-CM

## 2019-11-19 MED ORDER — DOXYCYCLINE HYCLATE 100 MG PO TABS: 100.0000 mg | ORAL_TABLET | Freq: Two times a day (BID) | ORAL | 0 refills | Status: DC

## 2019-11-19 NOTE — Patient Instructions (Addendum)
Ms. Alicia Obrien,  It was a pleasure to see you today. Thank you for coming in.   Today we discussed your nose pain and redness. This looks like a skin infection. Please start taking doxycycline 100 mg twice a day for the next week. Please monitor for spreading of the redness and pain or development of difficulty breathing. If your symptoms worsen or do not improve we may refer you to the ENT doctors.   Please return to clinic if needed.   Thank you again for coming in.   Asencion Noble.D.

## 2019-11-19 NOTE — Assessment & Plan Note (Signed)
She reports that on Saturday she had a mosquito bite of her left nostril, the following day she had some more swelling and tenderness and had some yellow pus drained from the bite. The pain and swelling worsened over the next few days and the swelling went to the tip of her nostril. The pain is worse when she breaths through her nose. Pain is 7/10 at this time. She denies any difficulty breathing, fevers, chills, nausea, vomiting, chest pain, SOB, or other symptoms. She reports having a small pimple on her right cheek a few weeks ago that required antibiotics.   Vitals are stable, afebrile. On exam she has erythema and edema located on the tip of her nostril, she has a small scar over the inner portion of her left nostril, no drainage noted at this time.  Discussed treatment with empiric antibiotics, appears to be a skin infection, likely a furuncle. Discussed that she does not require ENT referral at this time however if symptoms worsen or do not improve then she may need referral.   -Doxycycline 100 mg BID x7 days -Advised on return precautions -Consider ENT referral if symptoms persist or do not improve with antibiotics

## 2019-11-19 NOTE — Progress Notes (Signed)
   CC: Nose infection  HPI:  [name] is a 44 y.o. with the history listed below presenting for a 4 day history of redness, swelling, and pain of her left nostril.   Past Medical History:  Diagnosis Date  . Elevated cholesterol   . Family history of breast cancer   . Family history of prostate cancer   . Heart murmur   . Hypertension   . Vertigo    Review of Systems:   Constitutional: Negative for chills and fever.  HEENT: Positive for nose pain and redness. Respiratory: Negative for shortness of breath.   Cardiovascular: Negative for chest pain and leg swelling.  Gastrointestinal: Negative for abdominal pain, nausea and vomiting.  Neurological: Negative for dizziness and headaches.   Physical Exam:  Vitals:   11/19/19 1032  BP: (!) 133/93  Pulse: 72  Temp: 98.6 F (37 C)  TempSrc: Oral  SpO2: 100%  Weight: 224 lb (101.6 kg)  Height: 5\' 9"  (1.753 m)   Physical Exam Constitutional:      Appearance: Normal appearance.  HENT:     Head: Normocephalic and atraumatic.     Nose:     Comments: Left interior nostril with small furuncle, no drainage noted, erythema and edema of nasal tip Cardiovascular:     Rate and Rhythm: Normal rate and regular rhythm.     Pulses: Normal pulses.     Heart sounds: Normal heart sounds.  Pulmonary:     Effort: Pulmonary effort is normal.     Breath sounds: Normal breath sounds.  Neurological:     General: No focal deficit present.     Mental Status: She is alert and oriented to person, place, and time.  Psychiatric:        Mood and Affect: Mood normal.        Behavior: Behavior normal.          Assessment & Plan:   See Encounters Tab for problem based charting.  Patient discussed with Dr. Rebeca Alert

## 2019-11-20 NOTE — Progress Notes (Signed)
Internal Medicine Clinic Attending  Case discussed with Dr. Krienke at the time of the visit.  We reviewed the resident's history and exam and pertinent patient test results.  I agree with the assessment, diagnosis, and plan of care documented in the resident's note.  Monta Maiorana, M.D., Ph.D.  

## 2019-11-20 NOTE — Telephone Encounter (Signed)
Medication refill request: 100mg  Last OV: 10/15/19  Next AEX: 07/23/20  Last MMG (if hormonal medication request): 07/19/19  Neg  Refill authorized: 30/0

## 2019-11-26 ENCOUNTER — Other Ambulatory Visit: Payer: Self-pay | Admitting: Physical Medicine and Rehabilitation

## 2019-11-26 ENCOUNTER — Encounter: Payer: Self-pay | Admitting: Physical Medicine and Rehabilitation

## 2019-11-26 ENCOUNTER — Other Ambulatory Visit: Payer: Self-pay

## 2019-11-26 ENCOUNTER — Ambulatory Visit (INDEPENDENT_AMBULATORY_CARE_PROVIDER_SITE_OTHER): Payer: 59 | Admitting: Physical Medicine and Rehabilitation

## 2019-11-26 VITALS — BP 126/85 | HR 65

## 2019-11-26 DIAGNOSIS — G8929 Other chronic pain: Secondary | ICD-10-CM

## 2019-11-26 DIAGNOSIS — M5442 Lumbago with sciatica, left side: Secondary | ICD-10-CM

## 2019-11-26 DIAGNOSIS — M5116 Intervertebral disc disorders with radiculopathy, lumbar region: Secondary | ICD-10-CM

## 2019-11-26 DIAGNOSIS — M5416 Radiculopathy, lumbar region: Secondary | ICD-10-CM

## 2019-11-26 DIAGNOSIS — M5136 Other intervertebral disc degeneration, lumbar region: Secondary | ICD-10-CM | POA: Diagnosis not present

## 2019-11-26 MED ORDER — NAPROXEN 500 MG PO TABS: 500.0000 mg | ORAL_TABLET | Freq: Two times a day (BID) | ORAL | 1 refills | Status: DC

## 2019-11-26 MED ORDER — PREDNISONE 50 MG PO TABS: ORAL_TABLET | ORAL | 0 refills | Status: DC

## 2019-11-26 NOTE — Progress Notes (Signed)
Injection in October worked for about 2 weeks. States that she thinks the pain started to come back because of how far she has to walk from parking lot at work. Left leg pain has started to some back, but not the whole leg anymore. She states that there are certain points in the left leg where she feels pain. Has been taking Aleve and ibuprofen. Notices that the pain gets worse in late afternoon and is also causing difficulty getting comfortable to sleep at night. Numeric Pain Rating Scale and Functional Assessment Average Pain 10   In the last MONTH (on 0-10 scale) has pain interfered with the following?  1. General activity like being  able to carry out your everyday physical activities such as walking, climbing stairs, carrying groceries, or moving a chair?  Rating(8)

## 2019-11-27 ENCOUNTER — Encounter: Payer: Self-pay | Admitting: Physical Medicine and Rehabilitation

## 2019-11-27 DIAGNOSIS — M5416 Radiculopathy, lumbar region: Secondary | ICD-10-CM | POA: Insufficient documentation

## 2019-11-27 NOTE — Progress Notes (Signed)
Alicia Obrien - 44 y.o. female MRN [id number]  Date of birth: [birth year]  Office Visit Note: Visit Date: 10/30/2019 PCP: Beverley Fiedler, FNP Referred by: Beverley Fiedler, *  Subjective: Chief Complaint  Patient presents with  . Left Leg - Pain   HPI: Alicia Obrien is a 44 y.o. female who comes in today As a new patient evaluation and self-referral for chronic severe left buttock hip and leg pain with paresthesia.  She was actually seen here by a patient of ours Eliot Ford who is her fianc.  Her history is such that she has had chronic pelvic pain for many years.  She has had history of pregnancy and delivery as well as miscarriage in the past.  Prior to relocating back to [place] around 2017 she was in [place].  She was told that she had something pressing on her sciatic nerve and she did have episodes of pain into the back and buttock region at that time.  She did not have pain shooting down the leg as much then but she would get intermittent relief with having going to the emergency department and getting localized injection.  No history of epidural steroid injections or lumbar spine surgery or advanced imaging.  Her current episode of pain began a few days after hysterectomy performed at [facility] in early September.  This was September 17, 2019.  On September 21, 2018 when she went to emergency department with severe left buttock pain.  She reported that just during a walk she felt somewhat of a pop in her back and severe buttock pain at that time.  They did give her IV Valium and Toradol and this did seem to relax things.  At the time she noted fasciculations and cramping and spasm in the left leg in a pretty classic S1 distribution.  She has now worsening symptoms in the afternoon with ambulation and working.  It will travel into the posterior lateral calf and of the left ankle.  The numbness sensation is more in the foot and the second through fifth  digits again consistent with an S1 radiculopathy.  She has no symptoms on the right.  She has not noted any foot drop or focal weakness.  She rates her symptoms on average as a 10 out of 10 which is constant burning dull type pain and aching pain with tingling.  She gets some improvement with 500 mg of Naprosyn taken twice a day.  Muscle relaxer Robaxin has not helped that much.  She has had x-rays performed of the pelvis and hip and those were reviewed today and reviewed below in the notes.  We did show her the images showing fairly normal-appearing lumbar spine at bases L5-S1 for her age.  Some mild degenerative osteoarthritic changes of the hip and lumbar spine.  No prior history of lumbar surgery.  It is really limiting what she can do during the day and is affecting her work to some degree.  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  Review of Systems  Musculoskeletal: Positive for back pain.       Left buttock and leg radicular type pain  Neurological: Positive for tingling.  All other systems reviewed and are negative.  Otherwise per HPI.  Assessment & Plan: Visit Diagnoses:  1. Lumbar radiculopathy   2. Radiculopathy due to lumbar intervertebral disc disorder   3. Chronic left-sided low  back pain with left-sided sciatica   4. Pelvic pain     Plan: Findings:  History of chronic pelvic pain and lumbar and buttock pain for many years with history of gynecologic issues status post now hysterectomy.  Severe pain over the last 8 weeks after hysterectomy.  Pain is very consistent with an S1 radiculopathy from likely herniated disc at L5-S1.  I do not think this is related to the hysterectomy.  She has had follow-up with her gynecologist.  They felt like something may have been pressing on the sciatic nerve.  I went over a lot of anatomy today of the lumbar spine and nerve roots and sciatic nerve etc.  She has been doing pretty well with the  Naprosyn and will continue that.  I think given the severity of the symptoms and the classic nature of this being looked at as an S1 radicular pain the next approach to get her some relief and to be diagnostic would be an S1 transforaminal epidural steroid injection using fluoroscopic guidance.  If she gets a lot of relief with this it is very beneficial regroup with physical therapy at home exercise and medication management and hopefully that will help over time.  If it does really well but is very short-lived than the neck step would be MRI of the lumbar spine.  This is been a chronic ongoing situation for many years but now declared itself to be more of a radicular pain likely from an L5-S1 herniated disc.  From an injection standpoint case is complicated by chlorhexidine allergy so we will just use alcohol for antiseptic.  We also talked about home exercises with neural flossing and she will start those.    Meds & Orders: No orders of the defined types were placed in this encounter.  No orders of the defined types were placed in this encounter.   Follow-up: Return for Left S1 transforaminal epidural steroid injection.   Procedures: No procedures performed  No notes on file   Clinical History: DG HIP (WITH OR WITHOUT PELVIS) 2-3V LEFT  COMPARISON:  None  FINDINGS: No sign of fracture of the bony pelvis.  Mild hip degenerative changes bilaterally.  No sign of fracture or dislocation about the LEFT hip.  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 No sign of acute fracture or dislocation.   Electronically Signed   By: Zetta Bills M.D.   On: 09/21/2019 10:01   She reports that she has never smoked. She has never used smokeless tobacco. No results for input(s): HGBA1C, LABURIC in the last 8760 hours.  Objective:  VS:  HT:    WT:   BMI:     BP:   HR: bpm  TEMP: ( )  RESP:  Physical Exam Vitals and nursing note reviewed.  Constitutional:       General: She is not in acute distress.    Appearance: Normal appearance. She is well-developed. She is obese.  HENT:     Head: Normocephalic and atraumatic.     Nose: Nose normal.     Mouth/Throat:     Mouth: Mucous membranes are moist.     Pharynx: Oropharynx is clear.  Eyes:     Conjunctiva/sclera: Conjunctivae normal.     Pupils: Pupils are equal, round, and reactive to light.  Cardiovascular:     Rate and Rhythm: Regular rhythm.  Pulmonary:     Effort: Pulmonary effort is normal. No respiratory distress.  Abdominal:     General: There  is no distension.     Palpations: Abdomen is soft.     Tenderness: There is no guarding.  Musculoskeletal:        General: Tenderness present.     Cervical back: Normal range of motion and neck supple. No tenderness.     Right lower leg: No edema.     Left lower leg: No edema.     Comments: Patient sits in the chair uncomfortably in the sort of keep weight off the left hip.  No pain over the greater trochanters and no pain with hip rotation no groin pain.  She has good distal strength without clonus bilaterally.  She does have impaired sensation in S1 dermatome on the left compared to right.  She has positive slump test on the left.  Skin:    General: Skin is warm and dry.     Findings: No erythema or rash.  Neurological:     General: No focal deficit present.     Mental Status: She is alert and oriented to person, place, and time.     Sensory: Sensory deficit present.     Motor: No weakness or abnormal muscle tone.     Coordination: Coordination normal.     Gait: Gait abnormal.  Psychiatric:        Mood and Affect: Mood normal.        Behavior: Behavior normal.        Thought Content: Thought content normal.     Ortho Exam  Imaging: No results found.  Past Medical/Family/Surgical/Social History: Medications & Allergies reviewed per EMR, new medications updated. Patient Active Problem List   Diagnosis Date Noted  . Lumbar  radiculopathy 11/27/2019  . Furuncle of nose 11/19/2019  . S/P laparoscopic hysterectomy 09/17/2019  . Status post laparoscopic hysterectomy 09/17/2019  . Genetic testing 08/12/2019  . Increased risk of breast cancer 08/06/2019  . Family history of breast cancer   . Family history of prostate cancer   . Vertigo 03/27/2015  . Obesity 03/27/2015  . Essential hypertension 01/28/2015  . History of ovarian cyst 01/28/2015  . Hyperlipidemia 01/28/2015   Past Medical History:  Diagnosis Date  . Elevated cholesterol   . Family history of breast cancer   . Family history of prostate cancer   . Heart murmur   . Hypertension   . Vertigo    Family History  Problem Relation Age of Onset  . Diabetes Mother   . Cancer Maternal Uncle        bone cancer   . Heart disease Maternal Uncle   . Dementia Maternal Grandmother   . Breast cancer Paternal Grandmother 54       d. in her 82s  . Breast cancer Cousin 15       mother's maternal first cousin  . Breast cancer Paternal Aunt        dx early 49s  . Breast cancer Maternal Great-grandmother   . Heart attack Father   . Prostate cancer Paternal Uncle 96       dx 2nd time in his 77s  . Prostate cancer Maternal Uncle 58  . Breast cancer Other        PGMs sister dx in her 67s  . Other Paternal Aunt        has breast lumps, watching her closely   Past Surgical History:  Procedure Laterality Date  . CYSTOSCOPY N/A 09/17/2019   Procedure: CYSTOSCOPY;  Surgeon: [name], MD;  Location: [facility];  Service: Gynecology;  Laterality: N/A;  . NO PAST SURGERIES    . TOTAL LAPAROSCOPIC HYSTERECTOMY WITH SALPINGECTOMY N/A 09/17/2019   Procedure: TOTAL LAPAROSCOPIC HYSTERECTOMY, BILATERAL SALPINGECTOMY;  Surgeon: [name], MD;  Location: [facility];  Service: Gynecology;  Laterality: N/A;   Social History   Occupational History  . Not on file  Tobacco Use  . Smoking status: Never Smoker  .  Smokeless tobacco: Never Used  Vaping Use  . Vaping Use: Never used  Substance and Sexual Activity  . Alcohol use: No  . Drug use: No  . Sexual activity: Yes

## 2019-11-27 NOTE — Progress Notes (Addendum)
Alicia Obrien - 44 y.o. female MRN [id number]  Date of birth: 1975-07-03  Office Visit Note: Visit Date: 11/26/2019 PCP: Beverley Fiedler, FNP Referred by: Beverley Fiedler, *  Subjective: Chief Complaint  Patient presents with  . Lower Back - Pain  . Left Leg - Pain   HPI: Alicia Obrien is a 44 y.o. female who comes in today For evaluation management of chronic worsening severe left hip and leg pain consistent with S1 radiculitis radiculopathy with likely disc herniation.  She is status post S1 transforaminal epidural steroid injection fluoroscopic guidance on 10/31/2019 with excellent almost 100% relief for 2 weeks and then return of symptoms.  She is still somewhat better than she was when we first saw her.  She is not getting the spasming and sharpness that she wants did but she does feel this coming back quite significantly.  She has a lot of pain walking from the parking lot at work to get into work.  She reports this not the whole leg anymore but it is in the hip and it seems to skip certain areas down the leg now.  She was taking Naprosyn twice a day but is now taking ibuprofen mainly at night to help her get some rest.  She reports worsening in the afternoon after working and being up and down a lot.  She does report her pain as a 10 out of 10.  She denies any right-sided symptoms.  No focal weakness no bowel or bladder symptoms no real changes otherwise.  Please see our prior notes for further review.  Review of Systems  Musculoskeletal: Positive for back pain.       Left radicular leg pain  Neurological: Positive for tingling.  All other systems reviewed and are negative.  Otherwise per HPI.  Assessment & Plan: Visit Diagnoses:  1. Lumbar radiculopathy   2. Radiculopathy due to lumbar intervertebral disc disorder   3. Chronic left-sided low back pain with left-sided sciatica   4. Other intervertebral disc degeneration, lumbar region     Plan: Findings:  S1  left-sided radiculopathy likely from disc herniation status post diagnostic S1 transforaminal epidural steroid injection with good relief of symptoms almost 100% for 2 weeks and now with continued some relief of pain compared to the past.  Unfortunately still calls her pain a 10 out of 10 but I think that is when it is at its worst.  She is doing better.  At this point I think the best approach is lumbar spine MRI because of symptoms just really returned quickly.  She has had this particular pain now for quite some time and essentially started just after hysterectomy performed in September here in [place].  She did report prior history of similar pain at 1 point in [place] where she lived prior to relocating here around 2017.  Prior imaging but no MRI of the lumbar spine.  No history of back surgery etc.  No real red flag complaints other than radicular pain with decreased sensation to light touch and positive slump test.  Failure of conservative care including home exercise treatment as well as physical therapy and medication management which has included anti-inflammatories and pain medication in the past.  Muscle relaxers have not really helped her.  We are going to prescribe Naprosyn twice a day and a real short course of prednisone while we are awaiting the MRI results.    Meds & Orders:  Meds ordered this encounter  Medications  .  predniSONE (DELTASONE) 50 MG tablet    Sig: Take 1 tablet daily with food for 3 days until finished    Dispense:  3 tablet    Refill:  0  . naproxen (NAPROSYN) 500 MG tablet    Sig: Take 1 tablet (500 mg total) by mouth 2 (two) times daily with a meal.    Dispense:  60 tablet    Refill:  1    Orders Placed This Encounter  Procedures  . MR LUMBAR SPINE WO CONTRAST    Follow-up: Return for MRI review after completion.   Procedures: No procedures performed  No notes on file   Clinical History: DG HIP (WITH OR WITHOUT PELVIS) 2-3V LEFT  COMPARISON:   None  FINDINGS: No sign of fracture of the bony pelvis.  Mild hip degenerative changes bilaterally.  No sign of fracture or dislocation about the LEFT hip.  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 No sign of acute fracture or dislocation.   Electronically Signed   By: Zetta Bills M.D.   On: 09/21/2019 10:01   She reports that she has never smoked. She has never used smokeless tobacco. No results for input(s): HGBA1C, LABURIC in the last 8760 hours.  Objective:  VS:  HT:    WT:   BMI:     BP:126/85  HR:65bpm  TEMP: ( )  RESP:  Physical Exam Constitutional:      General: She is not in acute distress.    Appearance: Normal appearance. She is not ill-appearing.  HENT:     Head: Normocephalic and atraumatic.     Right Ear: External ear normal.     Left Ear: External ear normal.  Eyes:     Extraocular Movements: Extraocular movements intact.  Cardiovascular:     Rate and Rhythm: Normal rate.     Pulses: Normal pulses.  Musculoskeletal:        General: Tenderness present.     Cervical back: Normal range of motion and neck supple. No tenderness.     Right lower leg: No edema.     Left lower leg: No edema.     Comments: Patient has good distal strength with no pain over the greater trochanters.  No clonus or focal weakness.  She has a positive slump test on the left but does seem less agitated than the prior exam.  She does have impaired sensation to light touch in S1 dermatome on the left and not the right.  Skin:    General: Skin is warm and dry.     Findings: No erythema, lesion or rash.  Neurological:     General: No focal deficit present.     Mental Status: She is alert and oriented to person, place, and time.     Sensory: Sensory deficit present.     Motor: No weakness or abnormal muscle tone.     Coordination: Coordination normal.     Gait: Gait abnormal.  Psychiatric:        Mood and Affect: Mood normal.        Behavior:  Behavior normal.     Ortho Exam  Imaging: No results found.  Past Medical/Family/Surgical/Social History: Medications & Allergies reviewed per EMR, new medications updated. Patient Active Problem List   Diagnosis Date Noted  . Furuncle of nose 11/19/2019  . S/P laparoscopic hysterectomy 09/17/2019  . Status post laparoscopic hysterectomy 09/17/2019  . Genetic testing 08/12/2019  . Increased risk of breast cancer 08/06/2019  . Family  history of breast cancer   . Family history of prostate cancer   . Vertigo 03/27/2015  . Obesity 03/27/2015  . Essential hypertension 01/28/2015  . History of ovarian cyst 01/28/2015  . Hyperlipidemia 01/28/2015   Past Medical History:  Diagnosis Date  . Elevated cholesterol   . Family history of breast cancer   . Family history of prostate cancer   . Heart murmur   . Hypertension   . Vertigo    Family History  Problem Relation Age of Onset  . Diabetes Mother   . Cancer Maternal Uncle        bone cancer   . Heart disease Maternal Uncle   . Dementia Maternal Grandmother   . Breast cancer Paternal Grandmother 48       d. in her 8s  . Breast cancer Cousin 47       mother's maternal first cousin  . Breast cancer Paternal Aunt        dx early 86s  . Breast cancer Maternal Great-grandmother   . Heart attack Father   . Prostate cancer Paternal Uncle 47       dx 2nd time in his 32s  . Prostate cancer Maternal Uncle 58  . Breast cancer Other        PGMs sister dx in her 35s  . Other Paternal Aunt        has breast lumps, watching her closely   Past Surgical History:  Procedure Laterality Date  . CYSTOSCOPY N/A 09/17/2019   Procedure: CYSTOSCOPY;  Surgeon: [name], MD;  Location: [facility];  Service: Gynecology;  Laterality: N/A;  . NO PAST SURGERIES    . TOTAL LAPAROSCOPIC HYSTERECTOMY WITH SALPINGECTOMY N/A 09/17/2019   Procedure: TOTAL LAPAROSCOPIC HYSTERECTOMY, BILATERAL SALPINGECTOMY;  Surgeon: [name], MD;  Location: [facility];  Service: Gynecology;  Laterality: N/A;   Social History   Occupational History  . Not on file  Tobacco Use  . Smoking status: Never Smoker  . Smokeless tobacco: Never Used  Vaping Use  . Vaping Use: Never used  Substance and Sexual Activity  . Alcohol use: No  . Drug use: No  . Sexual activity: Yes

## 2019-12-14 ENCOUNTER — Other Ambulatory Visit: Payer: Self-pay

## 2019-12-14 ENCOUNTER — Ambulatory Visit
Admission: RE | Admit: 2019-12-14 | Discharge: 2019-12-14 | Disposition: A | Discharge status: Home / Self Care | Payer: 59 | Source: Ambulatory Visit | Attending: Physical Medicine and Rehabilitation | Admitting: Physical Medicine and Rehabilitation

## 2019-12-14 DIAGNOSIS — M5127 Other intervertebral disc displacement, lumbosacral region: Secondary | ICD-10-CM | POA: Diagnosis not present

## 2019-12-17 ENCOUNTER — Telehealth: Payer: Self-pay | Admitting: Physical Medicine and Rehabilitation

## 2019-12-17 NOTE — Telephone Encounter (Signed)
Left message #1 to schedule MRI review with possible repeat S1 TF.

## 2019-12-17 NOTE — Telephone Encounter (Signed)
Scheduled

## 2019-12-17 NOTE — Telephone Encounter (Signed)
-----   Message from Magnus Sinning, MD sent at 12/17/2019  8:08 AM EST ----- Regarding: not sure f/up MRI She has exactly what we though with disc HNP at L5-S1. Can f/u to discuss and show pics rpeat S1 if needed or talk about when to think surgery.

## 2019-12-30 ENCOUNTER — Ambulatory Visit: Payer: Self-pay

## 2019-12-30 ENCOUNTER — Encounter: Payer: Self-pay | Admitting: Physical Medicine and Rehabilitation

## 2019-12-30 ENCOUNTER — Other Ambulatory Visit: Payer: Self-pay

## 2019-12-30 ENCOUNTER — Ambulatory Visit (INDEPENDENT_AMBULATORY_CARE_PROVIDER_SITE_OTHER): Payer: 59 | Admitting: Physical Medicine and Rehabilitation

## 2019-12-30 VITALS — BP 126/85 | HR 96

## 2019-12-30 DIAGNOSIS — M5417 Radiculopathy, lumbosacral region: Secondary | ICD-10-CM | POA: Diagnosis not present

## 2019-12-30 DIAGNOSIS — M5116 Intervertebral disc disorders with radiculopathy, lumbar region: Secondary | ICD-10-CM

## 2019-12-30 MED ORDER — BETAMETHASONE SOD PHOS & ACET 6 (3-3) MG/ML IJ SUSP: 12.0000 mg | Freq: Once | INTRAMUSCULAR | Status: DC

## 2019-12-30 NOTE — Progress Notes (Signed)
Pt state lower back left buttocks down to her left knee. Pt state standing and sitting for a long period of time makes the pain worse. Pt state she tries to move around and take pain meds to help ease the pain. Pt has hx of inj on 10/212021 pt state it worked well and lasted for two weeks.  Numeric Pain Rating Scale and Functional Assessment Average Pain 5   In the last MONTH (on 0-10 scale) has pain interfered with the following?  1. General activity like being  able to carry out your everyday physical activities such as walking, climbing stairs, carrying groceries, or moving a chair?  Rating(10)   +Driver, -BT, -Dye Allergies.

## 2020-01-13 ENCOUNTER — Encounter: Payer: Self-pay | Admitting: Physical Medicine and Rehabilitation

## 2020-01-13 NOTE — Progress Notes (Signed)
Alicia Obrien - 45 y.o. female MRN [id number]  Date of birth: [birth year]  Office Visit Note: Visit Date: 12/30/2019 PCP: Beverley Fiedler, FNP Referred by: Beverley Fiedler, *  Subjective: Chief Complaint  Patient presents with  . Lower Back - Pain  . Left Hip - Pain  . Left Knee - Pain   HPI:  Alicia Obrien is a 45 y.o. female who comes in today For continued evaluation management of chronic severe left lower back buttock and hip and leg pain consistent with an S1 radiculopathy.  We now have MRI confirmation of herniated disc at L5-S1 which is left paracentral and affecting the S1 nerve root on the left.  This has been consistent with her symptoms and she has done well with epidural injection performed in October.  She reports 2 weeks of almost 100% relief and overall still doing much better than she was when we first saw her.  Rates her pain now is a 5 out of 10 but does significantly affect her daily activities.  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  She continues with home exercises and neural flossing and has had physical therapy in the past.  No prior history of lumbar surgery.  No red flag complaints.  MRI was reviewed with her today with spine models and imaging.  Review of Systems  Musculoskeletal: Positive for back pain.       Left hip and leg pain.  Neurological: Positive for tingling.  All other systems reviewed and are negative.  Otherwise per HPI.  Assessment & Plan: Visit Diagnoses:    ICD-10-CM   1. Radiculopathy due to lumbar intervertebral disc disorder  M51.16 XR C-ARM NO REPORT    Epidural Steroid injection    betamethasone acetate-betamethasone sodium phosphate (CELESTONE) injection 12 mg  2. Lumbosacral radiculopathy  M54.17     Plan: Findings:  Chronic worsening severe left hip and leg pain consistent with an S1 radiculopathy.  Positive slump test and decreased sensation in a S1 dermatome.  Now with MRI evidence of L5-S1 herniated disc  to the left paracentral impacting the S1 nerve root.  She still has good active strength and still functionally does well except for the pain.  Despite medications and home exercises with continued pain.  Discussed with her at length today microdiscectomy as a possible solution for her depending on how she is doing.  She does not really want entertain that idea as of yet.  She has looked into seeing a neurosurgeon.  At this point we will repeat the S1 injection to see if we get her some more relief.  She will follow up with me as needed.    Meds & Orders:  Meds ordered this encounter  Medications  . betamethasone acetate-betamethasone sodium phosphate (CELESTONE) injection 12 mg    Orders Placed This Encounter  Procedures  . XR C-ARM NO REPORT  . Epidural Steroid injection    Follow-up: Return if symptoms worsen or fail to improve.   Procedures: No procedures performed  S1 Lumbosacral Transforaminal Epidural Steroid Injection - Sub-Pedicular Approach with Fluoroscopic Guidance   Patient: Alicia Obrien      Date of Birth: 19-Jul-1975 MRN: [id number] PCP: Beverley Fiedler, FNP      Visit Date: 12/30/2019   Universal Protocol:    Date/Time: 01/03/226:24 AM  Consent Given By: the patient  Position:  PRONE  Additional Comments: Vital signs were monitored before and after the procedure. Patient  was prepped and draped in the usual sterile fashion. The correct patient, procedure, and site was verified.   Injection Procedure Details:  Procedure Site One Meds Administered:  Meds ordered this encounter  Medications  . betamethasone acetate-betamethasone sodium phosphate (CELESTONE) injection 12 mg    Laterality: Left  Location/Site:  S1 Foramen   Needle size: 22 ga.  Needle type: Spinal  Needle Placement: Transforaminal  Findings:   -Comments: Excellent flow of contrast along the nerve, nerve root and into the epidural space.   Procedure Details: After  squaring off the sacral end-plate to get a true AP view, the C-arm was positioned so that the best possible view of the S1 foramen was visualized. The soft tissues overlying this structure were infiltrated with 2-3 ml. of 1% Lidocaine without Epinephrine.    The spinal needle was inserted toward the target using a "trajectory" view along the fluoroscope beam.  Under AP and lateral visualization, the needle was advanced so it did not puncture dura. Biplanar projections were used to confirm position. Aspiration was confirmed to be negative for CSF and/or blood. A 1-2 ml. volume of Isovue-250 was injected and flow of contrast was noted at each level. Radiographs were obtained for documentation purposes.   After attaining the desired flow of contrast documented above, a 0.5 to 1.0 ml test dose of 0.25% Marcaine was injected into each respective transforaminal space.  The patient was observed for 90 seconds post injection.  After no sensory deficits were reported, and normal lower extremity motor function was noted,   the above injectate was administered so that equal amounts of the injectate were placed at each foramen (level) into the transforaminal epidural space.   Additional Comments:  The patient tolerated the procedure well Dressing: Band-Aid with 2 x 2 sterile gauze    Post-procedure details: Patient was observed during the procedure. Post-procedure instructions were reviewed.  Patient left the clinic in stable condition.    Clinical History: MRI LUMBAR SPINE WITHOUT CONTRAST  TECHNIQUE: Multiplanar, multisequence MR imaging of the lumbar spine was performed. No intravenous contrast was administered.  COMPARISON:  None.  FINDINGS: Segmentation:  Standard.  Alignment:  Physiologic.  Vertebrae:  No fracture, evidence of discitis, or bone lesion.  Conus medullaris and cauda equina: Conus extends to the L1 level. Conus and cauda equina appear normal.  Paraspinal and other  soft tissues: No acute paraspinal abnormality.  Disc levels:  Disc spaces: Degenerative disease with mild disc height loss at L5-S1.  T12-L1: No significant disc bulge. No evidence of neural foraminal stenosis. No central canal stenosis.  L1-L2: No significant disc bulge. No evidence of neural foraminal stenosis. No central canal stenosis.  L2-L3: No significant disc bulge. No evidence of neural foraminal stenosis. No central canal stenosis.  L3-L4: No significant disc bulge. No evidence of neural foraminal stenosis. No central canal stenosis.  L4-L5: No significant disc bulge. No evidence of neural foraminal stenosis. No central canal stenosis.  L5-S1: Broad-based disc bulge with a broad left paracentral disc protrusion and mass effect on the left intraspinal S1 nerve root. Mild bilateral facet arthropathy. Mild bilateral foraminal stenosis. No central canal stenosis.  IMPRESSION: 1. At L5-S1 there is a broad-based disc bulge with a broad left paracentral disc protrusion and mass effect on the left intraspinal S1 nerve root. Mild bilateral facet arthropathy. Mild bilateral foraminal stenosis.   Electronically Signed   By: Elige Ko   On: 12/15/2019 17:07     Objective:  VS:  HT:    WT:   BMI:     BP:126/85  HR:96bpm  TEMP: ( )  RESP:  Physical Exam Constitutional:      General: She is not in acute distress.    Appearance: Normal appearance. She is obese. She is not ill-appearing.  HENT:     Head: Normocephalic and atraumatic.     Right Ear: External ear normal.     Left Ear: External ear normal.  Eyes:     Extraocular Movements: Extraocular movements intact.  Cardiovascular:     Rate and Rhythm: Normal rate.     Pulses: Normal pulses.  Musculoskeletal:        General: Tenderness present.     Right lower leg: No edema.     Left lower leg: No edema.     Comments: Patient has good distal strength with no pain over the greater trochanters.   No clonus or focal weakness.  She has pain over the left PSIS.  She has a positive slump test on the left.  She has some impaired sensation in S1 dermatome on the left.  Skin:    Findings: No erythema, lesion or rash.  Neurological:     General: No focal deficit present.     Mental Status: She is alert and oriented to person, place, and time.     Sensory: Sensory deficit present.     Motor: No weakness or abnormal muscle tone.     Coordination: Coordination normal.     Gait: Gait normal.  Psychiatric:        Mood and Affect: Mood normal.        Behavior: Behavior normal.      Imaging: No results found.

## 2020-01-13 NOTE — Procedures (Signed)
S1 Lumbosacral Transforaminal Epidural Steroid Injection - Sub-Pedicular Approach with Fluoroscopic Guidance   Patient: Alicia Obrien      Date of Birth: [birth year] MRN: [id number] PCP: Felix Pacini, FNP      Visit Date: 12/30/2019   Universal Protocol:    Date/Time: 01/03/226:24 AM  Consent Given By: the patient  Position:  PRONE  Additional Comments: Vital signs were monitored before and after the procedure. Patient was prepped and draped in the usual sterile fashion. The correct patient, procedure, and site was verified.   Injection Procedure Details:  Procedure Site One Meds Administered:  Meds ordered this encounter  Medications  . betamethasone acetate-betamethasone sodium phosphate (CELESTONE) injection 12 mg    Laterality: Left  Location/Site:  S1 Foramen   Needle size: 22 ga.  Needle type: Spinal  Needle Placement: Transforaminal  Findings:   -Comments: Excellent flow of contrast along the nerve, nerve root and into the epidural space.   Procedure Details: After squaring off the sacral end-plate to get a true AP view, the C-arm was positioned so that the best possible view of the S1 foramen was visualized. The soft tissues overlying this structure were infiltrated with 2-3 ml. of 1% Lidocaine without Epinephrine.    The spinal needle was inserted toward the target using a "trajectory" view along the fluoroscope beam.  Under AP and lateral visualization, the needle was advanced so it did not puncture dura. Biplanar projections were used to confirm position. Aspiration was confirmed to be negative for CSF and/or blood. A 1-2 ml. volume of Isovue-250 was injected and flow of contrast was noted at each level. Radiographs were obtained for documentation purposes.   After attaining the desired flow of contrast documented above, a 0.5 to 1.0 ml test dose of 0.25% Marcaine was injected into each respective transforaminal space.  The patient was observed  for 90 seconds post injection.  After no sensory deficits were reported, and normal lower extremity motor function was noted,   the above injectate was administered so that equal amounts of the injectate were placed at each foramen (level) into the transforaminal epidural space.   Additional Comments:  The patient tolerated the procedure well Dressing: Band-Aid with 2 x 2 sterile gauze    Post-procedure details: Patient was observed during the procedure. Post-procedure instructions were reviewed.  Patient left the clinic in stable condition.

## 2020-02-08 DIAGNOSIS — Z20822 Contact with and (suspected) exposure to covid-19: Secondary | ICD-10-CM | POA: Diagnosis not present

## 2020-03-13 ENCOUNTER — Other Ambulatory Visit (HOSPITAL_COMMUNITY): Payer: Self-pay | Admitting: Endocrinology

## 2020-03-13 DIAGNOSIS — E7800 Pure hypercholesterolemia, unspecified: Secondary | ICD-10-CM | POA: Diagnosis not present

## 2020-03-13 DIAGNOSIS — I10 Essential (primary) hypertension: Secondary | ICD-10-CM | POA: Diagnosis not present

## 2020-03-13 DIAGNOSIS — R7303 Prediabetes: Secondary | ICD-10-CM | POA: Diagnosis not present

## 2020-03-13 DIAGNOSIS — E559 Vitamin D deficiency, unspecified: Secondary | ICD-10-CM | POA: Diagnosis not present

## 2020-03-13 DIAGNOSIS — Z79899 Other long term (current) drug therapy: Secondary | ICD-10-CM | POA: Diagnosis not present

## 2020-04-15 ENCOUNTER — Other Ambulatory Visit (HOSPITAL_COMMUNITY): Payer: Self-pay

## 2020-04-15 MED FILL — Hydrochlorothiazide Tab 12.5 MG: ORAL | 90 days supply | Qty: 90 | Fill #0 | Status: AC

## 2020-04-25 DIAGNOSIS — I10 Essential (primary) hypertension: Secondary | ICD-10-CM | POA: Diagnosis not present

## 2020-04-25 DIAGNOSIS — Z79899 Other long term (current) drug therapy: Secondary | ICD-10-CM | POA: Diagnosis not present

## 2020-05-06 ENCOUNTER — Encounter: Payer: Self-pay | Admitting: Internal Medicine

## 2020-05-06 ENCOUNTER — Other Ambulatory Visit (HOSPITAL_COMMUNITY): Payer: Self-pay

## 2020-05-06 ENCOUNTER — Ambulatory Visit (INDEPENDENT_AMBULATORY_CARE_PROVIDER_SITE_OTHER): Payer: 59 | Admitting: Internal Medicine

## 2020-05-06 VITALS — BP 120/72 | HR 101 | Temp 98.3°F | Ht 69.0 in | Wt 232.2 lb

## 2020-05-06 DIAGNOSIS — M7989 Other specified soft tissue disorders: Secondary | ICD-10-CM | POA: Insufficient documentation

## 2020-05-06 DIAGNOSIS — I10 Essential (primary) hypertension: Secondary | ICD-10-CM | POA: Diagnosis not present

## 2020-05-06 MED ORDER — LOSARTAN POTASSIUM 50 MG PO TABS
50.0000 mg | ORAL_TABLET | Freq: Every day | ORAL | 3 refills | Status: DC
  Filled 2020-05-06: qty 30, 30d supply, fill #0
  Filled 2020-05-26: qty 30, 30d supply, fill #1
  Filled 2020-07-04: qty 30, 30d supply, fill #2
  Filled 2020-08-03: qty 30, 30d supply, fill #3

## 2020-05-06 NOTE — Progress Notes (Signed)
   CC: leg swelling  HPI: Ms.Clarinda Obi is a 45 y.o. with PMH listed below presenting with complaint of leg swelling. Please see problem based assessment and plan for further details.  Past Medical History:  Diagnosis Date  . Elevated cholesterol   . Family history of breast cancer   . Family history of prostate cancer   . Heart murmur   . Hypertension   . Vertigo     Review of Systems: Review of Systems  Constitutional: Negative for chills, fever and malaise/fatigue.  Eyes: Negative for blurred vision.  Respiratory: Negative for shortness of breath.   Cardiovascular: Positive for leg swelling. Negative for chest pain, palpitations and orthopnea.  All other systems reviewed and are negative.    Physical Exam: Vitals:   05/06/20 0959 05/06/20 1037  BP: (!) 115/103 120/72  Pulse: (!) 101   Temp: 98.3 F (36.8 C)   TempSrc: Oral   SpO2: 100%   Weight: 232 lb 3.2 oz (105.3 kg)   Height: 5\' 9"  (1.753 m)    Gen: Well-developed, well nourished, NAD HEENT: NCAT head, hearing intact CV: RRR, S1, S2 normal Pulm: CTAB, No rales, no wheezes Extm: ROM intact, Peripheral pulses intact, Trace ankle edema Skin: Dry, Warm  Assessment & Plan:   Swelling of lower extremity Ms.Samone Guhl is a 45 yo F w/ PMH of HTN presenting to [facility] w/ complaint of lower extremity swelling. She mentions that she has had intermittent episodes of lower extremity swelling associated with her anti-hypertensive therapy. She has been instructed to up-titrate her amlodipine by her PCP and had increased her dose to 15mg  and noticed significant swelling of her lower extremities that is painful. She denies any significant dyspnea, chest pain. She wears compression stockings which has helped in the past but is not as helpful.  A/P Present w/ lower extremity edema. Mild ankle edema. No tenderness. No evidence of infection or risk factors for heart failure / dvt. Likely due to side effect of amlodipine.  Will d/c amlodipine and offer alternative therapies - D/c amlodipine - C/w compression stockings  Essential hypertension BP Readings from Last 3 Encounters:  05/06/20 120/72  12/30/19 126/85  11/26/19 126/85   At goal but maintains significant side effect burden with lower extremity swelling. Denies dyspnea, cough, chest pain, palpitations.  A/P Can d/c amlodipine as unable to tolerate side effects. Will start ARB as alterntaive therapy. - D/c amlodipine - C/w HCTZ - Start losartan 50mg  daily - F/u w/ PCP in 4 weeks for bmp    Patient discussed with Dr. Jimmye Norman  -Gilberto Better, [facility] Pager: [phone]

## 2020-05-06 NOTE — Assessment & Plan Note (Addendum)
BP Readings from Last 3 Encounters:  05/06/20 120/72  12/30/19 126/85  11/26/19 126/85   At goal but maintains significant side effect burden with lower extremity swelling. Denies dyspnea, cough, chest pain, palpitations.  A/P Can d/c amlodipine as unable to tolerate side effects. Will start ARB as alterntaive therapy. - D/c amlodipine - C/w HCTZ - Start losartan 50mg  daily - F/u w/ PCP in 4 weeks for bmp

## 2020-05-06 NOTE — Assessment & Plan Note (Addendum)
[name] is a 45 yo F w/ PMH of HTN presenting to [facility] w/ complaint of lower extremity swelling. She mentions that she has had intermittent episodes of lower extremity swelling associated with her anti-hypertensive therapy. She has been instructed to up-titrate her amlodipine by her PCP and had increased her dose to 15mg  and noticed significant swelling of her lower extremities that is painful. She denies any significant dyspnea, chest pain. She wears compression stockings which has helped in the past but is not as helpful.  A/P Present w/ lower extremity edema. Mild ankle edema. No tenderness. No evidence of infection or risk factors for heart failure / dvt. Likely due to side effect of amlodipine. Will d/c amlodipine and offer alternative therapies - D/c amlodipine - C/w compression stockings

## 2020-05-13 NOTE — Progress Notes (Signed)
Internal Medicine Clinic Attending  Case discussed with Dr. Lee  At the time of the visit.  We reviewed the resident's history and exam and pertinent patient test results.  I agree with the assessment, diagnosis, and plan of care documented in the resident's note.    

## 2020-05-15 ENCOUNTER — Inpatient Hospital Stay: Admission: RE | Admit: 2020-05-15 | Payer: 59 | Source: Ambulatory Visit

## 2020-05-15 ENCOUNTER — Other Ambulatory Visit: Payer: 59

## 2020-05-26 ENCOUNTER — Other Ambulatory Visit (HOSPITAL_COMMUNITY): Payer: Self-pay

## 2020-05-27 ENCOUNTER — Other Ambulatory Visit (HOSPITAL_COMMUNITY): Payer: Self-pay

## 2020-05-27 MED ORDER — HYDROCHLOROTHIAZIDE 12.5 MG PO TABS
12.5000 mg | ORAL_TABLET | Freq: Every day | ORAL | 1 refills | Status: DC
  Filled 2020-05-27 – 2020-05-29 (×3): qty 90, 90d supply, fill #0
  Filled 2020-07-25: qty 90, 90d supply, fill #1

## 2020-05-28 ENCOUNTER — Other Ambulatory Visit (HOSPITAL_COMMUNITY): Payer: Self-pay

## 2020-05-29 ENCOUNTER — Other Ambulatory Visit (HOSPITAL_COMMUNITY): Payer: Self-pay

## 2020-05-30 ENCOUNTER — Ambulatory Visit
Admission: RE | Admit: 2020-05-30 | Discharge: 2020-05-30 | Disposition: A | Discharge status: Home / Self Care | Payer: 59 | Source: Ambulatory Visit | Attending: Obstetrics and Gynecology | Admitting: Obstetrics and Gynecology

## 2020-05-30 DIAGNOSIS — Z9189 Other specified personal risk factors, not elsewhere classified: Secondary | ICD-10-CM

## 2020-05-30 DIAGNOSIS — R928 Other abnormal and inconclusive findings on diagnostic imaging of breast: Secondary | ICD-10-CM | POA: Diagnosis not present

## 2020-05-30 DIAGNOSIS — Z1239 Encounter for other screening for malignant neoplasm of breast: Secondary | ICD-10-CM | POA: Diagnosis not present

## 2020-05-30 DIAGNOSIS — Z803 Family history of malignant neoplasm of breast: Secondary | ICD-10-CM

## 2020-05-30 MED ORDER — GADOBUTROL 1 MMOL/ML IV SOLN
10.0000 mL | Freq: Once | INTRAVENOUS | Status: AC | PRN
  Administered 2020-05-30: 10 mL via INTRAVENOUS

## 2020-06-01 ENCOUNTER — Telehealth: Payer: Self-pay

## 2020-06-01 ENCOUNTER — Other Ambulatory Visit (HOSPITAL_COMMUNITY): Payer: Self-pay

## 2020-06-01 ENCOUNTER — Other Ambulatory Visit: Payer: Self-pay | Admitting: Obstetrics and Gynecology

## 2020-06-01 ENCOUNTER — Telehealth: Payer: Self-pay | Admitting: *Deleted

## 2020-06-01 ENCOUNTER — Other Ambulatory Visit: Payer: Self-pay

## 2020-06-01 DIAGNOSIS — R9389 Abnormal findings on diagnostic imaging of other specified body structures: Secondary | ICD-10-CM

## 2020-06-01 MED FILL — Ergocalciferol Cap 1.25 MG (50000 Unit): ORAL | 28 days supply | Qty: 4 | Fill #0 | Status: AC

## 2020-06-01 MED FILL — Meclizine HCl Tab 12.5 MG: ORAL | Qty: 60 | Fill #0 | Status: CN

## 2020-06-01 NOTE — Telephone Encounter (Signed)
-----   Message from [name], MD sent at 06/01/2020  2:01 PM EDT ----- Please arrange for the recommended biopsy and let the patient know.

## 2020-06-01 NOTE — Telephone Encounter (Signed)
Patient called stating she was calling about the result in My Chart from her MRI.  She said she is scheduled for a breast biopsy on Thursday 05/25/20 morning at 7:10am and if you would call her that day she said that would be best because she will be off work that day.

## 2020-06-01 NOTE — Telephone Encounter (Signed)
Opened in error; wrong patient 

## 2020-06-01 NOTE — Telephone Encounter (Signed)
I called [facility] and spoke with Langley Gauss and was informed that they normally contact the patient to schedule biopsy results once patient has been informed by provider. Langley Gauss said she will placed the orders but also schedule patient on 06/04/20 @ 6:50am at [address] for biopsy, once biospy completed she will then go to the breast center for imaging for the clip. She will need to be NPO 4 hours prior to biopsy and okay to take medication that am with only a sip of water.   Left message for patient to call to relay all this.

## 2020-06-02 NOTE — Telephone Encounter (Signed)
Patient informed with all the below note.

## 2020-06-03 ENCOUNTER — Other Ambulatory Visit (HOSPITAL_COMMUNITY): Payer: Self-pay

## 2020-06-04 ENCOUNTER — Other Ambulatory Visit: Payer: Self-pay

## 2020-06-04 ENCOUNTER — Other Ambulatory Visit (HOSPITAL_COMMUNITY): Payer: Self-pay

## 2020-06-04 ENCOUNTER — Other Ambulatory Visit: Payer: Self-pay | Admitting: Radiology

## 2020-06-04 ENCOUNTER — Ambulatory Visit
Admission: RE | Admit: 2020-06-04 | Discharge: 2020-06-04 | Disposition: A | Discharge status: Home / Self Care | Payer: 59 | Source: Ambulatory Visit | Attending: Obstetrics and Gynecology | Admitting: Obstetrics and Gynecology

## 2020-06-04 DIAGNOSIS — R9389 Abnormal findings on diagnostic imaging of other specified body structures: Secondary | ICD-10-CM

## 2020-06-04 DIAGNOSIS — Z114 Encounter for screening for human immunodeficiency virus [HIV]: Secondary | ICD-10-CM | POA: Diagnosis not present

## 2020-06-04 DIAGNOSIS — Z1331 Encounter for screening for depression: Secondary | ICD-10-CM | POA: Diagnosis not present

## 2020-06-04 DIAGNOSIS — R928 Other abnormal and inconclusive findings on diagnostic imaging of breast: Secondary | ICD-10-CM | POA: Diagnosis not present

## 2020-06-04 DIAGNOSIS — R5383 Other fatigue: Secondary | ICD-10-CM | POA: Diagnosis not present

## 2020-06-04 DIAGNOSIS — Z1339 Encounter for screening examination for other mental health and behavioral disorders: Secondary | ICD-10-CM | POA: Diagnosis not present

## 2020-06-04 DIAGNOSIS — E559 Vitamin D deficiency, unspecified: Secondary | ICD-10-CM | POA: Diagnosis not present

## 2020-06-04 DIAGNOSIS — Z131 Encounter for screening for diabetes mellitus: Secondary | ICD-10-CM | POA: Diagnosis not present

## 2020-06-04 DIAGNOSIS — R0602 Shortness of breath: Secondary | ICD-10-CM | POA: Diagnosis not present

## 2020-06-04 DIAGNOSIS — Z20822 Contact with and (suspected) exposure to covid-19: Secondary | ICD-10-CM | POA: Diagnosis not present

## 2020-06-04 DIAGNOSIS — Z0000 Encounter for general adult medical examination without abnormal findings: Secondary | ICD-10-CM | POA: Diagnosis not present

## 2020-06-04 DIAGNOSIS — I10 Essential (primary) hypertension: Secondary | ICD-10-CM | POA: Diagnosis not present

## 2020-06-04 DIAGNOSIS — Z7251 High risk heterosexual behavior: Secondary | ICD-10-CM | POA: Diagnosis not present

## 2020-06-04 DIAGNOSIS — Z13220 Encounter for screening for lipoid disorders: Secondary | ICD-10-CM | POA: Diagnosis not present

## 2020-06-04 MED ORDER — FLUCONAZOLE 150 MG PO TABS
150.0000 mg | ORAL_TABLET | ORAL | 0 refills | Status: AC
  Filled 2020-06-04: qty 3, 6d supply, fill #0

## 2020-06-04 MED ORDER — GADOBUTROL 1 MMOL/ML IV SOLN: 10.0000 mL | Freq: Once | INTRAVENOUS | Status: DC | PRN

## 2020-06-04 MED ORDER — ROSUVASTATIN CALCIUM 20 MG PO TABS
20.0000 mg | ORAL_TABLET | Freq: Every day | ORAL | 1 refills | Status: DC
  Filled 2020-06-04: qty 90, 90d supply, fill #0
  Filled 2020-09-01: qty 90, 90d supply, fill #1

## 2020-06-04 NOTE — Telephone Encounter (Signed)
Spoke with the patient, she is sore after her breast biopsy today. Results should be back on Tuesday

## 2020-06-09 ENCOUNTER — Other Ambulatory Visit (HOSPITAL_COMMUNITY): Payer: Self-pay

## 2020-06-09 MED ORDER — METRONIDAZOLE 500 MG PO TABS
500.0000 mg | ORAL_TABLET | Freq: Two times a day (BID) | ORAL | 0 refills | Status: DC
  Filled 2020-06-09: qty 14, 7d supply, fill #0

## 2020-06-15 ENCOUNTER — Telehealth: Payer: Self-pay | Admitting: Physical Medicine and Rehabilitation

## 2020-06-15 NOTE — Telephone Encounter (Signed)
PT called and left ankle is very weak and she would like to be seen for it. CB [phone]. Leave a detailed message. Pt also states she has MyChart open so you could ask message her on there!

## 2020-06-15 NOTE — Telephone Encounter (Signed)
Left S1 TF 12/30/2019. Please advise.

## 2020-06-16 NOTE — Telephone Encounter (Signed)
Left message #1

## 2020-06-20 DIAGNOSIS — E119 Type 2 diabetes mellitus without complications: Secondary | ICD-10-CM | POA: Diagnosis not present

## 2020-06-20 DIAGNOSIS — E876 Hypokalemia: Secondary | ICD-10-CM | POA: Diagnosis not present

## 2020-06-20 DIAGNOSIS — E7800 Pure hypercholesterolemia, unspecified: Secondary | ICD-10-CM | POA: Diagnosis not present

## 2020-06-20 DIAGNOSIS — I10 Essential (primary) hypertension: Secondary | ICD-10-CM | POA: Diagnosis not present

## 2020-06-22 ENCOUNTER — Other Ambulatory Visit (HOSPITAL_COMMUNITY): Payer: Self-pay

## 2020-06-22 MED ORDER — VITAMIN D (ERGOCALCIFEROL) 1.25 MG (50000 UNIT) PO CAPS
50000.0000 [IU] | ORAL_CAPSULE | ORAL | 5 refills | Status: DC
  Filled 2020-06-22 – 2020-07-04 (×2): qty 4, 28d supply, fill #0
  Filled 2020-07-25: qty 4, 28d supply, fill #1

## 2020-06-23 ENCOUNTER — Other Ambulatory Visit (HOSPITAL_COMMUNITY): Payer: Self-pay

## 2020-06-25 ENCOUNTER — Other Ambulatory Visit: Payer: Self-pay

## 2020-06-25 ENCOUNTER — Ambulatory Visit (INDEPENDENT_AMBULATORY_CARE_PROVIDER_SITE_OTHER): Payer: 59 | Admitting: Student

## 2020-06-25 ENCOUNTER — Ambulatory Visit (HOSPITAL_COMMUNITY)
Admission: RE | Admit: 2020-06-25 | Discharge: 2020-06-25 | Disposition: A | Discharge status: Home / Self Care | Payer: 59 | Source: Ambulatory Visit | Attending: Internal Medicine | Admitting: Internal Medicine

## 2020-06-25 VITALS — BP 135/81 | HR 85 | Temp 98.1°F | Ht 69.0 in | Wt 231.0 lb

## 2020-06-25 DIAGNOSIS — M25522 Pain in left elbow: Secondary | ICD-10-CM | POA: Insufficient documentation

## 2020-06-25 DIAGNOSIS — R600 Localized edema: Secondary | ICD-10-CM | POA: Diagnosis not present

## 2020-06-25 NOTE — Progress Notes (Signed)
Office Visit   Patient ID: Alicia Obrien, female    DOB: 1975/06/05, 45 y.o.   MRN: [id number]   PCP: Beverley Fiedler, FNP   Subjective:   CC: left elbow pain  HPI:  Alicia Obrien is a 45 y.o. woman with history as below who presents to clinic for acute left elbow pain. Her last clinic visit was on 05/06/20.   To see the details of this patient's management of their acute and chronic problems, please refer to the Assessment & Plan under the Encounters tab.   Review of Systems:   Review of Systems  Constitutional:  Negative for chills, fever and malaise/fatigue.  Respiratory:  Negative for shortness of breath.   Cardiovascular:  Negative for chest pain.  Gastrointestinal:  Negative for abdominal pain.  Musculoskeletal:  Positive for joint pain.  Skin:  Negative for rash.  Neurological:  Negative for dizziness and headaches.   Past Medical History:  Diagnosis Date   Elevated cholesterol    Family history of breast cancer    Family history of prostate cancer    Heart murmur    Hypertension    Vertigo       ACTIVE MEDICATIONS   Outpatient Medications Prior to Visit  Medication Sig Dispense Refill   atorvastatin (LIPITOR) 20 MG tablet Take 20 mg by mouth daily.     cetirizine (ZYRTEC) 10 MG tablet Take 10 mg by mouth daily.      docusate sodium (COLACE) 100 MG capsule TAKE 1 CAPSULE BY MOUTH TWICE A DAY 30 capsule 0   doxycycline (VIBRA-TABS) 100 MG tablet Take 1 tablet (100 mg total) by mouth 2 (two) times daily. 14 tablet 0   hydrochlorothiazide (HYDRODIURIL) 12.5 MG tablet Take 1 tablet by mouth daily 90 tablet 1   hydrochlorothiazide (MICROZIDE) 12.5 MG capsule Take 12.5 mg by mouth daily.     losartan (COZAAR) 50 MG tablet Take 1 tablet (50 mg total) by mouth daily. 30 tablet 3   meclizine (ANTIVERT) 12.5 MG tablet TAKE 1 TABLET BY MOUTH TWICE DAILY AS NEEDED. 60 tablet 2   methocarbamol (ROBAXIN) 500 MG tablet Take 500 mg by mouth 3 (three)  times daily.     metroNIDAZOLE (FLAGYL) 500 MG tablet Take 1 tablet (500 mg total) by mouth 2 (two) times daily. 14 tablet 0   naproxen (NAPROSYN) 500 MG tablet TAKE 1 TABLET BY MOUTH 2 TIMES DAILY WITH MEAL 60 tablet 0   rosuvastatin (CRESTOR) 20 MG tablet Take 1 tablet (20 mg total) by mouth daily. 90 tablet 1   Vitamin D, Ergocalciferol, (DRISDOL) 1.25 MG (50000 UNIT) CAPS capsule TAKE 1 (ONE) CAPSULE BY MOUTH WEEKLY 4 capsule 5   Vitamin D, Ergocalciferol, (DRISDOL) 1.25 MG (50000 UNIT) CAPS capsule Take 1 capsule (50,000 Units total) by mouth once a week. 4 capsule 5   Facility-Administered Medications Prior to Visit  Medication Dose Route Frequency Provider Last Rate Last Admin   betamethasone acetate-betamethasone sodium phosphate (CELESTONE) injection 12 mg  12 mg Other Once Magnus Sinning, MD       Objective:   BP 135/81 (BP Location: Right Arm, Patient Position: Sitting, Cuff Size: Normal)   Pulse 85   Temp 98.1 F (36.7 C) (Oral)   Ht 5\' 9"  (1.753 m)   Wt 231 lb (104.8 kg)   LMP 09/09/2019   SpO2 100%   BMI 34.11 kg/m  Wt Readings from Last 3 Encounters:  06/25/20 231 lb (104.8 kg)  05/06/20  232 lb 3.2 oz (105.3 kg)  11/19/19 224 lb (101.6 kg)   BP Readings from Last 3 Encounters:  06/25/20 135/81  05/06/20 120/72  12/30/19 126/85   Constitutional: well-appearing woman sitting in chair, in mild distress from elbow pain HENT: normocephalic atraumatic, mucous membranes moist Eyes: conjunctiva non-erythematous Neck: supple Pulmonary/Chest: normal work of breathing on room air Abdominal: non-distended MSK: normal bulk and tone; left elbow extended and arm down by her side; pain with palpation 2 inches above and below elbow joint; no overlying erythema or warmth, there is mild swelling of the posterior elbow; ROM very limited secondary to pain, radial pulses 2+ bilaterally Neurological: alert & oriented x 3, normal gait Skin: warm and dry, no rashes Psych: normal  mood and affect  Health Maintenance:   Health Maintenance  Topic Date Due   COVID-19 Vaccine (1) Never done   Pneumococcal Vaccine 40-54 Years old (1 - PCV) Never done   Hepatitis C Screening  Never done   PAP SMEAR-Modifier  Never done   TETANUS/TDAP  06/08/2026   HIV Screening  Completed   HPV VACCINES  Aged Out    Assessment & Plan:   Problem List Items Addressed This Visit       Other   Left elbow pain - Primary    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 She reports she originally developed mild L elbow pain ~3 weeks ago while she was watching TV with her daughter. She states she heard a "pop" and felt a twinge of pain in her elbow, which was just resting bent at her side. She looked at the skin and saw a bruise which quickly expanded to the size of her palm within a couple of hours. Since then, she has had pain at night and occasionally during the day at work. She states she has not been taking any pain medicine, OTC or otherwise, because she has been managing ok. However, a couple of hours ago while at work, she had acute onset of severe pain, 8/10, and has not been able to move her elbow significantly. She is most comfortable with the arm extended at her side. She denies recent fevers, chills, headaches, rashes, other joint pains. She states she otherwise feels fine. No preceding trauma to the area.  Her vitals are stable, she is afebrile. On exam, left elbow extended and arm down by her side; pain with palpation 2 inches above and below elbow joint; no overlying erythema or warmth, there is mild swelling of the posterior elbow; ROM very limited secondary to pain, radial pulses 2+ bilaterally.  Assessment/Plan: Given the acute onset of the pain and patient's limited range of motion, will obtain STAT elbow xray to evaluate for bony abnormalities, large effusion. Suspect patient may have olecranon bursitis, though swelling is not significant. - L  elbow xray --> IMPRESSION: There is no evidence of fracture, dislocation, or joint effusion. There is no evidence of arthropathy or other focal bone abnormality. Mild edema within the subcutaneous fat of the posterior elbow and proximal forearm - Advised patient to apply heat and use ibuprofen or other anti-inflammatory. Could consider orthosis, however also would not want to impede range of motion. If these treatments do not improve her symptoms, would consider alternative etiologies such as some sort of arthropathy such as arthritis or gout. - RTC PRN       Relevant Orders   DG Elbow Complete Left (Completed)  Return if symptoms worsen or fail to improve.   Pt discussed with Dr. Philipp Ovens.  Alexandria Lodge, MD Internal Medicine Resident, PGY-1 [facility] Pager: [phone] 3:45 PM, 06/26/2020

## 2020-06-26 DIAGNOSIS — M25522 Pain in left elbow: Secondary | ICD-10-CM | POA: Insufficient documentation

## 2020-06-26 NOTE — Assessment & Plan Note (Signed)
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 She reports she originally developed mild L elbow pain ~3 weeks ago while she was watching TV with her daughter. She states she heard a "pop" and felt a twinge of pain in her elbow, which was just resting bent at her side. She looked at the skin and saw a bruise which quickly expanded to the size of her palm within a couple of hours. Since then, she has had pain at night and occasionally during the day at work. She states she has not been taking any pain medicine, OTC or otherwise, because she has been managing ok. However, a couple of hours ago while at work, she had acute onset of severe pain, 8/10, and has not been able to move her elbow significantly. She is most comfortable with the arm extended at her side. She denies recent fevers, chills, headaches, rashes, other joint pains. She states she otherwise feels fine. No preceding trauma to the area.  Her vitals are stable, she is afebrile. On exam, left elbow extended and arm down by her side; pain with palpation 2 inches above and below elbow joint; no overlying erythema or warmth, there is mild swelling of the posterior elbow; ROM very limited secondary to pain, radial pulses 2+ bilaterally.  Assessment/Plan: Given the acute onset of the pain and patient's limited range of motion, will obtain STAT elbow xray to evaluate for bony abnormalities, large effusion. Suspect patient may have olecranon bursitis, though swelling is not significant. - L elbow xray --> IMPRESSION: There is no evidence of fracture, dislocation, or joint effusion. There is no evidence of arthropathy or other focal bone abnormality. Mild edema within the subcutaneous fat of the posterior elbow and proximal forearm - Advised patient to apply heat and use ibuprofen or other anti-inflammatory. Could consider orthosis, however also would not want to impede range of motion. If these treatments do not improve her  symptoms, would consider alternative etiologies such as some sort of arthropathy such as arthritis or gout. - RTC PRN

## 2020-06-30 ENCOUNTER — Ambulatory Visit: Payer: 59 | Admitting: Physical Medicine and Rehabilitation

## 2020-06-30 NOTE — Progress Notes (Signed)
Internal Medicine Clinic Attending  I saw and evaluated the patient.  I personally confirmed the key portions of the history and exam documented by Dr. Watson and I reviewed pertinent patient test results.  The assessment, diagnosis, and plan were formulated together and I agree with the documentation in the resident's note.  

## 2020-07-01 ENCOUNTER — Other Ambulatory Visit (HOSPITAL_COMMUNITY): Payer: Self-pay

## 2020-07-06 ENCOUNTER — Other Ambulatory Visit (HOSPITAL_COMMUNITY): Payer: Self-pay

## 2020-07-10 ENCOUNTER — Other Ambulatory Visit (HOSPITAL_COMMUNITY): Payer: Self-pay

## 2020-07-10 DIAGNOSIS — M7712 Lateral epicondylitis, left elbow: Secondary | ICD-10-CM | POA: Diagnosis not present

## 2020-07-10 MED ORDER — MELOXICAM 7.5 MG PO TABS
7.5000 mg | ORAL_TABLET | Freq: Two times a day (BID) | ORAL | 0 refills | Status: DC
  Filled 2020-07-10: qty 60, 30d supply, fill #0

## 2020-07-17 DIAGNOSIS — N898 Other specified noninflammatory disorders of vagina: Secondary | ICD-10-CM | POA: Diagnosis not present

## 2020-07-17 DIAGNOSIS — M7712 Lateral epicondylitis, left elbow: Secondary | ICD-10-CM | POA: Diagnosis not present

## 2020-07-17 DIAGNOSIS — R300 Dysuria: Secondary | ICD-10-CM | POA: Diagnosis not present

## 2020-07-17 DIAGNOSIS — R030 Elevated blood-pressure reading, without diagnosis of hypertension: Secondary | ICD-10-CM | POA: Diagnosis not present

## 2020-07-20 DIAGNOSIS — Z20822 Contact with and (suspected) exposure to covid-19: Secondary | ICD-10-CM | POA: Diagnosis not present

## 2020-07-23 ENCOUNTER — Ambulatory Visit: Payer: BC Managed Care – PPO | Admitting: Obstetrics and Gynecology

## 2020-07-23 ENCOUNTER — Other Ambulatory Visit (HOSPITAL_COMMUNITY): Payer: Self-pay

## 2020-07-23 MED ORDER — AMOXICILLIN 500 MG PO CAPS
500.0000 mg | ORAL_CAPSULE | Freq: Three times a day (TID) | ORAL | 0 refills | Status: DC
  Filled 2020-07-23 – 2020-07-31 (×2): qty 21, 7d supply, fill #0

## 2020-07-27 ENCOUNTER — Other Ambulatory Visit (HOSPITAL_COMMUNITY): Payer: Self-pay

## 2020-07-28 ENCOUNTER — Other Ambulatory Visit (HOSPITAL_COMMUNITY): Payer: Self-pay

## 2020-07-30 ENCOUNTER — Other Ambulatory Visit (HOSPITAL_COMMUNITY): Payer: Self-pay

## 2020-07-31 ENCOUNTER — Other Ambulatory Visit (HOSPITAL_COMMUNITY): Payer: Self-pay

## 2020-08-03 ENCOUNTER — Other Ambulatory Visit (HOSPITAL_COMMUNITY): Payer: Self-pay

## 2020-08-06 NOTE — Progress Notes (Signed)
45 y.o. [id number] Single Unavailable Not Hispanic or Latino female here for annual exam.  Patient states that she keeps getting different types of vaganitis  She had a TLH/BS last year for AUB and pelvic pain. Doing well, all her pain is gone.  No bowel or bladder c/o.  Her primary has treated her for vaginitis.  No current symptoms.  Sexually active, same partner x 2 years. No dyspareunia.  Getting married in 4/23!  She was diagnosed with DM in the last year. She is trying to control it with diet and exercise.     Patient's last menstrual period was 09/09/2019.          Sexually active: Yes.    The current method of family planning is status post hysterectomy.    Exercising: Yes.     Walking  Smoker:  no  Health Maintenance: Pap:   06/02/19 normal  History of abnormal Pap:  yes, f/u was okay.  MMG:  06/15/20 Rt Breast BX path revealed benign breast parenchyma with no specific histopathologic changes. BMD:   none  Colonoscopy: none  TDaP:  06/07/16  Gardasil: none    reports that she has never smoked. She has never used smokeless tobacco. She reports that she does not drink alcohol and does not use drugs. 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   Past Medical History:  Diagnosis Date   Elevated cholesterol    Family history of breast cancer    Family history of prostate cancer    Heart murmur    Hypertension    Vertigo     Past Surgical History:  Procedure Laterality Date   CYSTOSCOPY N/A 09/17/2019   Procedure: CYSTOSCOPY;  Surgeon: [name], MD;  Location: [facility];  Service: Gynecology;  Laterality: N/A;   NO PAST SURGERIES     TOTAL LAPAROSCOPIC HYSTERECTOMY WITH SALPINGECTOMY N/A 09/17/2019   Procedure: TOTAL LAPAROSCOPIC HYSTERECTOMY, BILATERAL SALPINGECTOMY;  Surgeon: [name], MD;  Location: [facility];  Service: Gynecology;  Laterality: N/A;    Current Outpatient Medications  Medication Sig  Dispense Refill   amoxicillin (AMOXIL) 500 MG capsule Take 1 capsule (500 mg total) by mouth 3 (three) times daily. 21 capsule 0   cetirizine (ZYRTEC) 10 MG tablet Take 10 mg by mouth daily.      hydrochlorothiazide (HYDRODIURIL) 12.5 MG tablet Take 1 tablet by mouth daily 90 tablet 1   losartan (COZAAR) 50 MG tablet Take 1 tablet (50 mg total) by mouth daily. 30 tablet 3   meloxicam (MOBIC) 7.5 MG tablet Take 1 tablet (7.5 mg total) by mouth 1-2 times daily. 60 tablet 0   rosuvastatin (CRESTOR) 20 MG tablet Take 1 tablet (20 mg total) by mouth daily. 90 tablet 1   Vitamin D, Ergocalciferol, (DRISDOL) 1.25 MG (50000 UNIT) CAPS capsule TAKE 1 (ONE) CAPSULE BY MOUTH WEEKLY 4 capsule 5   meclizine (ANTIVERT) 12.5 MG tablet TAKE 1 TABLET BY MOUTH TWICE DAILY AS NEEDED. 60 tablet 2   Current Facility-Administered Medications  Medication Dose Route Frequency Provider Last Rate Last Admin   betamethasone acetate-betamethasone sodium phosphate (CELESTONE) injection 12 mg  12 mg Other Once Magnus Sinning, MD        Family History  Problem Relation Age of Onset   Diabetes Mother    Cancer Maternal Uncle        bone cancer    Heart disease Maternal Uncle  Dementia Maternal Grandmother    Breast cancer Paternal Grandmother 53       d. in her 88s   Breast cancer Cousin 95       mother's maternal first cousin   Breast cancer Paternal Aunt        dx early 56s   Breast cancer Maternal Great-grandmother    Heart attack Father    Prostate cancer Paternal Uncle 63       dx 2nd time in his 28s   Prostate cancer Maternal Uncle 27   Breast cancer Other        PGMs sister dx in her 8s   Other Paternal Aunt        has breast lumps, watching her closely    Review of Systems  All other systems reviewed and are negative.  Exam:   BP 122/80   Pulse 76   Ht 5' 8.5" (1.74 m)   Wt 219 lb (99.3 kg)   LMP 09/09/2019   SpO2 98%   BMI 32.81 kg/m   Weight change: '@WEIGHTCHANGE'$ @ Height:   Height:  5' 8.5" (174 cm)  Ht Readings from Last 3 Encounters:  08/07/20 5' 8.5" (1.74 m)  06/25/20 '5\' 9"'$  (1.753 m)  05/06/20 '5\' 9"'$  (1.753 m)    General appearance: alert, cooperative and appears stated age Head: Normocephalic, without obvious abnormality, atraumatic Neck: no adenopathy, supple, symmetrical, trachea midline and thyroid normal to inspection and palpation Lungs: clear to auscultation bilaterally Cardiovascular: regular rate and rhythm Breasts: normal appearance, no masses or tenderness Abdomen: soft, non-tender; non distended,  no masses,  no organomegaly Extremities: extremities normal, atraumatic, no cyanosis or edema Skin: Skin color, texture, turgor normal. No rashes or lesions Lymph nodes: Cervical, supraclavicular, and axillary nodes normal. No abnormal inguinal nodes palpated Neurologic: Grossly normal   Pelvic: External genitalia:  no lesions              Urethra:  normal appearing urethra with no masses, tenderness or lesions              Bartholins and Skenes: normal                 Vagina: normal appearing vagina with normal color and discharge, no lesions              Cervix: absent               Bimanual Exam:  Uterus:  uterus absent              Adnexa: no mass, fullness, tenderness               Rectovaginal: Confirms               Anus:  normal sphincter tone, no lesions  Royal Hawthorn, CMA chaperoned for the exam.  1. Well woman exam Discussed breast self exam Discussed calcium and vit D intake No pap needed Labs with primary  2. S/P laparoscopic hysterectomy Doing well  3. Increased risk of breast cancer Just had a MRI and biopsy in 5/22 Due for f/u MRI in 6 months Due for mammogram now

## 2020-08-07 ENCOUNTER — Other Ambulatory Visit: Payer: Self-pay

## 2020-08-07 ENCOUNTER — Ambulatory Visit (INDEPENDENT_AMBULATORY_CARE_PROVIDER_SITE_OTHER): Payer: 59 | Admitting: Obstetrics and Gynecology

## 2020-08-07 ENCOUNTER — Encounter: Payer: Self-pay | Admitting: Obstetrics and Gynecology

## 2020-08-07 VITALS — BP 122/80 | HR 76 | Ht 68.5 in | Wt 219.0 lb

## 2020-08-07 DIAGNOSIS — Z90710 Acquired absence of both cervix and uterus: Secondary | ICD-10-CM | POA: Diagnosis not present

## 2020-08-07 DIAGNOSIS — Z01419 Encounter for gynecological examination (general) (routine) without abnormal findings: Secondary | ICD-10-CM | POA: Diagnosis not present

## 2020-08-07 DIAGNOSIS — Z9189 Other specified personal risk factors, not elsewhere classified: Secondary | ICD-10-CM | POA: Diagnosis not present

## 2020-08-07 NOTE — Patient Instructions (Signed)

## 2020-08-12 ENCOUNTER — Other Ambulatory Visit: Payer: Self-pay | Admitting: Obstetrics and Gynecology

## 2020-08-12 DIAGNOSIS — Z803 Family history of malignant neoplasm of breast: Secondary | ICD-10-CM

## 2020-08-12 DIAGNOSIS — Z1231 Encounter for screening mammogram for malignant neoplasm of breast: Secondary | ICD-10-CM

## 2020-08-21 ENCOUNTER — Inpatient Hospital Stay: Admission: RE | Admit: 2020-08-21 | Payer: 59 | Source: Ambulatory Visit

## 2020-08-27 ENCOUNTER — Ambulatory Visit: Payer: 59 | Admitting: Sports Medicine

## 2020-09-01 ENCOUNTER — Other Ambulatory Visit (HOSPITAL_COMMUNITY): Payer: Self-pay

## 2020-09-01 ENCOUNTER — Other Ambulatory Visit: Payer: Self-pay

## 2020-09-01 MED FILL — Ergocalciferol Cap 1.25 MG (50000 Unit): ORAL | 28 days supply | Qty: 4 | Fill #1 | Status: AC

## 2020-09-02 ENCOUNTER — Other Ambulatory Visit (HOSPITAL_COMMUNITY): Payer: Self-pay

## 2020-09-02 MED ORDER — LOSARTAN POTASSIUM 50 MG PO TABS
50.0000 mg | ORAL_TABLET | Freq: Every day | ORAL | 0 refills | Status: DC
  Filled 2020-09-02: qty 90, 90d supply, fill #0

## 2020-09-05 ENCOUNTER — Ambulatory Visit
Admission: RE | Admit: 2020-09-05 | Discharge: 2020-09-05 | Disposition: A | Discharge status: Home / Self Care | Payer: 59 | Source: Ambulatory Visit | Attending: Obstetrics and Gynecology | Admitting: Obstetrics and Gynecology

## 2020-09-05 DIAGNOSIS — Z1231 Encounter for screening mammogram for malignant neoplasm of breast: Secondary | ICD-10-CM

## 2020-09-07 ENCOUNTER — Ambulatory Visit (INDEPENDENT_AMBULATORY_CARE_PROVIDER_SITE_OTHER): Payer: 59 | Admitting: Family Medicine

## 2020-09-07 ENCOUNTER — Other Ambulatory Visit: Payer: Self-pay

## 2020-09-07 VITALS — Ht 68.0 in | Wt 219.0 lb

## 2020-09-07 DIAGNOSIS — M25522 Pain in left elbow: Secondary | ICD-10-CM | POA: Diagnosis not present

## 2020-09-07 NOTE — Progress Notes (Signed)
PCP: Beverley Fiedler, FNP  Subjective:   HPI: Patient is a 45 y.o. female here for left elbow pain.  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 She reports she originally developed mild L elbow pain ~3 weeks ago while she was watching TV with her daughter. She states she heard a "pop" and felt a twinge of pain in her elbow, which was just resting bent at her side. She looked at the skin and saw a bruise which quickly expanded to the size of her palm within a couple of hours. Since then, she has had pain at night and occasionally during the day at work. She states she has not been taking any pain medicine, OTC or otherwise, because she has been managing ok. However, a couple of hours ago while at work, she had acute onset of severe pain, 8/10, and has not been able to move her elbow significantly. She is most comfortable with the arm extended at her side. She denies recent fevers, chills, headaches, rashes, other joint pains. She states she otherwise feels fine. No preceding trauma to the area.  LEFT ELBOW - COMPLETE 3+ VIEW COMPARISON:  None. FINDINGS: There is no evidence of fracture, dislocation, or  joint effusion. There is no evidence of arthropathy or other focal bone abnormality. Mild edema within the subcutaneous fat of the posterior elbow and proximal forearm IMPRESSION: Negative.  Assessment/Plan: Given the acute onset of the pain and patient's limited range of motion, will obtain STAT elbow xray to evaluate for bony abnormalities, large effusion. Suspect patient may have olecranon bursitis, though swelling is not significant. - Advised patient to apply heat and use ibuprofen or other anti-inflammatory. Could consider orthosis, however also would not want to impede range of motion. If these treatments do not improve her symptoms, would consider alternative etiologies such as some sort of arthropathy such as arthritis or gout.   Past Medical History:  Diagnosis Date   Elevated cholesterol    Family history of breast cancer    Family history of prostate cancer    Heart murmur    Hypertension    Vertigo     Current Outpatient Medications on File Prior to Visit  Medication Sig Dispense Refill   amoxicillin (AMOXIL) 500 MG capsule Take 1 capsule (500 mg total) by mouth 3 (three) times daily. 21 capsule 0   cetirizine (ZYRTEC) 10 MG tablet Take 10 mg by mouth daily.      hydrochlorothiazide (HYDRODIURIL) 12.5 MG tablet Take 1 tablet by mouth daily 90 tablet 1   losartan (COZAAR) 50 MG tablet Take 1 tablet by mouth daily 90 tablet 0   meclizine (ANTIVERT) 12.5 MG tablet TAKE 1 TABLET BY MOUTH TWICE DAILY AS NEEDED.  60 tablet 2   meloxicam (MOBIC) 7.5 MG tablet Take 1 tablet (7.5 mg total) by mouth 1-2 times daily. 60 tablet 0   rosuvastatin (CRESTOR) 20 MG tablet Take 1 tablet (20 mg total) by mouth daily. 90 tablet 1   Vitamin D, Ergocalciferol, (DRISDOL) 1.25 MG (50000 UNIT) CAPS capsule TAKE 1 (ONE) CAPSULE BY MOUTH WEEKLY 4 capsule 5   Current Facility-Administered Medications on File Prior to Visit  Medication Dose Route Frequency Provider Last Rate Last Admin    betamethasone acetate-betamethasone sodium phosphate (CELESTONE) injection 12 mg  12 mg Other Once Magnus Sinning, MD        Past Surgical History:  Procedure Laterality Date   CYSTOSCOPY N/A 09/17/2019   Procedure: CYSTOSCOPY;  Surgeon: [name], MD;  Location: [facility];  Service: Gynecology;  Laterality: N/A;   NO PAST SURGERIES     TOTAL LAPAROSCOPIC HYSTERECTOMY WITH SALPINGECTOMY N/A 09/17/2019   Procedure: TOTAL LAPAROSCOPIC HYSTERECTOMY, BILATERAL SALPINGECTOMY;  Surgeon: [name], MD;  Location: [facility];  Service: Gynecology;  Laterality: N/A;    Allergies  Allergen Reactions   Chlorhexidine Gluconate Itching   Amlodipine Swelling   Latex Rash    AND HIVES , ANAPHYLACTIC REACTION     Social History   Socioeconomic History   Marital status: Single    Spouse name: Not on file   Number of children: 3   Years of education: 16   Highest education level: Not on file  Occupational History   Not on file  Tobacco Use   Smoking status: Never   Smokeless tobacco: Never  Vaping Use   Vaping Use: Never used  Substance and Sexual Activity   Alcohol use: No   Drug use: No   Sexual activity: Yes  Other Topics Concern   Not on file  Social History Narrative   Not on file   Social Determinants of Health   Financial Resource Strain: Not on file  Food Insecurity: Not on file  Transportation Needs: Not on file  Physical Activity: Not on file  Stress: Not on file  Social Connections: Not on file  Intimate Partner Violence: Not on file    Family History  Problem Relation Age of Onset   Diabetes Mother    Cancer Maternal Uncle        bone cancer    Heart disease Maternal Uncle    Dementia Maternal Grandmother    Breast cancer Paternal Grandmother 50       d. in her 1s   Breast cancer Cousin 52       mother's maternal first cousin   Breast cancer Paternal Aunt        dx early 68s   Breast cancer Maternal  Great-grandmother    Heart attack Father    Prostate cancer Paternal Uncle 61       dx 2nd time in his 51s   Prostate cancer Maternal Uncle 36   Breast cancer Other        PGMs sister dx in her 91s   Other Paternal Aunt        has breast lumps, watching her closely    Ht '5\' 8"'$  (1.727 m)   Wt 219 lb (99.3 kg)   LMP 09/09/2019   BMI 33.30 kg/m   No flowsheet data found.  No flowsheet data found.  Review of Systems: See HPI above.     Objective:  Physical Exam:  Gen: NAD, comfortable in  exam room Inspection: No visible ecchymosis or swelling today Palpation: TTP over lateral epicondyle and olecranon, brachioradialis insertion site ROM: Cannot fully extend, lacks 10 degrees extension. Strength: Grip strength 5/5 Stability: Joint stable Special Tests: Pain with resisted wrist extension, no pain with resisted wrist flexion, negative Tinel sign cubital tunnel, negative hook test, negative radial tunnel syndrome "middle finger test" Neurovascular: Sensation intact, skin warm to touch   Assessment & Plan:  1.  Left elbow pain:  Radiographs negative.  Brief MSK u/s without abnormalities of triceps or common extensor tendon.  Concern given lack of full extension and mechanical catching that she has an osteochondral defect not seen on radiographs.  Given lack of improvement over more than 6 weeks with conservative treatment also will go ahead with MRI without contrast. Voltaren gel 3 times daily.  Ice recommended.  We will follow-up with MRI results.   Ezequiel Essex, MD [place]

## 2020-09-07 NOTE — Patient Instructions (Addendum)
It was wonderful to meet you today. Thank you for allowing me to be a part of your care. Below is a short summary of what we discussed at your visit today:  Left elbow pain - Try voltaren gel from over the counter, apply it 3 times a day - Ice the elbow, avoiding the funny bone area - We will send you for an MRI of your elbow - Do not use a compression sleeve, as we do not believe will improve anything for you   If you have any questions or concerns, please do not hesitate to contact us via phone or MyChart message.   Ezequiel Essex, MD

## 2020-09-08 ENCOUNTER — Encounter: Payer: Self-pay | Admitting: Family Medicine

## 2020-09-16 ENCOUNTER — Other Ambulatory Visit: Payer: Self-pay

## 2020-09-16 ENCOUNTER — Ambulatory Visit
Admission: RE | Admit: 2020-09-16 | Discharge: 2020-09-16 | Disposition: A | Discharge status: Home / Self Care | Payer: 59 | Source: Ambulatory Visit | Attending: Family Medicine | Admitting: Family Medicine

## 2020-09-16 DIAGNOSIS — M7712 Lateral epicondylitis, left elbow: Secondary | ICD-10-CM | POA: Diagnosis not present

## 2020-09-16 DIAGNOSIS — M25522 Pain in left elbow: Secondary | ICD-10-CM

## 2020-09-23 ENCOUNTER — Other Ambulatory Visit: Payer: Self-pay

## 2020-09-23 DIAGNOSIS — M25522 Pain in left elbow: Secondary | ICD-10-CM

## 2020-10-16 ENCOUNTER — Ambulatory Visit: Payer: 59

## 2020-10-21 ENCOUNTER — Other Ambulatory Visit (HOSPITAL_COMMUNITY): Payer: Self-pay

## 2020-10-21 MED ORDER — HYDROCHLOROTHIAZIDE 12.5 MG PO TABS
12.5000 mg | ORAL_TABLET | Freq: Every day | ORAL | 0 refills | Status: DC
  Filled 2020-10-21: qty 90, 90d supply, fill #0

## 2020-10-21 MED FILL — Ergocalciferol Cap 1.25 MG (50000 Unit): ORAL | 28 days supply | Qty: 4 | Fill #2 | Status: AC

## 2020-11-16 ENCOUNTER — Encounter: Payer: Self-pay | Admitting: Obstetrics and Gynecology

## 2020-11-23 ENCOUNTER — Other Ambulatory Visit (HOSPITAL_COMMUNITY): Payer: Self-pay

## 2020-11-23 MED FILL — Ergocalciferol Cap 1.25 MG (50000 Unit): ORAL | 28 days supply | Qty: 4 | Fill #3 | Status: CN

## 2020-11-30 ENCOUNTER — Other Ambulatory Visit: Payer: Self-pay

## 2020-11-30 ENCOUNTER — Ambulatory Visit
Admission: RE | Admit: 2020-11-30 | Discharge: 2020-11-30 | Disposition: A | Discharge status: Home / Self Care | Payer: 59 | Source: Ambulatory Visit | Attending: Obstetrics and Gynecology | Admitting: Obstetrics and Gynecology

## 2020-11-30 DIAGNOSIS — Z1239 Encounter for other screening for malignant neoplasm of breast: Secondary | ICD-10-CM | POA: Diagnosis not present

## 2020-11-30 DIAGNOSIS — Z803 Family history of malignant neoplasm of breast: Secondary | ICD-10-CM

## 2020-11-30 DIAGNOSIS — D241 Benign neoplasm of right breast: Secondary | ICD-10-CM | POA: Diagnosis not present

## 2020-11-30 MED ORDER — GADOBUTROL 1 MMOL/ML IV SOLN
10.0000 mL | Freq: Once | INTRAVENOUS | Status: AC | PRN
  Administered 2020-11-30: 10 mL via INTRAVENOUS

## 2020-12-01 ENCOUNTER — Other Ambulatory Visit (HOSPITAL_COMMUNITY): Payer: Self-pay

## 2020-12-08 ENCOUNTER — Other Ambulatory Visit (HOSPITAL_COMMUNITY): Payer: Self-pay

## 2020-12-09 ENCOUNTER — Other Ambulatory Visit (HOSPITAL_COMMUNITY): Payer: Self-pay

## 2020-12-10 ENCOUNTER — Other Ambulatory Visit (HOSPITAL_COMMUNITY): Payer: Self-pay

## 2020-12-10 ENCOUNTER — Encounter: Payer: Self-pay | Admitting: Student

## 2020-12-10 ENCOUNTER — Ambulatory Visit (INDEPENDENT_AMBULATORY_CARE_PROVIDER_SITE_OTHER): Payer: 59 | Admitting: Student

## 2020-12-10 VITALS — BP 129/89 | HR 77 | Temp 98.3°F | Ht 68.0 in | Wt 227.3 lb

## 2020-12-10 DIAGNOSIS — E1169 Type 2 diabetes mellitus with other specified complication: Secondary | ICD-10-CM

## 2020-12-10 DIAGNOSIS — E785 Hyperlipidemia, unspecified: Secondary | ICD-10-CM | POA: Diagnosis not present

## 2020-12-10 DIAGNOSIS — E119 Type 2 diabetes mellitus without complications: Secondary | ICD-10-CM | POA: Insufficient documentation

## 2020-12-10 DIAGNOSIS — I10 Essential (primary) hypertension: Secondary | ICD-10-CM

## 2020-12-10 DIAGNOSIS — Z0000 Encounter for general adult medical examination without abnormal findings: Secondary | ICD-10-CM | POA: Diagnosis not present

## 2020-12-10 DIAGNOSIS — Z1211 Encounter for screening for malignant neoplasm of colon: Secondary | ICD-10-CM | POA: Diagnosis not present

## 2020-12-10 DIAGNOSIS — Z803 Family history of malignant neoplasm of breast: Secondary | ICD-10-CM

## 2020-12-10 LAB — POCT GLYCOSYLATED HEMOGLOBIN (HGB A1C): Hemoglobin A1C: 6.5 % — AB (ref 4.0–5.6)

## 2020-12-10 LAB — GLUCOSE, CAPILLARY: Glucose-Capillary: 114 mg/dL — ABNORMAL HIGH (ref 70–99)

## 2020-12-10 MED ORDER — HYDROCHLOROTHIAZIDE 12.5 MG PO TABS
12.5000 mg | ORAL_TABLET | Freq: Every day | ORAL | 0 refills | Status: DC
  Filled 2020-12-10: qty 30, 30d supply, fill #0

## 2020-12-10 MED ORDER — HYDROCHLOROTHIAZIDE 12.5 MG PO TABS
12.5000 mg | ORAL_TABLET | Freq: Every day | ORAL | 2 refills | Status: DC
  Filled 2020-12-10 – 2021-01-14 (×2): qty 90, 90d supply, fill #0
  Filled 2021-04-06: qty 90, 90d supply, fill #1
  Filled 2021-06-10 – 2021-06-22 (×2): qty 90, 90d supply, fill #2

## 2020-12-10 MED ORDER — ROSUVASTATIN CALCIUM 20 MG PO TABS
20.0000 mg | ORAL_TABLET | Freq: Every day | ORAL | 2 refills | Status: DC
  Filled 2020-12-10: qty 90, 90d supply, fill #0
  Filled 2021-03-09: qty 90, 90d supply, fill #1
  Filled 2021-06-10: qty 90, 90d supply, fill #2

## 2020-12-10 MED ORDER — LOSARTAN POTASSIUM 50 MG PO TABS
50.0000 mg | ORAL_TABLET | Freq: Every day | ORAL | 0 refills | Status: DC
  Filled 2020-12-10: qty 30, 30d supply, fill #0

## 2020-12-10 MED ORDER — LOSARTAN POTASSIUM 50 MG PO TABS
50.0000 mg | ORAL_TABLET | Freq: Every day | ORAL | 2 refills | Status: DC
  Filled 2020-12-10: qty 90, 90d supply, fill #0
  Filled 2021-03-09: qty 90, 90d supply, fill #1
  Filled 2021-06-10: qty 90, 90d supply, fill #2

## 2020-12-10 MED ORDER — ROSUVASTATIN CALCIUM 20 MG PO TABS
20.0000 mg | ORAL_TABLET | Freq: Every day | ORAL | 0 refills | Status: DC
  Filled 2020-12-10: qty 30, 30d supply, fill #0

## 2020-12-10 MED FILL — Ergocalciferol Cap 1.25 MG (50000 Unit): ORAL | 28 days supply | Qty: 4 | Fill #3 | Status: AC

## 2020-12-10 NOTE — Patient Instructions (Signed)
Ms. Alicia Obrien,  It was a pleasure seeing you in the clinic today.  He is me what we talked about:  1.  Hypertension: Your blood pressure is well controlled.  Please continue HCTZ and losartan.  I will check your kidney function today.  2.  Diabetes: We will check A1c today.  If confirmed diabetes, will start metformin.  Please check your feet every day for any new wound or ulcer.  Please follow-up with your eye doctor.  3.  High cholesterol: I will check a lipid panel today.  In the meantime continue Crestor 20 mg.  4.  Family history of breast cancer: I am glad that you are mammogram was unremarkable.  Please follow-up with them in 6 months.  5.  I placed a referral for colonoscopy.  6. Please follow-up with your OB/GYN for Pap smear

## 2020-12-10 NOTE — Progress Notes (Signed)
Internal Medicine Clinic Attending  Case discussed with Dr. Nguyen  At the time of the visit.  We reviewed the resident's history and exam and pertinent patient test results.  I agree with the assessment, diagnosis, and plan of care documented in the resident's note. 

## 2020-12-10 NOTE — Assessment & Plan Note (Addendum)
Patient report adherence to Crestor 20 mg.  Last lipid panel was 2017 in chart review.  Patient reports having her lipids checked at [facility] lipid panel today  Addendum LDL at goal for primary prevention of 99.  Continue Crestor

## 2020-12-10 NOTE — Assessment & Plan Note (Signed)
-  GI referral for colonoscopy -Hepatitis C check today -Patient has a partial hysterectomy due to abnormal uterine bleeding.  She still have her cervix.  She is due for a Pap smear with her OB/GYN next year

## 2020-12-10 NOTE — Assessment & Plan Note (Addendum)
Patient report was diagnosed with type 2 diabetes at [facility].  However she was not started on any medications.  -We will check A1c today.  If confirmed diabetes, patient agreed to start metformin -Advised patient to do foot exam daily -She will have an eye exam in the next few days.  Addendum A1c came back at 6.5.  Will start metformin 500 mg daily.  Advised patient of side effects of metformin.

## 2020-12-10 NOTE — Assessment & Plan Note (Addendum)
Blood pressure well controlled at 129/89.  Patient reports adherence to her medications.  Her last BMP was September 2021, which was unremarkable.  -Refill HCTZ 12.5 mg and losartan 25 mg -Check BMP today  Addendum BMP unremarkable

## 2020-12-10 NOTE — Assessment & Plan Note (Signed)
Patient report extensive family history of breast cancer.  She did undergo genetic testing which came back normal.  She has had a mammogram done last month which was stable.  She will have another imaging in 6 months.

## 2020-12-10 NOTE — Progress Notes (Signed)
   CC: Blood pressure recheck.  Medication refill.  HPI:  Ms.Ann Shanieka Blea is a 45 y.o. past medical history of hypertension, hyperlipidemia, diabetes, who presents to clinic for evaluation of hypertension and medication refill.  Her PCP is at [facility].  Patient however could not get an appointment so she will establish with [place].  Please see problem based running for detail.  Past Medical History:  Diagnosis Date   Elevated cholesterol    Family history of breast cancer    Family history of prostate cancer    Heart murmur    Hypertension    Vertigo    Review of Systems:   Per HPI  Physical Exam:  Vitals:   12/10/20 0912  BP: 129/89  Pulse: 77  Temp: 98.3 F (36.8 C)  TempSrc: Oral  SpO2: 100%  Weight: 227 lb 4.8 oz (103.1 kg)  Height: 5\' 8"  (1.727 m)   Physical Exam Constitutional:      General: She is not in acute distress.    Appearance: She is not ill-appearing.  HENT:     Head: Normocephalic.  Eyes:     General:        Right eye: No discharge.        Left eye: No discharge.     Conjunctiva/sclera: Conjunctivae normal.  Cardiovascular:     Rate and Rhythm: Normal rate and regular rhythm.     Heart sounds: Normal heart sounds. No murmur heard. Pulmonary:     Effort: Pulmonary effort is normal. No respiratory distress.     Breath sounds: Normal breath sounds. No wheezing.  Abdominal:     General: Bowel sounds are normal.  Skin:    General: Skin is warm.     Coloration: Skin is not jaundiced.  Neurological:     Mental Status: She is alert.  Psychiatric:        Mood and Affect: Mood normal.        Behavior: Behavior normal.     Assessment & Plan:   See Encounters Tab for problem based charting.  Patient discussed with Dr. Philipp Ovens

## 2020-12-11 ENCOUNTER — Other Ambulatory Visit (HOSPITAL_COMMUNITY): Payer: Self-pay

## 2020-12-11 LAB — BMP8+ANION GAP
Anion Gap: 15 mmol/L (ref 10.0–18.0)
BUN/Creatinine Ratio: 16 (ref 9–23)
BUN: 12 mg/dL (ref 6–24)
CO2: 26 mmol/L (ref 20–29)
Calcium: 10.2 mg/dL (ref 8.7–10.2)
Chloride: 97 mmol/L (ref 96–106)
Creatinine, Ser: 0.75 mg/dL (ref 0.57–1.00)
Glucose: 108 mg/dL — ABNORMAL HIGH (ref 70–99)
Potassium: 4.5 mmol/L (ref 3.5–5.2)
Sodium: 138 mmol/L (ref 134–144)
eGFR: 100 mL/min/{1.73_m2} (ref >59)

## 2020-12-11 LAB — LIPID PANEL
Chol/HDL Ratio: 3.5 ratio (ref 0.0–4.4)
Cholesterol, Total: 187 mg/dL (ref 100–199)
HDL: 54 mg/dL (ref >39)
LDL Chol Calc (NIH): 99 mg/dL (ref 0–99)
Triglycerides: 201 mg/dL — ABNORMAL HIGH (ref 0–149)
VLDL Cholesterol Cal: 34 mg/dL (ref 5–40)

## 2020-12-11 LAB — HEPATITIS C ANTIBODY: Hep C Virus Ab: 0.2 s/co ratio (ref 0.0–0.9)

## 2020-12-11 MED ORDER — METFORMIN HCL 500 MG PO TABS
500.0000 mg | ORAL_TABLET | Freq: Every day | ORAL | 11 refills | Status: DC
  Filled 2020-12-11: qty 30, 30d supply, fill #0
  Filled 2021-01-12: qty 30, 30d supply, fill #1
  Filled 2021-02-14: qty 30, 30d supply, fill #2

## 2020-12-11 NOTE — Addendum Note (Signed)
Addended byGaylan Gerold on: 12/11/2020 03:38 PM   Modules accepted: Orders

## 2020-12-23 ENCOUNTER — Encounter: Payer: Self-pay | Admitting: Obstetrics and Gynecology

## 2020-12-29 NOTE — Telephone Encounter (Signed)
Not sure why I received this.

## 2021-01-12 ENCOUNTER — Other Ambulatory Visit (HOSPITAL_COMMUNITY): Payer: Self-pay

## 2021-01-12 MED FILL — Ergocalciferol Cap 1.25 MG (50000 Unit): ORAL | 28 days supply | Qty: 4 | Fill #4 | Status: AC

## 2021-01-13 ENCOUNTER — Other Ambulatory Visit: Payer: Self-pay | Admitting: Obstetrics and Gynecology

## 2021-01-13 DIAGNOSIS — Z1231 Encounter for screening mammogram for malignant neoplasm of breast: Secondary | ICD-10-CM

## 2021-01-14 ENCOUNTER — Other Ambulatory Visit (HOSPITAL_COMMUNITY): Payer: Self-pay

## 2021-01-15 DIAGNOSIS — E119 Type 2 diabetes mellitus without complications: Secondary | ICD-10-CM | POA: Diagnosis not present

## 2021-02-05 DIAGNOSIS — Z03818 Encounter for observation for suspected exposure to other biological agents ruled out: Secondary | ICD-10-CM | POA: Diagnosis not present

## 2021-02-05 DIAGNOSIS — Z20822 Contact with and (suspected) exposure to covid-19: Secondary | ICD-10-CM | POA: Diagnosis not present

## 2021-02-15 ENCOUNTER — Other Ambulatory Visit (HOSPITAL_COMMUNITY): Payer: Self-pay

## 2021-02-17 ENCOUNTER — Other Ambulatory Visit (HOSPITAL_COMMUNITY): Payer: Self-pay

## 2021-02-17 ENCOUNTER — Ambulatory Visit (INDEPENDENT_AMBULATORY_CARE_PROVIDER_SITE_OTHER): Payer: 59 | Admitting: Student

## 2021-02-17 ENCOUNTER — Encounter: Payer: Self-pay | Admitting: Student

## 2021-02-17 VITALS — Temp 99.0°F | Ht 68.0 in | Wt 231.3 lb

## 2021-02-17 DIAGNOSIS — I10 Essential (primary) hypertension: Secondary | ICD-10-CM | POA: Diagnosis not present

## 2021-02-17 DIAGNOSIS — E669 Obesity, unspecified: Secondary | ICD-10-CM

## 2021-02-17 DIAGNOSIS — Z6835 Body mass index (BMI) 35.0-35.9, adult: Secondary | ICD-10-CM | POA: Diagnosis not present

## 2021-02-17 DIAGNOSIS — Z0000 Encounter for general adult medical examination without abnormal findings: Secondary | ICD-10-CM

## 2021-02-17 DIAGNOSIS — E785 Hyperlipidemia, unspecified: Secondary | ICD-10-CM

## 2021-02-17 DIAGNOSIS — E119 Type 2 diabetes mellitus without complications: Secondary | ICD-10-CM | POA: Diagnosis not present

## 2021-02-17 DIAGNOSIS — E559 Vitamin D deficiency, unspecified: Secondary | ICD-10-CM

## 2021-02-17 LAB — POCT GLYCOSYLATED HEMOGLOBIN (HGB A1C): Hemoglobin A1C: 6 % — AB (ref 4.0–5.6)

## 2021-02-17 LAB — GLUCOSE, CAPILLARY: Glucose-Capillary: 113 mg/dL — ABNORMAL HIGH (ref 70–99)

## 2021-02-17 MED ORDER — TRULICITY 0.75 MG/0.5ML ~~LOC~~ SOAJ
0.7500 mg | SUBCUTANEOUS | 2 refills | Status: DC
  Filled 2021-02-17: qty 6, 84d supply, fill #0
  Filled 2021-04-15: qty 6, 84d supply, fill #1

## 2021-02-17 NOTE — Assessment & Plan Note (Signed)
Patient was started on Crestor 20 mg daily in December for hyperlipidemia.  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  Her 10-year ASCVD was 2-5.5%.  -Will continue Crestor for her diabetes.

## 2021-02-17 NOTE — Assessment & Plan Note (Signed)
A1c of 6 today.  Patient reports adherence to metformin.  She states that she was on Saxenda in the past for weight loss but was told to discontinue in May by her PCP at [facility].  Patient reports that she has been taking Saxenda intermittently and this has stopped in December.  Said that she is gaining the weight back.  She will benefit from Trulicity for diabetes and weight loss.  -Start Trulicity 9.98 mg weekly -Stop metformin -She had a diabetic eye exam in December 2022 -Foot exam normal -A1c in 6 months

## 2021-02-17 NOTE — Assessment & Plan Note (Addendum)
Blood pressure well controlled.  She reports adherence to losartan and HCTZ.  BMP in December was normal.  -Continue losartan 25 mg and HCTZ 12.5 mg

## 2021-02-17 NOTE — Assessment & Plan Note (Addendum)
-  Patient had a total hysterectomy in 2021 -Waiting for Colosnocopy to be scheduled.  -Mammogram every 6 mo due to family history of breast cancer -She has received 4 shots of Covid vaccine. She will bring the card to follow up

## 2021-02-17 NOTE — Progress Notes (Signed)
° °  CC: F/u on HTN and DM  HPI:  [name] is a 46 y.o. with PMH of HTN, T2DM who present to the clinic for follow up on DM.  Please see problem based charting for detail.   Past Medical History:  Diagnosis Date   Elevated cholesterol    Family history of breast cancer    Family history of prostate cancer    Heart murmur    Hypertension    Vertigo    Review of Systems:  per HPI  Physical Exam:  Vitals:   02/17/21 1538  Temp: 99 F (37.2 C)  TempSrc: Oral  SpO2: 100%  Weight: 231 lb 4.8 oz (104.9 kg)  Height: 5\' 8"  (1.727 m)   Physical Exam Constitutional:      General: She is not in acute distress.    Appearance: She is not ill-appearing.  Eyes:     General:        Right eye: No discharge.        Left eye: No discharge.     Conjunctiva/sclera: Conjunctivae normal.  Cardiovascular:     Rate and Rhythm: Normal rate and regular rhythm.     Comments: No LE edema Pulmonary:     Effort: Pulmonary effort is normal. No respiratory distress.     Breath sounds: Normal breath sounds.  Musculoskeletal:     Comments: Bilateral feet inspected. No wound or ulcers noted. +2 pedis pulses palpated bilat.   Skin:    General: Skin is warm.  Neurological:     Mental Status: She is alert and oriented to person, place, and time.  Psychiatric:        Mood and Affect: Mood normal.        Behavior: Behavior normal.     Assessment & Plan:   See Encounters Tab for problem based charting.  Patient discussed with Dr.  Saverio Obrien

## 2021-02-18 NOTE — Progress Notes (Signed)
Internal Medicine Clinic Attending  Case discussed with Dr. Nguyen  At the time of the visit.  We reviewed the resident's history and exam and pertinent patient test results.  I agree with the assessment, diagnosis, and plan of care documented in the resident's note. 

## 2021-02-26 ENCOUNTER — Encounter: Payer: Self-pay | Admitting: Gastroenterology

## 2021-03-02 ENCOUNTER — Other Ambulatory Visit (HOSPITAL_COMMUNITY): Payer: Self-pay

## 2021-03-02 ENCOUNTER — Other Ambulatory Visit: Payer: Self-pay | Admitting: *Deleted

## 2021-03-02 ENCOUNTER — Other Ambulatory Visit: Payer: Self-pay | Admitting: Student

## 2021-03-03 ENCOUNTER — Other Ambulatory Visit (INDEPENDENT_AMBULATORY_CARE_PROVIDER_SITE_OTHER): Payer: 59

## 2021-03-03 ENCOUNTER — Other Ambulatory Visit (HOSPITAL_COMMUNITY): Payer: Self-pay

## 2021-03-03 DIAGNOSIS — E559 Vitamin D deficiency, unspecified: Secondary | ICD-10-CM | POA: Diagnosis not present

## 2021-03-03 NOTE — Addendum Note (Signed)
Addended byGaylan Gerold on: 03/03/2021 10:49 AM   Modules accepted: Orders

## 2021-03-03 NOTE — Assessment & Plan Note (Addendum)
Patient reports taking high dose vitamin D 50000 units weekly.  Said that she had her blood checked and was told continue medication.  -Recheck vitamin D level before refilling her medication  Addendum Vitamin D level WNL at 31.9. Will stop high dose Vit D. Advised patient take OTC maintaince Vit D from 600 - 2000 units daily.

## 2021-03-04 LAB — VITAMIN D 25 HYDROXY (VIT D DEFICIENCY, FRACTURES): Vit D, 25-Hydroxy: 31.9 ng/mL (ref 30.0–100.0)

## 2021-03-05 ENCOUNTER — Other Ambulatory Visit: Payer: Self-pay | Admitting: Student

## 2021-03-05 ENCOUNTER — Other Ambulatory Visit (HOSPITAL_COMMUNITY): Payer: Self-pay

## 2021-03-09 ENCOUNTER — Other Ambulatory Visit: Payer: Self-pay

## 2021-03-09 ENCOUNTER — Other Ambulatory Visit (HOSPITAL_COMMUNITY): Payer: Self-pay

## 2021-03-09 ENCOUNTER — Ambulatory Visit: Admission: RE | Admit: 2021-03-09 | Payer: 59 | Source: Ambulatory Visit

## 2021-03-10 ENCOUNTER — Encounter: Payer: Self-pay | Admitting: Obstetrics and Gynecology

## 2021-03-10 NOTE — Telephone Encounter (Signed)
Dr.Jertson, ? ?I didn't see in chart that patient should have mammogram every 6 months? I do see that she has screening mammograms and breast MRI's rotating.  ?We did receive her voicemail late yesterday pm which she basically said she must have screening mammograms every 6 months.  ? ?

## 2021-03-12 ENCOUNTER — Telehealth: Payer: 59 | Admitting: Obstetrics and Gynecology

## 2021-03-12 DIAGNOSIS — Z9189 Other specified personal risk factors, not elsewhere classified: Secondary | ICD-10-CM

## 2021-03-12 DIAGNOSIS — Z803 Family history of malignant neoplasm of breast: Secondary | ICD-10-CM

## 2021-03-12 NOTE — Telephone Encounter (Signed)
Please cancel the mammogram and order a f/u breast MRI for 5/23 (recommended on her MRI in 11/22). Thanks.  ?

## 2021-03-16 NOTE — Telephone Encounter (Signed)
Mri order will be placed closer to May 2023 ?

## 2021-04-06 ENCOUNTER — Other Ambulatory Visit (HOSPITAL_COMMUNITY): Payer: Self-pay

## 2021-04-15 ENCOUNTER — Ambulatory Visit (AMBULATORY_SURGERY_CENTER): Payer: 59 | Admitting: *Deleted

## 2021-04-15 ENCOUNTER — Other Ambulatory Visit (HOSPITAL_COMMUNITY): Payer: Self-pay

## 2021-04-15 VITALS — Ht 68.0 in | Wt 234.0 lb

## 2021-04-15 DIAGNOSIS — Z1211 Encounter for screening for malignant neoplasm of colon: Secondary | ICD-10-CM

## 2021-04-15 MED ORDER — NA SULFATE-K SULFATE-MG SULF 17.5-3.13-1.6 GM/177ML PO SOLN
1.0000 | Freq: Once | ORAL | 0 refills | Status: AC
  Filled 2021-04-15: qty 354, 1d supply, fill #0

## 2021-04-15 NOTE — Progress Notes (Signed)
No egg or soy allergy known to patient  ?No issues known to pt with past sedation with any surgeries or procedures ?Patient denies ever being told they had issues or difficulty with intubation  ?No FH of Malignant Hyperthermia ?Pt is not on diet pills ?Pt is not on  home 02  ?Pt is not on blood thinners  ?Pt states  issues with constipation at times- uses stool softener PRN, has to take 8 to make it work- 2 day Suprep  ?No A fib or A flutter ? ? NO PA's for preps discussed with pt In PV today  ?Discussed with pt there will be an out-of-pocket cost for prep and that varies from $0 to 70 +  dollars - pt verbalized understanding  ? ?Due to the COVID-19 pandemic we are asking patients to follow certain guidelines in PV and the [place]   ?Pt aware of COVID protocols and [facility] guidelines  ? ?PV completed over the phone. Pt verified name, DOB, address and insurance during PV today.  ?Pt encouraged to call with questions or issues.  ?If pt has My chart, procedure instructions sent via My Chart  ? ?

## 2021-04-21 NOTE — Telephone Encounter (Signed)
Left message for patient to call and let us know if okay to go ahead and place her MRI order for May. ?

## 2021-04-22 ENCOUNTER — Encounter: Payer: Self-pay | Admitting: Gastroenterology

## 2021-04-23 ENCOUNTER — Other Ambulatory Visit: Payer: Self-pay | Admitting: Student

## 2021-04-23 ENCOUNTER — Other Ambulatory Visit (HOSPITAL_COMMUNITY): Payer: Self-pay

## 2021-04-23 DIAGNOSIS — E119 Type 2 diabetes mellitus without complications: Secondary | ICD-10-CM

## 2021-04-23 DIAGNOSIS — E669 Obesity, unspecified: Secondary | ICD-10-CM

## 2021-04-23 MED ORDER — TRULICITY 1.5 MG/0.5ML ~~LOC~~ SOAJ
1.5000 mg | SUBCUTANEOUS | 5 refills | Status: DC
  Filled 2021-04-23: qty 2, 28d supply, fill #0
  Filled 2021-05-17 – 2021-06-10 (×2): qty 2, 28d supply, fill #1
  Filled 2021-06-30: qty 2, 28d supply, fill #2

## 2021-04-23 NOTE — Progress Notes (Signed)
Patient reports weight gain despite being on Trulicity. She has been on 3.55 mg Trulicity for > 3 months and tolerate the medication well. Will increase to 1.5 mg dose.  ?

## 2021-04-26 NOTE — Addendum Note (Signed)
Addended by: Thamas Jaegers on: 04/26/2021 04:33 PM ? ? Modules accepted: Orders ? ?

## 2021-04-30 ENCOUNTER — Encounter: Payer: Self-pay | Admitting: Gastroenterology

## 2021-04-30 ENCOUNTER — Ambulatory Visit (AMBULATORY_SURGERY_CENTER): Payer: 59 | Admitting: Gastroenterology

## 2021-04-30 VITALS — BP 116/70 | HR 96 | Temp 96.6°F | Resp 19 | Ht 68.0 in | Wt 231.0 lb

## 2021-04-30 DIAGNOSIS — Z1211 Encounter for screening for malignant neoplasm of colon: Secondary | ICD-10-CM | POA: Diagnosis not present

## 2021-04-30 DIAGNOSIS — K219 Gastro-esophageal reflux disease without esophagitis: Secondary | ICD-10-CM | POA: Diagnosis not present

## 2021-04-30 DIAGNOSIS — E119 Type 2 diabetes mellitus without complications: Secondary | ICD-10-CM | POA: Diagnosis not present

## 2021-04-30 DIAGNOSIS — I10 Essential (primary) hypertension: Secondary | ICD-10-CM | POA: Diagnosis not present

## 2021-04-30 MED ORDER — SODIUM CHLORIDE 0.9 % IV SOLN: 500.0000 mL | Freq: Once | INTRAVENOUS | Status: DC

## 2021-04-30 NOTE — Op Note (Signed)
[facility] ?Patient Name: Alicia Obrien ?Procedure Date: 04/30/2021 2:04 PM ?MRN: [id number] ?Endoscopist: Milus Banister , MD ?Age: 46 ?Referring MD:  ?Date of Birth: [birth year] ?Gender: Female ?Account #: [id number] ?Procedure:                Colonoscopy ?Indications:              Screening for colorectal malignant neoplasm ?Medicines:                Monitored Anesthesia Care ?Procedure:                Pre-Anesthesia Assessment: ?                          - Prior to the procedure, a History and Physical  ?                          was performed, and patient medications and  ?                          allergies were reviewed. The patient's tolerance of  ?                          previous anesthesia was also reviewed. The risks  ?                          and benefits of the procedure and the sedation  ?                          options and risks were discussed with the patient.  ?                          All questions were answered, and informed consent  ?                          was obtained. Prior Anticoagulants: The patient has  ?                          taken no previous anticoagulant or antiplatelet  ?                          agents. ASA Grade Assessment: II - A patient with  ?                          mild systemic disease. After reviewing the risks  ?                          and benefits, the patient was deemed in  ?                          satisfactory condition to undergo the procedure. ?                          After obtaining informed consent, the colonoscope  ?  was passed under direct vision. Throughout the  ?                          procedure, the patient's blood pressure, pulse, and  ?                          oxygen saturations were monitored continuously. The  ?                          Olympus CF-[id number] ([id number]) Colonoscope was  ?                          introduced through the anus and advanced to the the  ?                          cecum,  identified by appendiceal orifice and  ?                          ileocecal valve. The colonoscopy was performed  ?                          without difficulty. The patient tolerated the  ?                          procedure well. The quality of the bowel  ?                          preparation was good. The ileocecal valve,  ?                          appendiceal orifice, and rectum were photographed. ?Scope In: 2:35:43 PM ?Scope Out: 2:45:42 PM ?Scope Withdrawal Time: 0 hours 7 minutes 14 seconds  ?Total Procedure Duration: 0 hours 9 minutes 59 seconds  ?Findings:                 The entire examined colon appeared normal on direct  ?                          and retroflexion views. ?Complications:            No immediate complications. Estimated blood loss:  ?                          None. ?Estimated Blood Loss:     Estimated blood loss: none. ?Impression:               - The entire examined colon is normal on direct and  ?                          retroflexion views. ?                          - No polyps or cancers. ?Recommendation:           - Patient has a contact number available for  ?  emergencies. The signs and symptoms of potential  ?                          delayed complications were discussed with the  ?                          patient. Return to normal activities tomorrow.  ?                          Written discharge instructions were provided to the  ?                          patient. ?                          - Resume previous diet. ?                          - Continue present medications. ?                          - Repeat colonoscopy in 10 years for screening. ?Milus Banister, MD ?04/30/2021 2:47:19 PM ?This report has been signed electronically. ?

## 2021-04-30 NOTE — Progress Notes (Signed)
No changes to medical or surgical history since last visit ?

## 2021-04-30 NOTE — Progress Notes (Signed)
Pt's states no medical or surgical changes since previsit or office visit. 

## 2021-04-30 NOTE — Patient Instructions (Addendum)
There were no polyps seen today!  You will need another screening colonoscopy in 10 years, you will receive a letter at that time when you are due for the procedure.   Please call us at [phone] if you have a change in bowel habits, change in family history of colo-rectal cancer, rectal bleeding or other GI concern before that time.  ? ?YOU HAD AN ENDOSCOPIC PROCEDURE TODAY AT [facility]:   Refer to the procedure report that was given to you for any specific questions about what was found during the examination.  If the procedure report does not answer your questions, please call your gastroenterologist to clarify.  If you requested that your care partner not be given the details of your procedure findings, then the procedure report has been included in a sealed envelope for you to review at your convenience later. ? ?YOU SHOULD EXPECT: Some feelings of bloating in the abdomen. Passage of more gas than usual.  Walking can help get rid of the air that was put into your GI tract during the procedure and reduce the bloating. If you had a lower endoscopy (such as a colonoscopy or flexible sigmoidoscopy) you may notice spotting of blood in your stool or on the toilet paper. If you underwent a bowel prep for your procedure, you may not have a normal bowel movement for a few days. ? ?Please Note:  You might notice some irritation and congestion in your nose or some drainage.  This is from the oxygen used during your procedure.  There is no need for concern and it should clear up in a day or so. ? ?SYMPTOMS TO REPORT IMMEDIATELY: ? ?Following lower endoscopy (colonoscopy): ? Excessive amounts of blood in the stool ? Significant tenderness or worsening of abdominal pains ? Swelling of the abdomen that is new, acute ? Fever of 100?F or higher ? ?For urgent or emergent issues, a gastroenterologist can be reached at any hour by calling [phone]. ?Do not use MyChart messaging for urgent concerns.   ? ? ?DIET:  We do recommend a small meal at first, but then you may proceed to your regular diet.  Drink plenty of fluids but you should avoid alcoholic beverages for 24 hours. ? ?ACTIVITY:  You should plan to take it easy for the rest of today and you should NOT DRIVE or use heavy machinery until tomorrow (because of the sedation medicines used during the test).   ? ?FOLLOW UP: ?Our staff will call the number listed on your records 48-72 hours following your procedure to check on you and address any questions or concerns that you may have regarding the information given to you following your procedure. If we do not reach you, we will leave a message.  We will attempt to reach you two times.  During this call, we will ask if you have developed any symptoms of COVID 19. If you develop any symptoms (ie: fever, flu-like symptoms, shortness of breath, cough etc.) before then, please call [phone].  If you test positive for Covid 19 in the 2 weeks post procedure, please call and report this information to [place].   ? ?If any biopsies were taken you will be contacted by phone or by letter within the next 1-3 weeks.  Please call us at [phone] if you have not heard about the biopsies in 3 weeks.  ? ? ?SIGNATURES/CONFIDENTIALITY: ?You and/or your care partner have signed paperwork which will be entered into your  electronic medical record.  These signatures attest to the fact that that the information above on your After Visit Summary has been reviewed and is understood.  Full responsibility of the confidentiality of this discharge information lies with you and/or your care-partner.  ?

## 2021-04-30 NOTE — Progress Notes (Signed)
HPI: ?This is a woman at routine risk for CRC ? ? ?ROS: complete GI ROS as described in HPI, all other review negative. ? ?Constitutional:  No unintentional weight loss ? ? ?Past Medical History:  ?Diagnosis Date  ? Allergy   ? Anemia   ? younger  ? Constipation   ? Diabetes mellitus without complication ([place])   ? Elevated cholesterol   ? Family history of breast cancer   ? Family history of prostate cancer   ? GERD (gastroesophageal reflux disease)   ? Heart murmur   ? Hypertension   ? Vertigo   ? ? ?Past Surgical History:  ?Procedure Laterality Date  ? CYSTOSCOPY N/A 09/17/2019  ? Procedure: CYSTOSCOPY;  Surgeon: [name], MD;  Location[facility];  Service: Gynecology;  Laterality: N/A;  ? TOTAL LAPAROSCOPIC HYSTERECTOMY WITH SALPINGECTOMY N/A 09/17/2019  ? Procedure: TOTAL LAPAROSCOPIC HYSTERECTOMY, BILATERAL SALPINGECTOMY;  Surgeon: [name], MD;  Location: [facility];  Service: Gynecology;  Laterality: N/A;  ? ? ?Current Outpatient Medications  ?Medication Sig Dispense Refill  ? cetirizine (ZYRTEC) 10 MG tablet Take 10 mg by mouth daily.     ? Dulaglutide (TRULICITY) 1.5 KJ/1.7HX SOPN Inject 1.5 mg into the skin once a week. 2 mL 5  ? hydrochlorothiazide (HYDRODIURIL) 12.5 MG tablet Take 1 tablet (12.5 mg total) by mouth daily. 90 tablet 2  ? losartan (COZAAR) 50 MG tablet Take 1 tablet by mouth daily 90 tablet 2  ? naproxen sodium (ALEVE) 220 MG tablet Take 220 mg by mouth.    ? omeprazole (PRILOSEC) 20 MG capsule Take 20 mg by mouth daily.    ? rosuvastatin (CRESTOR) 20 MG tablet Take 1 tablet (20 mg total) by mouth daily. 90 tablet 2  ? ?Current Facility-Administered Medications  ?Medication Dose Route Frequency Provider Last Rate Last Admin  ? 0.9 %  sodium chloride infusion  500 mL Intravenous Once Milus Banister, MD      ? betamethasone acetate-betamethasone sodium phosphate (CELESTONE) injection 12 mg  12 mg Other Once Magnus Sinning, MD       ? ? ?Allergies as of 04/30/2021 - Review Complete 04/15/2021  ?Allergen Reaction Noted  ? Chlorhexidine gluconate Itching 09/17/2019  ? Amlodipine Swelling 05/06/2020  ? Latex Rash 04/26/2014  ? ? ?Family History  ?Problem Relation Age of Onset  ? Diabetes Mother   ? Heart attack Father   ? Cancer Maternal Uncle   ?     bone cancer   ? Heart disease Maternal Uncle   ? Prostate cancer Maternal Uncle 45  ? Breast cancer Paternal Aunt   ?     dx early 81s  ? Other Paternal Aunt   ?     has breast lumps, watching her closely  ? Prostate cancer Paternal Uncle 25  ?     dx 2nd time in his 60s  ? Dementia Maternal Grandmother   ? Breast cancer Paternal Grandmother 6  ?     d. in her 63s  ? Breast cancer Cousin 65  ?     mother's maternal first cousin  ? Breast cancer Maternal Great-grandmother   ? Breast cancer Other   ?     PGMs sister dx in her 1s  ? Colon polyps Neg Hx   ? Colon cancer Neg Hx   ? ? ?Social History  ? ?Socioeconomic History  ? Marital status: Single  ?  Spouse name: Not on file  ?  Number of children: 3  ? Years of education: 56  ? Highest education level: Not on file  ?Occupational History  ? Not on file  ?Tobacco Use  ? Smoking status: Never  ? Smokeless tobacco: Never  ?Vaping Use  ? Vaping Use: Never used  ?Substance and Sexual Activity  ? Alcohol use: No  ? Drug use: No  ? Sexual activity: Yes  ?Other Topics Concern  ? Not on file  ?Social History Narrative  ? Not on file  ? ?Social Determinants of Health  ? ?Financial Resource Strain: Not on file  ?Food Insecurity: Not on file  ?Transportation Needs: Not on file  ?Physical Activity: Not on file  ?Stress: Not on file  ?Social Connections: Not on file  ?Intimate Partner Violence: Not on file  ? ? ? ?Physical Exam: ? ?Constitutional: generally well-appearing ?Psychiatric: alert and oriented x3 ?Lungs: CTA bilaterally ?Heart: no MCR ? ?Assessment and plan: ?46 y.o. female with routine risk for CRC ? ?Screening colonoscopy today ? ?Care is appropriate  for the ambulatory setting. ? ?Owens Loffler, MD ?[facility] ?04/30/2021, 2:10 PM ? ? ? ?

## 2021-04-30 NOTE — Progress Notes (Signed)
Report to PACU, RN, vss, BBS= Clear.  

## 2021-05-04 ENCOUNTER — Telehealth: Payer: Self-pay

## 2021-05-04 NOTE — Telephone Encounter (Signed)
?  Follow up Call- ? ? ?  04/30/2021  ?  2:13 PM  ?Call back number  ?Post procedure Call Back phone  # [phone]  ?Permission to leave phone message Yes  ?  ? ?Patient questions: ? ?Do you have a fever, pain , or abdominal swelling? No. ?Pain Score  0 * ? ?Have you tolerated food without any problems? Yes.   ? ?Have you been able to return to your normal activities? Yes.   ? ?Do you have any questions about your discharge instructions: ?Diet   No. ?Medications  No. ?Follow up visit  No. ? ?Do you have questions or concerns about your Care? No. ? ?Actions: ?* If pain score is 4 or above: ?No action needed, pain <4. ? ? ?

## 2021-05-05 NOTE — Telephone Encounter (Signed)
[facility] left message x2 to schedule.  ?

## 2021-05-17 ENCOUNTER — Other Ambulatory Visit (HOSPITAL_COMMUNITY): Payer: Self-pay

## 2021-05-18 ENCOUNTER — Ambulatory Visit (INDEPENDENT_AMBULATORY_CARE_PROVIDER_SITE_OTHER): Payer: 59 | Admitting: Internal Medicine

## 2021-05-18 ENCOUNTER — Other Ambulatory Visit (HOSPITAL_COMMUNITY): Payer: Self-pay

## 2021-05-18 DIAGNOSIS — J3489 Other specified disorders of nose and nasal sinuses: Secondary | ICD-10-CM

## 2021-05-18 MED ORDER — FLUTICASONE PROPIONATE 50 MCG/ACT NA SUSP
1.0000 | Freq: Every day | NASAL | 2 refills | Status: DC
  Filled 2021-05-18: qty 16, 60d supply, fill #0

## 2021-05-18 NOTE — Assessment & Plan Note (Signed)
-   Patient has a history of allergic rhinitis and has noted increased rhinorrhea over the last week ?-Patient states that yesterday she had a complicated migraine with decreased vision out of her left eye.  Patient states that she took Nurtec and these symptoms resolved ?-Patient complained of increased rhinorrhea today and also noted sinus pressure and right-sided headache.  Patient states that headache is worse with forward bending of head ?-No fevers or chills, no visual disturbance, no postnasal drip, no cough ?-On exam, patient did not have any maxillary sinus tenderness but did have mild right ethmoid symptoms tenderness ?-Patient instructed to continue on cetirizine and take warm showers with steam inhalation ?-Patient also advised to do Nettie pot (nasal rinse) to help decrease her congestion ?-I will prescribe Flonase for her to help with her allergic rhinitis ?-No indication for antibiotics at this time ?-Patient instructed to call us if worsening headache, fevers/chills or worsening sinus pressure ?

## 2021-05-18 NOTE — Progress Notes (Signed)
? ?  Subjective:  ? ? Patient ID: Alicia Obrien, female    DOB: November 15, 1975, 46 y.o.   MRN: [id number] ? ?Headache  ?Associated symptoms include rhinorrhea and sinus pressure.  ? ?I saw this patient today for an acute visit.  Patient is complaining of right-sided headache and sinus pressure. ? ?She denies any other acute complaints at this time.  States that she is compliant with all her medications. ? ? ?Review of Systems  ?Constitutional: Negative.   ?HENT:  Positive for rhinorrhea and sinus pressure.   ?Eyes: Negative.   ?Respiratory: Negative.    ?Cardiovascular: Negative.   ?Gastrointestinal: Negative.   ?Musculoskeletal: Negative.   ?Neurological:  Positive for headaches.  ?     Patient complained of a complicated migraine yesterday which improved with Nurtec.  States that she had decreased vision of one of her eyes but this is now resolved.  ?Psychiatric/Behavioral: Negative.    ? ?   ?Objective:  ? Physical Exam ?HENT:  ?   Head: Normocephalic and atraumatic.  ?   Right Ear: Tympanic membrane normal.  ?   Left Ear: Tympanic membrane normal.  ?   Nose: Rhinorrhea present.  ?   Mouth/Throat:  ?   Mouth: Mucous membranes are moist.  ?   Pharynx: Oropharynx is clear. No oropharyngeal exudate or posterior oropharyngeal erythema.  ?Cardiovascular:  ?   Rate and Rhythm: Normal rate and regular rhythm.  ?   Heart sounds: Normal heart sounds.  ?Pulmonary:  ?   Effort: Pulmonary effort is normal. No respiratory distress.  ?   Breath sounds: Normal breath sounds. No wheezing.  ?Musculoskeletal:     ?   General: No swelling or tenderness.  ?   Cervical back: Neck supple.  ?Lymphadenopathy:  ?   Cervical: No cervical adenopathy.  ?Neurological:  ?   General: No focal deficit present.  ?   Mental Status: She is alert and oriented to person, place, and time.  ?Psychiatric:     ?   Mood and Affect: Mood normal.     ?   Behavior: Behavior normal.  ? ? ? ? ? ?   ?Assessment & Plan:  ? ?Please see problem based charting  for assessment and plan: ?

## 2021-05-19 DIAGNOSIS — I10 Essential (primary) hypertension: Secondary | ICD-10-CM | POA: Diagnosis not present

## 2021-05-19 DIAGNOSIS — J0140 Acute pansinusitis, unspecified: Secondary | ICD-10-CM | POA: Diagnosis not present

## 2021-05-24 NOTE — Telephone Encounter (Signed)
Mri breast scheduled on 06/05/21  ?

## 2021-05-25 ENCOUNTER — Other Ambulatory Visit: Payer: Self-pay | Admitting: *Deleted

## 2021-05-25 DIAGNOSIS — Z803 Family history of malignant neoplasm of breast: Secondary | ICD-10-CM

## 2021-05-25 DIAGNOSIS — R928 Other abnormal and inconclusive findings on diagnostic imaging of breast: Secondary | ICD-10-CM

## 2021-05-25 NOTE — Telephone Encounter (Signed)
Initiated Prior Authorization via [employer]. ?

## 2021-05-26 ENCOUNTER — Other Ambulatory Visit (HOSPITAL_COMMUNITY): Payer: Self-pay

## 2021-05-31 NOTE — Telephone Encounter (Signed)
Authorization Voided. No PA Req'd  Ref#: [id number]  Encounter previously closed.

## 2021-06-05 ENCOUNTER — Other Ambulatory Visit: Payer: 59

## 2021-06-10 ENCOUNTER — Other Ambulatory Visit (HOSPITAL_COMMUNITY): Payer: Self-pay

## 2021-06-22 ENCOUNTER — Other Ambulatory Visit (HOSPITAL_COMMUNITY): Payer: Self-pay

## 2021-06-26 ENCOUNTER — Ambulatory Visit
Admission: RE | Admit: 2021-06-26 | Discharge: 2021-06-26 | Disposition: A | Discharge status: Home / Self Care | Payer: 59 | Source: Ambulatory Visit | Attending: Obstetrics and Gynecology | Admitting: Obstetrics and Gynecology

## 2021-06-26 DIAGNOSIS — R928 Other abnormal and inconclusive findings on diagnostic imaging of breast: Secondary | ICD-10-CM

## 2021-06-26 DIAGNOSIS — Z803 Family history of malignant neoplasm of breast: Secondary | ICD-10-CM

## 2021-06-28 ENCOUNTER — Other Ambulatory Visit (HOSPITAL_COMMUNITY): Payer: Self-pay

## 2021-06-30 ENCOUNTER — Other Ambulatory Visit (HOSPITAL_COMMUNITY): Payer: Self-pay

## 2021-07-06 ENCOUNTER — Other Ambulatory Visit: Payer: Self-pay | Admitting: Obstetrics and Gynecology

## 2021-07-06 ENCOUNTER — Ambulatory Visit
Admission: RE | Admit: 2021-07-06 | Discharge: 2021-07-06 | Disposition: A | Discharge status: Home / Self Care | Payer: 59 | Source: Ambulatory Visit | Attending: Obstetrics and Gynecology | Admitting: Obstetrics and Gynecology

## 2021-07-06 DIAGNOSIS — Z1239 Encounter for other screening for malignant neoplasm of breast: Secondary | ICD-10-CM | POA: Diagnosis not present

## 2021-07-06 DIAGNOSIS — Z803 Family history of malignant neoplasm of breast: Secondary | ICD-10-CM

## 2021-07-06 DIAGNOSIS — N6489 Other specified disorders of breast: Secondary | ICD-10-CM | POA: Diagnosis not present

## 2021-07-06 DIAGNOSIS — R928 Other abnormal and inconclusive findings on diagnostic imaging of breast: Secondary | ICD-10-CM

## 2021-07-06 MED ORDER — GADOBUTROL 1 MMOL/ML IV SOLN
10.0000 mL | Freq: Once | INTRAVENOUS | Status: DC | PRN
Start: 2021-07-06 — End: 2021-07-06

## 2021-07-06 MED ORDER — GADOBUTROL 1 MMOL/ML IV SOLN
10.0000 mL | Freq: Once | INTRAVENOUS | Status: AC | PRN
  Administered 2021-07-06: 10 mL via INTRAVENOUS

## 2021-08-21 ENCOUNTER — Encounter (HOSPITAL_COMMUNITY): Payer: Self-pay | Admitting: Emergency Medicine

## 2021-08-21 ENCOUNTER — Other Ambulatory Visit: Payer: Self-pay

## 2021-08-21 ENCOUNTER — Ambulatory Visit (HOSPITAL_COMMUNITY)
Admission: EM | Admit: 2021-08-21 | Discharge: 2021-08-21 | Disposition: A | Discharge status: Home / Self Care | Payer: 59 | Attending: Physician Assistant | Admitting: Physician Assistant

## 2021-08-21 DIAGNOSIS — Z20822 Contact with and (suspected) exposure to covid-19: Secondary | ICD-10-CM | POA: Diagnosis not present

## 2021-08-21 DIAGNOSIS — J029 Acute pharyngitis, unspecified: Secondary | ICD-10-CM

## 2021-08-21 LAB — POCT RAPID STREP A, ED / UC: Streptococcus, Group A Screen (Direct): NEGATIVE

## 2021-08-21 MED ORDER — AMOXICILLIN 500 MG PO CAPS: 500.0000 mg | ORAL_CAPSULE | Freq: Three times a day (TID) | ORAL | 0 refills | Status: DC

## 2021-08-21 NOTE — ED Triage Notes (Signed)
Reports throat pain started this morning.  Reports seeing white on tonsils.  Unknown fever.  Patient feels drainage in throat.

## 2021-08-21 NOTE — ED Provider Notes (Signed)
[place]    CSN: [id number] Arrival date & time: 08/21/21  1011      History   Chief Complaint Chief Complaint  Patient presents with   Sore Throat    HPI Alicia Obrien is a 46 y.o. female.   Patient here today for evaluation of sore throat that started this morning.  She states she has seen white patches on her tonsils.  She has not had fever.  She denies any cough.  She does have mild nasal drainage, PND.  She has not taken any medication for symptoms.  The history is provided by the patient.  Sore Throat Pertinent negatives include no shortness of breath.    Past Medical History:  Diagnosis Date   Allergy    Anemia    younger   Constipation    Diabetes mellitus without complication (HCC)    Elevated cholesterol    Family history of breast cancer    Family history of prostate cancer    GERD (gastroesophageal reflux disease)    Heart murmur    Hypertension    Vertigo     Patient Active Problem List   Diagnosis Date Noted   Sinus pressure 05/18/2021   Vitamin D deficiency 03/03/2021   Type 2 diabetes mellitus ([place]) 12/10/2020   Healthcare maintenance 12/10/2020   Left elbow pain 06/26/2020   Swelling of lower extremity 05/06/2020   Lumbar radiculopathy 11/27/2019   Furuncle of nose 11/19/2019   Status post laparoscopic hysterectomy 09/17/2019   Genetic testing 08/12/2019   Family history of breast cancer    Obesity 03/27/2015   Essential hypertension 01/28/2015   History of ovarian cyst 01/28/2015   Hyperlipidemia 01/28/2015    Past Surgical History:  Procedure Laterality Date   CYSTOSCOPY N/A 09/17/2019   Procedure: CYSTOSCOPY;  Surgeon: [name], MD;  Location: [facility];  Service: Gynecology;  Laterality: N/A;   TOTAL LAPAROSCOPIC HYSTERECTOMY WITH SALPINGECTOMY N/A 09/17/2019   Procedure: TOTAL LAPAROSCOPIC HYSTERECTOMY, BILATERAL SALPINGECTOMY;  Surgeon: [name], MD;  Location:  [facility];  Service: Gynecology;  Laterality: N/A;    OB History     Gravida  5   Para  3   Term  3   Preterm      AB  2   Living  3      SAB  2   IAB      Ectopic      Multiple      Live Births               Home Medications    Prior to Admission medications   Medication Sig Start Date End Date Taking? Authorizing Provider  amoxicillin (AMOXIL) 500 MG capsule Take 1 capsule (500 mg total) by mouth 3 (three) times daily. 08/21/21  Yes Francene Finders, PA-C  cetirizine (ZYRTEC) 10 MG tablet Take 10 mg by mouth daily. 03/30/19   [provider]  Dulaglutide (TRULICITY) 1.5 OQ/9.4TM SOPN Inject 1.5 mg into the skin once a week. Patient not taking: Reported on 08/21/2021 04/23/21   Gaylan Gerold, DO  fluticasone [facility]) 50 MCG/ACT nasal spray Place 1 spray into both nostrils daily. Patient not taking: Reported on 08/21/2021 05/18/21   Aldine Contes, MD  hydrochlorothiazide (HYDRODIURIL) 12.5 MG tablet Take 1 tablet (12.5 mg total) by mouth daily. 12/10/20   Gaylan Gerold, DO  losartan (COZAAR) 50 MG tablet Take 1 tablet by mouth daily 12/10/20   Gaylan Gerold,  DO  naproxen sodium (ALEVE) 220 MG tablet Take 220 mg by mouth. Patient not taking: Reported on 08/21/2021    [provider]  omeprazole (PRILOSEC) 20 MG capsule Take 20 mg by mouth daily.    [provider]  rosuvastatin (CRESTOR) 20 MG tablet Take 1 tablet (20 mg total) by mouth daily. 12/10/20   Gaylan Gerold, DO    Family History Family History  Problem Relation Age of Onset   Diabetes Mother    Heart attack Father    Cancer Maternal Uncle        bone cancer    Heart disease Maternal Uncle    Prostate cancer Maternal Uncle 77   Breast cancer Paternal Aunt        dx early 55s   Other Paternal Aunt        has breast lumps, watching her closely   Prostate cancer Paternal Uncle 12       dx 2nd time in his 17s   Dementia Maternal Grandmother    Breast cancer  Paternal Grandmother 38       d. in her 59s   Breast cancer Cousin 73       mother's maternal first cousin   Breast cancer Maternal Great-grandmother    Breast cancer Other        PGMs sister dx in her 96s   Colon polyps Neg Hx    Colon cancer Neg Hx     Social History Social History   Tobacco Use   Smoking status: Never   Smokeless tobacco: Never  Vaping Use   Vaping Use: Never used  Substance Use Topics   Alcohol use: No   Drug use: No     Allergies   Chlorhexidine gluconate, Amlodipine, and Latex   Review of Systems Review of Systems  Constitutional:  Negative for chills and fever.  HENT:  Positive for postnasal drip and sore throat. Negative for congestion.   Eyes:  Negative for discharge and redness.  Respiratory:  Negative for cough and shortness of breath.   Gastrointestinal:  Negative for nausea and vomiting.     Physical Exam Triage Vital Signs ED Triage Vitals  Enc Vitals Group     BP 08/21/21 1113 126/84     Pulse Rate 08/21/21 1113 73     Resp 08/21/21 1113 20     Temp 08/21/21 1113 98.4 F (36.9 C)     Temp Source 08/21/21 1113 Oral     SpO2 08/21/21 1113 97 %     Weight --      Height --      Head Circumference --      Peak Flow --      Pain Score 08/21/21 1109 7     Pain Loc --      Pain Edu? --      Excl. in [place]? --    No data found.  Updated Vital Signs BP 126/84 (BP Location: Left Arm) Comment (BP Location): large cuff  Pulse 73   Temp 98.4 F (36.9 C) (Oral)   Resp 20   LMP 09/09/2019   SpO2 97%       Physical Exam Vitals and nursing note reviewed.  Constitutional:      General: She is not in acute distress.    Appearance: She is well-developed. She is not ill-appearing.  HENT:     Head: Normocephalic and atraumatic.     Nose: No congestion or rhinorrhea.  Mouth/Throat:     Mouth: Mucous membranes are moist.     Pharynx: Oropharyngeal exudate and posterior oropharyngeal erythema present.  Eyes:      Conjunctiva/sclera: Conjunctivae normal.  Cardiovascular:     Rate and Rhythm: Normal rate and regular rhythm.     Heart sounds: No murmur heard. Pulmonary:     Effort: Pulmonary effort is normal.     Breath sounds: Normal breath sounds. No wheezing, rhonchi or rales.  Neurological:     Mental Status: She is alert.      UC Treatments / Results  Labs (all labs ordered are listed, but only abnormal results are displayed) Labs Reviewed  CULTURE, GROUP A STREP ([place])  SARS CORONAVIRUS 2 (TAT 6-24 HRS)  POCT RAPID STREP A, ED / UC    EKG   Radiology No results found.  Procedures Procedures (including critical care time)  Medications Ordered in UC Medications - No data to display  Initial Impression / Assessment and Plan / UC Course  I have reviewed the triage vital signs and the nursing notes.  Pertinent labs & imaging results that were available during my care of the patient were reviewed by me and considered in my medical decision making (see chart for details).    Strep test negative in office.  Will order culture and given significant exudate we will also treat with antibiotics.  COVID screening also ordered.  Encouraged follow-up with any further concerns.  Final Clinical Impressions(s) / UC Diagnoses   Final diagnoses:  Pharyngitis, unspecified etiology   Discharge Instructions   None    ED Prescriptions     Medication Sig Dispense Auth. Provider   amoxicillin (AMOXIL) 500 MG capsule Take 1 capsule (500 mg total) by mouth 3 (three) times daily. 21 capsule Francene Finders, PA-C      PDMP not reviewed this encounter.   Francene Finders, PA-C 08/21/21 1204

## 2021-08-21 NOTE — ED Notes (Signed)
Throat swab placed in lab

## 2021-08-22 LAB — SARS CORONAVIRUS 2 (TAT 6-24 HRS): SARS Coronavirus 2: NEGATIVE

## 2021-08-23 LAB — CULTURE, GROUP A STREP (THRC)

## 2021-09-08 ENCOUNTER — Other Ambulatory Visit: Payer: Self-pay | Admitting: Student

## 2021-09-08 ENCOUNTER — Other Ambulatory Visit (HOSPITAL_COMMUNITY): Payer: Self-pay

## 2021-09-08 DIAGNOSIS — E785 Hyperlipidemia, unspecified: Secondary | ICD-10-CM

## 2021-09-08 DIAGNOSIS — I10 Essential (primary) hypertension: Secondary | ICD-10-CM

## 2021-09-08 MED ORDER — LOSARTAN POTASSIUM 50 MG PO TABS
50.0000 mg | ORAL_TABLET | Freq: Every day | ORAL | 2 refills | Status: DC
  Filled 2021-09-08: qty 90, 90d supply, fill #0
  Filled 2021-12-07: qty 90, 90d supply, fill #1
  Filled 2022-03-03: qty 90, 90d supply, fill #2

## 2021-09-08 MED ORDER — ROSUVASTATIN CALCIUM 20 MG PO TABS
20.0000 mg | ORAL_TABLET | Freq: Every day | ORAL | 3 refills | Status: DC
  Filled 2021-09-08: qty 90, 90d supply, fill #0
  Filled 2021-12-07: qty 90, 90d supply, fill #1
  Filled 2022-03-03: qty 90, 90d supply, fill #2
  Filled 2022-05-30 – 2022-06-14 (×2): qty 90, 90d supply, fill #3

## 2021-09-10 ENCOUNTER — Other Ambulatory Visit: Payer: Self-pay | Admitting: Obstetrics and Gynecology

## 2021-09-10 ENCOUNTER — Telehealth: Payer: Self-pay | Admitting: Physical Medicine and Rehabilitation

## 2021-09-10 DIAGNOSIS — Z1231 Encounter for screening mammogram for malignant neoplasm of breast: Secondary | ICD-10-CM

## 2021-09-10 NOTE — Telephone Encounter (Signed)
Pt called requesting a call back to set an appt. If call before 2 pm call [phone] if after 2 call pt cell at [phone]

## 2021-09-16 ENCOUNTER — Other Ambulatory Visit: Payer: Self-pay | Admitting: Student

## 2021-09-16 ENCOUNTER — Ambulatory Visit (INDEPENDENT_AMBULATORY_CARE_PROVIDER_SITE_OTHER): Payer: 59 | Admitting: Physical Medicine and Rehabilitation

## 2021-09-16 ENCOUNTER — Encounter: Payer: Self-pay | Admitting: Physical Medicine and Rehabilitation

## 2021-09-16 DIAGNOSIS — I10 Essential (primary) hypertension: Secondary | ICD-10-CM

## 2021-09-16 DIAGNOSIS — G8929 Other chronic pain: Secondary | ICD-10-CM | POA: Diagnosis not present

## 2021-09-16 DIAGNOSIS — M5116 Intervertebral disc disorders with radiculopathy, lumbar region: Secondary | ICD-10-CM

## 2021-09-16 DIAGNOSIS — M5442 Lumbago with sciatica, left side: Secondary | ICD-10-CM

## 2021-09-16 DIAGNOSIS — M5416 Radiculopathy, lumbar region: Secondary | ICD-10-CM

## 2021-09-16 NOTE — Progress Notes (Unsigned)
02.2Pt sta      te lower back pain that travels down her left leg. Pt state walking, standing and bending makes the pain meds. Pt state Pt state she taks pain meds and uses heat to help ease her pain.  Numeric Pain Rating Scale and Functional Assessment Average Pain 10 Pain Right Now 10 My pain is constant, sharp, burning, dull, stabbing, tingling, and aching Pain is worse with: walking, bending, sitting, standing, some activites, and laying down Pain improves with: heat/ice, medication, and injections   In the last MONTH (on 0-10 scale) has pain interfered with the following?  1. General activity like being  able to carry out your everyday physical activities such as walking, climbing stairs, carrying groceries, or moving a chair?  Rating(5)  2. Relation with others like being able to carry out your usual social activities and roles such as  activities at home, at work and in your community. Rating(6)  3. Enjoyment of life such that you have  been bothered by emotional problems such as feeling anxious, depressed or irritable?  Rating(7)

## 2021-09-16 NOTE — Progress Notes (Unsigned)
Alicia Obrien - 46 y.o. female MRN [id number]  Date of birth: 1975-01-14  Office Visit Note: Visit Date: 09/16/2021 PCP: Gaylan Gerold, DO Referred by: Gaylan Gerold, DO  Subjective: Chief Complaint  Patient presents with   Lower Back - Pain   Left Leg - Pain   HPI: Alicia Obrien is a 46 y.o. female who comes in today for evaluation of chronic, worsening and severe left sided lower back pain radiating to buttock and down posterior leg to foot. Patients husband called in today to schedule an appointment for himself, however this visit was actually for his wife, we were not aware until she arrived. Pain ongoing for several years and is exacerbated by movement and activity. She describes pain as sore, aching and sharp sensation, currently rates as 8 out of 10. Some relief of pain with home exercise regimen, rest and use of medications. States some relief with Naproxen and Robaxin. Lumbar MRI imaging from 2021 exhibits at L5-S1 there is broad left paracentral disc protrusion and mass effect on the left intraspinal S1 nerve root. No high grade spinal canal stenosis noted. Patient underwent left S1 transforaminal epidural steroid injection in our office in 2021, she reports significant and sustained pain relief with this procedure until recently. Patient currently works as a Technical brewer within [employer], states severe pain is negatively impacting her daily life. Patient denies focal weakness, numbness and tingling. Patient denies recent trauma or falls.    Review of Systems  Musculoskeletal:  Positive for back pain.  Neurological:  Negative for tingling, sensory change, focal weakness and weakness.  All other systems reviewed and are negative.  Otherwise per HPI.  Assessment & Plan: Visit Diagnoses:    ICD-10-CM   1. Chronic left-sided low back pain with left-sided sciatica  M54.42 Ambulatory referral to Physical Medicine Rehab   G89.29     2. Lumbar radiculopathy  M54.16 Ambulatory  referral to Physical Medicine Rehab    3. Radiculopathy due to lumbar intervertebral disc disorder  M51.16 Ambulatory referral to Physical Medicine Rehab       Plan: Findings:  Chronic, worsening and severe left sided lower back pain radiating to buttock and down posterior leg to foot. Patient continues to have severe pain despite good conservative therapies such as home exercise regimen, rest and use of medications. Patients clinical presentation and exam are consistent with S1 nerve pattern. Next step is to repeat left S1 transforaminal epidural steroid injection under fluoroscopic guidance. If good relief of pain with injection we can repeat infrequently as needed. If her pain persists post injection we did discuss possibility of surgical referral to talk about treatment options. I did discuss pain management with patient today and did perform IM injection of Toradol in the office today, she tolerated without issues. No red flag symptoms noted upon exam today.     Meds & Orders: No orders of the defined types were placed in this encounter.   Orders Placed This Encounter  Procedures   Large Joint Inj   Ambulatory referral to Physical Medicine Rehab    Follow-up: Return for Left S1 transforaminal epidural steroid injection.   Procedures: Large Joint Inj on 09/16/2021 3:34 PM Details: 25 G needle Medications: 30 mg ketorolac 30 MG/ML Outcome: tolerated well, no immediate complications  Intramuscular injection to left deltoid. Consent was given by the patient.          Clinical History: EXAM: MRI LUMBAR SPINE WITHOUT CONTRAST   TECHNIQUE: Multiplanar, multisequence MR imaging  of the lumbar spine was performed. No intravenous contrast was administered.   COMPARISON:  None.   FINDINGS: Segmentation:  Standard.   Alignment:  Physiologic.   Vertebrae:  No fracture, evidence of discitis, or bone lesion.   Conus medullaris and cauda equina: Conus extends to the L1 level. Conus  and cauda equina appear normal.   Paraspinal and other soft tissues: No acute paraspinal abnormality.   Disc levels:   Disc spaces: Degenerative disease with mild disc height loss at L5-S1.   T12-L1: No significant disc bulge. No evidence of neural foraminal stenosis. No central canal stenosis.   L1-L2: No significant disc bulge. No evidence of neural foraminal stenosis. No central canal stenosis.   L2-L3: No significant disc bulge. No evidence of neural foraminal stenosis. No central canal stenosis.   L3-L4: No significant disc bulge. No evidence of neural foraminal stenosis. No central canal stenosis.   L4-L5: No significant disc bulge. No evidence of neural foraminal stenosis. No central canal stenosis.   L5-S1: Broad-based disc bulge with a broad left paracentral disc protrusion and mass effect on the left intraspinal S1 nerve root. Mild bilateral facet arthropathy. Mild bilateral foraminal stenosis. No central canal stenosis.   IMPRESSION: 1. At L5-S1 there is a broad-based disc bulge with a broad left paracentral disc protrusion and mass effect on the left intraspinal S1 nerve root. Mild bilateral facet arthropathy. Mild bilateral foraminal stenosis.     Electronically Signed   By: Kathreen Devoid   On: 12/15/2019 17:07   She reports that she has never smoked. She has never used smokeless tobacco.  Recent Labs    12/10/20 1040 02/17/21 1556  HGBA1C 6.5* 6.0*    Objective:  VS:  HT:    WT:   BMI:     BP:   HR: bpm  TEMP: ( )  RESP:  Physical Exam Vitals and nursing note reviewed.  HENT:     Head: Normocephalic and atraumatic.     Right Ear: External ear normal.     Left Ear: External ear normal.     Nose: Nose normal.     Mouth/Throat:     Mouth: Mucous membranes are moist.  Eyes:     Extraocular Movements: Extraocular movements intact.  Cardiovascular:     Rate and Rhythm: Normal rate.     Pulses: Normal pulses.  Pulmonary:     Effort:  Pulmonary effort is normal.  Abdominal:     General: Abdomen is flat. There is no distension.  Musculoskeletal:        General: Tenderness present.     Cervical back: Normal range of motion.     Comments: Pt rises from seated position to standing without difficulty. Good lumbar range of motion. Strong distal strength without clonus, no pain upon palpation of greater trochanters. Sensation intact bilaterally. Dysesthesias noted to left S1 dermatome. Walks independently, gait steady. Positive slump test.    Skin:    General: Skin is warm and dry.     Capillary Refill: Capillary refill takes less than 2 seconds.  Neurological:     General: No focal deficit present.     Mental Status: She is alert and oriented to person, place, and time.  Psychiatric:        Mood and Affect: Mood normal.        Behavior: Behavior normal.     Ortho Exam  Imaging: No results found.  Past Medical/Family/Surgical/Social History: Medications & Allergies reviewed per EMR, new  medications updated. Patient Active Problem List   Diagnosis Date Noted   Sinus pressure 05/18/2021   Vitamin D deficiency 03/03/2021   Type 2 diabetes mellitus ([place]) 12/10/2020   Healthcare maintenance 12/10/2020   Left elbow pain 06/26/2020   Swelling of lower extremity 05/06/2020   Lumbar radiculopathy 11/27/2019   Furuncle of nose 11/19/2019   Status post laparoscopic hysterectomy 09/17/2019   Genetic testing 08/12/2019   Family history of breast cancer    Obesity 03/27/2015   Essential hypertension 01/28/2015   History of ovarian cyst 01/28/2015   Hyperlipidemia 01/28/2015   Past Medical History:  Diagnosis Date   Allergy    Anemia    younger   Constipation    Diabetes mellitus without complication (HCC)    Elevated cholesterol    Family history of breast cancer    Family history of prostate cancer    GERD (gastroesophageal reflux disease)    Heart murmur    Hypertension    Vertigo    Family History   Problem Relation Age of Onset   Diabetes Mother    Heart attack Father    Cancer Maternal Uncle        bone cancer    Heart disease Maternal Uncle    Prostate cancer Maternal Uncle 50   Breast cancer Paternal Aunt        dx early 72s   Other Paternal Aunt        has breast lumps, watching her closely   Prostate cancer Paternal Uncle 65       dx 2nd time in his 73s   Dementia Maternal Grandmother    Breast cancer Paternal Grandmother 21       d. in her 10s   Breast cancer Cousin 68       mother's maternal first cousin   Breast cancer Maternal Great-grandmother    Breast cancer Other        PGMs sister dx in her 11s   Colon polyps Neg Hx    Colon cancer Neg Hx    Past Surgical History:  Procedure Laterality Date   CYSTOSCOPY N/A 09/17/2019   Procedure: CYSTOSCOPY;  Surgeon: [name], MD;  Location: [facility];  Service: Gynecology;  Laterality: N/A;   TOTAL LAPAROSCOPIC HYSTERECTOMY WITH SALPINGECTOMY N/A 09/17/2019   Procedure: TOTAL LAPAROSCOPIC HYSTERECTOMY, BILATERAL SALPINGECTOMY;  Surgeon: [name], MD;  Location: [facility];  Service: Gynecology;  Laterality: N/A;   Social History   Occupational History   Not on file  Tobacco Use   Smoking status: Never   Smokeless tobacco: Never  Vaping Use   Vaping Use: Never used  Substance and Sexual Activity   Alcohol use: No   Drug use: No   Sexual activity: Yes

## 2021-09-17 ENCOUNTER — Other Ambulatory Visit (HOSPITAL_COMMUNITY): Payer: Self-pay

## 2021-09-17 MED ORDER — HYDROCHLOROTHIAZIDE 12.5 MG PO TABS
12.5000 mg | ORAL_TABLET | Freq: Every day | ORAL | 2 refills | Status: DC
  Filled 2021-09-17: qty 90, 90d supply, fill #0
  Filled 2021-12-07 – 2021-12-08 (×3): qty 90, 90d supply, fill #1
  Filled 2022-03-03 – 2022-03-11 (×4): qty 90, 90d supply, fill #2

## 2021-09-20 ENCOUNTER — Ambulatory Visit (INDEPENDENT_AMBULATORY_CARE_PROVIDER_SITE_OTHER): Payer: 59 | Admitting: Internal Medicine

## 2021-09-20 VITALS — BP 135/77 | HR 69 | Temp 98.4°F | Ht 68.0 in | Wt 226.7 lb

## 2021-09-20 DIAGNOSIS — Z6834 Body mass index (BMI) 34.0-34.9, adult: Secondary | ICD-10-CM | POA: Diagnosis not present

## 2021-09-20 DIAGNOSIS — E669 Obesity, unspecified: Secondary | ICD-10-CM

## 2021-09-20 DIAGNOSIS — E785 Hyperlipidemia, unspecified: Secondary | ICD-10-CM | POA: Diagnosis not present

## 2021-09-20 DIAGNOSIS — E119 Type 2 diabetes mellitus without complications: Secondary | ICD-10-CM

## 2021-09-20 DIAGNOSIS — Z803 Family history of malignant neoplasm of breast: Secondary | ICD-10-CM | POA: Diagnosis not present

## 2021-09-20 DIAGNOSIS — I10 Essential (primary) hypertension: Secondary | ICD-10-CM | POA: Diagnosis not present

## 2021-09-20 LAB — GLUCOSE, CAPILLARY: Glucose-Capillary: 125 mg/dL — ABNORMAL HIGH (ref 70–99)

## 2021-09-20 LAB — POCT GLYCOSYLATED HEMOGLOBIN (HGB A1C): Hemoglobin A1C: 6.4 % — AB (ref 4.0–5.6)

## 2021-09-20 MED ORDER — KETOROLAC TROMETHAMINE 30 MG/ML IJ SOLN
30.0000 mg | INTRAMUSCULAR | Status: AC | PRN
  Administered 2021-09-16: 30 mg via INTRA_ARTICULAR

## 2021-09-20 MED ORDER — LIRAGLUTIDE 18 MG/3ML ~~LOC~~ SOPN: PEN_INJECTOR | SUBCUTANEOUS | 2 refills | Status: DC

## 2021-09-20 NOTE — Assessment & Plan Note (Signed)
Adherent with crestor 20 mg. Last LDL 12/22 well-controlled for primary prevention at 99. A/P: Recheck lipid panel

## 2021-09-20 NOTE — Assessment & Plan Note (Signed)
She is scheduled for mammogram later this week.  Colonoscopy completed 4/23

## 2021-09-20 NOTE — Patient Instructions (Signed)
Thank you, Ms.Karilyn Cota for allowing [place] to provide your care today. Today we discussed Diabetes, blood pressure, and cholesterol.    Diabetes: We are restarting liraglutide. Start this at 0.'6mg'$  injection daily and increase dose weekly. Follow-up in 4 weeks.  Cholesterol: Rechecking this today  I have ordered the following labs for you:  Lab Orders         Glucose, capillary         Lipid Profile         POC Hbg A1C      Referrals ordered today:   Referral Orders  No referral(s) requested today     I have ordered the following medication/changed the following medications:   Stop the following medications: Medications Discontinued During This Encounter  Medication Reason   amoxicillin (AMOXIL) 500 MG capsule Completed Course   Dulaglutide (TRULICITY) 1.5 TG/2.5WL SOPN Change in therapy     Start the following medications: Meds ordered this encounter  Medications   liraglutide (VICTOZA) 18 MG/3ML SOPN    Sig: Take 0.6  mg daily for 1 week; increase by 0.6 mg daily at weekly intervals to a target dose of 3 mg once daily.    Dispense:  3 mL    Refill:  2     Follow up:  4 weeks checkin     We look forward to seeing you next time. Please call our clinic at [phone] if you have any questions or concerns. The best time to call is Monday-Friday from 9am-4pm, but there is someone available 24/7. If after hours or the weekend, call the main hospital number and ask for the Internal Medicine Resident On-Call. If you need medication refills, please notify your pharmacy one week in advance and they will send [place] a request.   Thank you for trusting me with your care. Wishing you the best!   Christiana Fuchs, [place]

## 2021-09-20 NOTE — Assessment & Plan Note (Signed)
Patient presents for follow-up on obesity. Since 2/23 she has lost about 5 lbs while taking dulaglutide. She has been watching what she is eating and decreased sugary intake. She previously took liraglutide and found this medication more helpful with weight loss. A/P: Dc dulaglutide Start liraglutide 0.6 mg QD, increase dose weekly Follow-up in 4 weeks

## 2021-09-20 NOTE — Progress Notes (Addendum)
Subjective:  CC: follow-up on diabetes, hypertension, and weight loss goals  HPI:  [name] is a 46 y.o. female with a past medical history stated below and presents today for follow-up on diabetes, hypertension, and weight loss goals. Please see problem based assessment and plan for additional details.  Past Medical History:  Diagnosis Date   Allergy    Anemia    younger   Constipation    Diabetes mellitus without complication (HCC)    Elevated cholesterol    Family history of breast cancer    Family history of prostate cancer    GERD (gastroesophageal reflux disease)    Heart murmur    Hypertension    Vertigo     Current Outpatient Medications on File Prior to Visit  Medication Sig Dispense Refill   cetirizine (ZYRTEC) 10 MG tablet Take 10 mg by mouth daily.     fluticasone (FLONASE) 50 MCG/ACT nasal spray Place 1 spray into both nostrils daily. (Patient not taking: Reported on 08/21/2021) 16 g 2   hydrochlorothiazide (HYDRODIURIL) 12.5 MG tablet Take 1 tablet (12.5 mg total) by mouth daily. 90 tablet 2   losartan (COZAAR) 50 MG tablet Take 1 tablet by mouth daily 90 tablet 2   naproxen sodium (ALEVE) 220 MG tablet Take 220 mg by mouth.     omeprazole (PRILOSEC) 20 MG capsule Take 20 mg by mouth daily.     rosuvastatin (CRESTOR) 20 MG tablet Take 1 tablet (20 mg total) by mouth daily. 90 tablet 3   Current Facility-Administered Medications on File Prior to Visit  Medication Dose Route Frequency Provider Last Rate Last Admin   betamethasone acetate-betamethasone sodium phosphate (CELESTONE) injection 12 mg  12 mg Other Once Magnus Sinning, MD        Family History  Problem Relation Age of Onset   Diabetes Mother    Heart attack Father    Cancer Maternal Uncle        bone cancer    Heart disease Maternal Uncle    Prostate cancer Maternal Uncle 50   Breast cancer Paternal Aunt        dx early 13s   Other Paternal Aunt        has breast lumps,  watching her closely   Prostate cancer Paternal Uncle 82       dx 2nd time in his 74s   Dementia Maternal Grandmother    Breast cancer Paternal Grandmother 45       d. in her 25s   Breast cancer Cousin 36       mother's maternal first cousin   Breast cancer Maternal Great-grandmother    Breast cancer Other        PGMs sister dx in her 23s   Colon polyps Neg Hx    Colon cancer Neg Hx     Social History   Socioeconomic History   Marital status: Single    Spouse name: Not on file   Number of children: 3   Years of education: 16   Highest education level: Not on file  Occupational History   Not on file  Tobacco Use   Smoking status: Never   Smokeless tobacco: Never  Vaping Use   Vaping Use: Never used  Substance and Sexual Activity   Alcohol use: No   Drug use: No   Sexual activity: Yes  Other Topics Concern   Not on file  Social History Narrative   Not on file  Social Determinants of Health   Financial Resource Strain: Not on file  Food Insecurity: Not on file  Transportation Needs: Not on file  Physical Activity: Not on file  Stress: Not on file  Social Connections: Not on file  Intimate Partner Violence: Not on file    Review of Systems: ROS negative except for what is noted on the assessment and plan.  Objective:   Vitals:   09/20/21 0835 09/20/21 0920  BP: (!) 144/79 135/77  Pulse: 84 69  Temp: 98.4 F (36.9 C)   TempSrc: Oral   SpO2: 100%   Weight: 226 lb 11.2 oz (102.8 kg)   Height: '5\' 8"'$  (1.727 m)     Physical Exam: Constitutional: well-appearing, in no acute distress Cardiovascular: regular rate and rhythm, no m/r/g Pulmonary/Chest: normal work of breathing on room air, lungs clear to auscultation bilaterally Abdominal: soft, non-tender, non-distended MSK: normal bulk and tone Neurological: alert & oriented x 3 Skin: warm and dry Psych: normal mood and affect     Assessment & Plan:  Essential hypertension Blood pressure initially  elevated at 144/79.  Repeat blood pressure 135/77.  Patient states that she is currently in pain due to her back and is being followed by another provider awaiting approval for steroid injection.  She does notice when her blood pressure is elevated she sees spots in her vision.  Denies shortness of breath or chest pain.  She is adherent with blood pressure medications including losartan 25 mg daily and hydrochlorothiazide 12.5 mg daily.  She does not check her blood pressure at home. A/P: Continue losartan 25 mg and hydrochlorothiazide 12.5 mg BMP at follow-up visit in March  Type 2 diabetes mellitus ([place]) A1c increased from 6 to 6.4 since February of this year.  She was started on Dulaglutide in February for diabetes and goal of weight loss.  She has not seen as much of an effect with dulaglutide compared to liraglutide which she took previously in 2022.  A/P: Restart liraglutide 0.6 mg daily, titrate as able Follow-up in 4 week Repeat A1c in December  Last eye exam 12/22 Continue crestor Foot exam completed  Addendum 9/13: Liraglutide resent to [place] request to change to saxenda due to medication supply.  Addendum 9/15: National back order of saxenda. I talked with Alicia Obrien and she would prefer to try victoza. -Victoza sent to [facility]  Obesity Patient presents for follow-up on obesity. Since 2/23 she has lost about 5 lbs while taking dulaglutide. She has been watching what she is eating and decreased sugary intake. She previously took liraglutide and found this medication more helpful with weight loss. A/P: Dc dulaglutide Start liraglutide 0.6 mg QD, increase dose weekly Follow-up in 4 weeks  Hyperlipidemia Adherent with crestor 20 mg. Last LDL 12/22 well-controlled for primary prevention at 99. A/P: Recheck lipid panel  Family history of breast cancer She is scheduled for mammogram later this week.  Colonoscopy completed 4/23   Patient discussed with Dr.  Hansel Starling Daymond Cordts, D.O. [facility]  PGY-2 Pager: [phone]  Phone: [phone] Date 09/24/2021  Time 12:08 PM

## 2021-09-20 NOTE — Assessment & Plan Note (Addendum)
A1c increased from 6 to 6.4 since February of this year.  She was started on Dulaglutide in February for diabetes and goal of weight loss.  She has not seen as much of an effect with dulaglutide compared to liraglutide which she took previously in 2022.  A/P: Restart liraglutide 0.6 mg daily, titrate as able Follow-up in 4 week Repeat A1c in December  Last eye exam 12/22 Continue crestor Foot exam completed  Addendum 9/13: Liraglutide resent to [facility] request to change to saxenda due to medication supply.  Addendum 9/15: National back order of saxenda. I talked with Alicia Obrien and she would prefer to try victoza. -Victoza sent to [facility]

## 2021-09-20 NOTE — Assessment & Plan Note (Signed)
Blood pressure initially elevated at 144/79.  Repeat blood pressure 135/77.  Patient states that she is currently in pain due to her back and is being followed by another provider awaiting approval for steroid injection.  She does notice when her blood pressure is elevated she sees spots in her vision.  Denies shortness of breath or chest pain.  She is adherent with blood pressure medications including losartan 25 mg daily and hydrochlorothiazide 12.5 mg daily.  She does not check her blood pressure at home. A/P: Continue losartan 25 mg and hydrochlorothiazide 12.5 mg BMP at follow-up visit in March

## 2021-09-21 ENCOUNTER — Other Ambulatory Visit (HOSPITAL_COMMUNITY): Payer: Self-pay

## 2021-09-21 ENCOUNTER — Other Ambulatory Visit: Payer: Self-pay | Admitting: Internal Medicine

## 2021-09-21 DIAGNOSIS — E119 Type 2 diabetes mellitus without complications: Secondary | ICD-10-CM

## 2021-09-21 LAB — LIPID PANEL
Chol/HDL Ratio: 4 ratio (ref 0.0–4.4)
Cholesterol, Total: 163 mg/dL (ref 100–199)
HDL: 41 mg/dL (ref >39)
LDL Chol Calc (NIH): 86 mg/dL (ref 0–99)
Triglycerides: 213 mg/dL — ABNORMAL HIGH (ref 0–149)
VLDL Cholesterol Cal: 36 mg/dL (ref 5–40)

## 2021-09-21 MED ORDER — PEN NEEDLES 32G X 4 MM MISC: 1.0000 | Freq: Every day | 3 refills | Status: DC

## 2021-09-21 MED ORDER — LIRAGLUTIDE 18 MG/3ML ~~LOC~~ SOPN: 0.6000 mg | PEN_INJECTOR | Freq: Every day | SUBCUTANEOUS | 2 refills | Status: DC

## 2021-09-21 NOTE — Telephone Encounter (Signed)
Pharmacy stated that med needed a pa but when I tried  to do a PA it stated that med was cover .Marland Kitchen After calling the Tech stated that the problem was it was on back order .Marland Kitchen I called [facility] the tech there will call and get the med transferred to them .Marland Kitchen

## 2021-09-21 NOTE — Addendum Note (Signed)
Addended by: Edwyna Perfect on: 09/21/2021 05:02 PM   Modules accepted: Orders

## 2021-09-22 ENCOUNTER — Telehealth: Payer: Self-pay

## 2021-09-22 ENCOUNTER — Telehealth: Payer: Self-pay | Admitting: *Deleted

## 2021-09-22 ENCOUNTER — Other Ambulatory Visit (HOSPITAL_COMMUNITY): Payer: Self-pay

## 2021-09-22 MED ORDER — UNIFINE PENTIPS 32G X 4 MM MISC
1.0000 | Freq: Every day | 3 refills | Status: DC
  Filled 2021-09-22 – 2022-01-28 (×5): qty 100, 90d supply, fill #0
  Filled 2022-05-30 – 2022-08-08 (×2): qty 100, 90d supply, fill #1

## 2021-09-22 MED ORDER — LIRAGLUTIDE 18 MG/3ML ~~LOC~~ SOPN
0.6000 mg | PEN_INJECTOR | Freq: Every day | SUBCUTANEOUS | 2 refills | Status: DC
  Filled 2021-09-22 (×2): qty 3, 30d supply, fill #0

## 2021-09-22 MED ORDER — SAXENDA 18 MG/3ML ~~LOC~~ SOPN
PEN_INJECTOR | SUBCUTANEOUS | 3 refills | Status: DC
  Filled 2021-09-22: qty 3, fill #0
  Filled 2021-09-22: qty 15, 30d supply, fill #0
  Filled 2021-09-23: qty 15, 35d supply, fill #0

## 2021-09-22 NOTE — Addendum Note (Signed)
Addended by: Edwyna Perfect on: 09/22/2021 01:12 PM   Modules accepted: Orders

## 2021-09-22 NOTE — Addendum Note (Signed)
Addended by: Edwyna Perfect on: 09/22/2021 11:18 AM   Modules accepted: Orders

## 2021-09-22 NOTE — Telephone Encounter (Signed)
PA  for pt [facility] ) came through on cover my meds was submitted with last office notes  and labs .Marland Kitchen Awaiting approval or denial

## 2021-09-22 NOTE — Telephone Encounter (Signed)
Call from Pharmacist need new prescription for [place] for patient to be sent to [facility].

## 2021-09-23 ENCOUNTER — Other Ambulatory Visit (HOSPITAL_COMMUNITY): Payer: Self-pay

## 2021-09-23 NOTE — Telephone Encounter (Signed)
Decision: Approved Received approval via fax from [place] approved 09/23/21-01/13/2022  Approval faxed to the pharamcy

## 2021-09-24 ENCOUNTER — Ambulatory Visit
Admission: RE | Admit: 2021-09-24 | Discharge: 2021-09-24 | Disposition: A | Discharge status: Home / Self Care | Payer: 59 | Source: Ambulatory Visit | Attending: Obstetrics and Gynecology | Admitting: Obstetrics and Gynecology

## 2021-09-24 ENCOUNTER — Other Ambulatory Visit (HOSPITAL_COMMUNITY): Payer: Self-pay

## 2021-09-24 DIAGNOSIS — Z1231 Encounter for screening mammogram for malignant neoplasm of breast: Secondary | ICD-10-CM

## 2021-09-24 MED ORDER — LIRAGLUTIDE 18 MG/3ML ~~LOC~~ SOPN
0.6000 mg | PEN_INJECTOR | Freq: Every day | SUBCUTANEOUS | 3 refills | Status: DC
  Filled 2021-09-24: qty 6, 60d supply, fill #0
  Filled 2021-10-13 – 2022-02-01 (×5): qty 3, 30d supply, fill #0

## 2021-09-24 NOTE — Addendum Note (Signed)
Addended by: Edwyna Perfect on: 09/24/2021 12:08 PM   Modules accepted: Orders

## 2021-09-24 NOTE — Progress Notes (Signed)
Internal Medicine Clinic Attending  Case discussed with Dr. Masters  At the time of the visit.  We reviewed the resident's history and exam and pertinent patient test results.  I agree with the assessment, diagnosis, and plan of care documented in the resident's note.  

## 2021-09-28 ENCOUNTER — Ambulatory Visit: Payer: 59 | Admitting: Physical Medicine and Rehabilitation

## 2021-09-30 ENCOUNTER — Other Ambulatory Visit (HOSPITAL_COMMUNITY): Payer: Self-pay

## 2021-10-04 ENCOUNTER — Telehealth: Payer: Self-pay

## 2021-10-04 NOTE — Telephone Encounter (Signed)
This patient called. She said she just found out her daughter Yaneisy Wenz is 5-[redacted] weeks pregnant and she was calling to see who Dr. Talbert Nan would recommend for her OB care.

## 2021-10-05 ENCOUNTER — Other Ambulatory Visit (HOSPITAL_COMMUNITY): Payer: Self-pay

## 2021-10-05 NOTE — Telephone Encounter (Signed)
I would recommend Dr Marvel Plan at [facility]

## 2021-10-05 NOTE — Telephone Encounter (Signed)
I called her and left detailed message in voice mail with recommendation.

## 2021-10-06 ENCOUNTER — Other Ambulatory Visit (HOSPITAL_COMMUNITY): Payer: Self-pay

## 2021-10-13 ENCOUNTER — Other Ambulatory Visit (HOSPITAL_COMMUNITY): Payer: Self-pay

## 2021-10-13 ENCOUNTER — Ambulatory Visit: Payer: Self-pay

## 2021-10-13 ENCOUNTER — Ambulatory Visit (INDEPENDENT_AMBULATORY_CARE_PROVIDER_SITE_OTHER): Payer: 59 | Admitting: Physical Medicine and Rehabilitation

## 2021-10-13 VITALS — BP 138/63 | HR 89

## 2021-10-13 DIAGNOSIS — M5416 Radiculopathy, lumbar region: Secondary | ICD-10-CM

## 2021-10-13 MED ORDER — METHYLPREDNISOLONE ACETATE 80 MG/ML IJ SUSP
40.0000 mg | Freq: Once | INTRAMUSCULAR | Status: AC
  Administered 2021-10-13: 40 mg

## 2021-10-13 NOTE — Patient Instructions (Signed)

## 2021-10-13 NOTE — Progress Notes (Signed)
Numeric Pain Rating Scale and Functional Assessment Average Pain 7   In the last MONTH (on 0-10 scale) has pain interfered with the following?  1. General activity like being  able to carry out your everyday physical activities such as walking, climbing stairs, carrying groceries, or moving a chair?  Rating( 11+ )   +Driver, -BT, -Dye Allergies.  Yes, No, No  Ibuprofen/Naproxen for pain; does not help much   Left leg radicular pain

## 2021-10-14 ENCOUNTER — Other Ambulatory Visit (HOSPITAL_COMMUNITY): Payer: Self-pay

## 2021-10-15 ENCOUNTER — Other Ambulatory Visit (HOSPITAL_COMMUNITY): Payer: Self-pay

## 2021-10-20 ENCOUNTER — Other Ambulatory Visit (HOSPITAL_COMMUNITY): Payer: Self-pay

## 2021-10-25 ENCOUNTER — Encounter: Payer: Self-pay | Admitting: Physical Medicine and Rehabilitation

## 2021-10-25 NOTE — Progress Notes (Signed)
Alicia Obrien - 46 y.o. female MRN [id number]  Date of birth: [birth year]  Office Visit Note: Visit Date: 10/13/2021 PCP: Gaylan Gerold, DO Referred by: Gaylan Gerold, DO  Subjective: No chief complaint on file.  HPI:  Alicia Obrien is a 46 y.o. female who comes in today at the request of Barnet Pall, FNP for planned Left S1-2 Lumbar Transforaminal epidural steroid injection with fluoroscopic guidance.  The patient has failed conservative care including home exercise, medications, time and activity modification.  This injection will be diagnostic and hopefully therapeutic.  Please see requesting physician notes for further details and justification.   ROS Otherwise per HPI.  Assessment & Plan: Visit Diagnoses:    ICD-10-CM   1. Lumbar radiculopathy  M54.16 XR C-ARM NO REPORT    Epidural Steroid injection    methylPREDNISolone acetate (DEPO-MEDROL) injection 40 mg      Plan: No additional findings.   Meds & Orders:  Meds ordered this encounter  Medications   methylPREDNISolone acetate (DEPO-MEDROL) injection 40 mg    Orders Placed This Encounter  Procedures   XR C-ARM NO REPORT   Epidural Steroid injection    Follow-up: Return for visit to requesting provider as needed.   Procedures: No procedures performed  S1 Lumbosacral Transforaminal Epidural Steroid Injection - Sub-Pedicular Approach with Fluoroscopic Guidance   Patient: Alicia Obrien      Date of Birth: [birth year] MRN: [id number] PCP: Gaylan Gerold, DO      Visit Date: 10/13/2021   Universal Protocol:    Date/Time: 10/16/236:16 AM  Consent Given By: the patient  Position:  PRONE  Additional Comments: Vital signs were monitored before and after the procedure. Patient was prepped and draped in the usual sterile fashion. The correct patient, procedure, and site was verified.   Injection Procedure Details:  Procedure Site One Meds Administered:  Meds ordered this encounter   Medications   methylPREDNISolone acetate (DEPO-MEDROL) injection 40 mg    Laterality: Left  Location/Site:  S1 Foramen   Needle size: 22 ga.  Needle type: Spinal  Needle Placement: Transforaminal  Findings:   -Comments: Excellent flow of contrast along the nerve, nerve root and into the epidural space.  Epidurogram: Contrast epidurogram showed no nerve root cut off or restricted flow pattern.  Procedure Details: After squaring off the sacral end-plate to get a true AP view, the C-arm was positioned so that the best possible view of the S1 foramen was visualized. The soft tissues overlying this structure were infiltrated with 2-3 ml. of 1% Lidocaine without Epinephrine.    The spinal needle was inserted toward the target using a "trajectory" view along the fluoroscope beam.  Under AP and lateral visualization, the needle was advanced so it did not puncture dura. Biplanar projections were used to confirm position. Aspiration was confirmed to be negative for CSF and/or blood. A 1-2 ml. volume of Isovue-250 was injected and flow of contrast was noted at each level. Radiographs were obtained for documentation purposes.   After attaining the desired flow of contrast documented above, a 0.5 to 1.0 ml test dose of 0.25% Marcaine was injected into each respective transforaminal space.  The patient was observed for 90 seconds post injection.  After no sensory deficits were reported, and normal lower extremity motor function was noted,   the above injectate was administered so that equal amounts of the injectate were placed at each foramen (level) into the transforaminal epidural space.   Additional Comments:  The patient tolerated  the procedure well Dressing: Band-Aid with 2 x 2 sterile gauze    Post-procedure details: Patient was observed during the procedure. Post-procedure instructions were reviewed.  Patient left the clinic in stable condition.   Clinical History: EXAM: MRI  LUMBAR SPINE WITHOUT CONTRAST   TECHNIQUE: Multiplanar, multisequence MR imaging of the lumbar spine was performed. No intravenous contrast was administered.   COMPARISON:  None.   FINDINGS: Segmentation:  Standard.   Alignment:  Physiologic.   Vertebrae:  No fracture, evidence of discitis, or bone lesion.   Conus medullaris and cauda equina: Conus extends to the L1 level. Conus and cauda equina appear normal.   Paraspinal and other soft tissues: No acute paraspinal abnormality.   Disc levels:   Disc spaces: Degenerative disease with mild disc height loss at L5-S1.   T12-L1: No significant disc bulge. No evidence of neural foraminal stenosis. No central canal stenosis.   L1-L2: No significant disc bulge. No evidence of neural foraminal stenosis. No central canal stenosis.   L2-L3: No significant disc bulge. No evidence of neural foraminal stenosis. No central canal stenosis.   L3-L4: No significant disc bulge. No evidence of neural foraminal stenosis. No central canal stenosis.   L4-L5: No significant disc bulge. No evidence of neural foraminal stenosis. No central canal stenosis.   L5-S1: Broad-based disc bulge with a broad left paracentral disc protrusion and mass effect on the left intraspinal S1 nerve root. Mild bilateral facet arthropathy. Mild bilateral foraminal stenosis. No central canal stenosis.   IMPRESSION: 1. At L5-S1 there is a broad-based disc bulge with a broad left paracentral disc protrusion and mass effect on the left intraspinal S1 nerve root. Mild bilateral facet arthropathy. Mild bilateral foraminal stenosis.     Electronically Signed   By: Kathreen Devoid   On: 12/15/2019 17:07     Objective:  VS:  HT:    WT:   BMI:     BP:138/63  HR:89bpm  TEMP: ( )  RESP:  Physical Exam Vitals and nursing note reviewed.  Constitutional:      General: She is not in acute distress.    Appearance: Normal appearance. She is not ill-appearing.   HENT:     Head: Normocephalic and atraumatic.     Right Ear: External ear normal.     Left Ear: External ear normal.  Eyes:     Extraocular Movements: Extraocular movements intact.  Cardiovascular:     Rate and Rhythm: Normal rate.     Pulses: Normal pulses.  Pulmonary:     Effort: Pulmonary effort is normal. No respiratory distress.  Abdominal:     General: There is no distension.     Palpations: Abdomen is soft.  Musculoskeletal:        General: Tenderness present.     Cervical back: Neck supple.     Right lower leg: No edema.     Left lower leg: No edema.     Comments: Patient has good distal strength with no pain over the greater trochanters.  No clonus or focal weakness.  Skin:    Findings: No erythema, lesion or rash.  Neurological:     General: No focal deficit present.     Mental Status: She is alert and oriented to person, place, and time.     Sensory: No sensory deficit.     Motor: No weakness or abnormal muscle tone.     Coordination: Coordination normal.  Psychiatric:        Mood and  Affect: Mood normal.        Behavior: Behavior normal.      Imaging: No results found.

## 2021-10-25 NOTE — Procedures (Signed)
S1 Lumbosacral Transforaminal Epidural Steroid Injection - Sub-Pedicular Approach with Fluoroscopic Guidance   Patient: Alicia Obrien      Date of Birth: 27-May-1975 MRN: [id number] PCP: Gaylan Gerold, DO      Visit Date: 10/13/2021   Universal Protocol:    Date/Time: 10/16/236:16 AM  Consent Given By: the patient  Position:  PRONE  Additional Comments: Vital signs were monitored before and after the procedure. Patient was prepped and draped in the usual sterile fashion. The correct patient, procedure, and site was verified.   Injection Procedure Details:  Procedure Site One Meds Administered:  Meds ordered this encounter  Medications   methylPREDNISolone acetate (DEPO-MEDROL) injection 40 mg    Laterality: Left  Location/Site:  S1 Foramen   Needle size: 22 ga.  Needle type: Spinal  Needle Placement: Transforaminal  Findings:   -Comments: Excellent flow of contrast along the nerve, nerve root and into the epidural space.  Epidurogram: Contrast epidurogram showed no nerve root cut off or restricted flow pattern.  Procedure Details: After squaring off the sacral end-plate to get a true AP view, the C-arm was positioned so that the best possible view of the S1 foramen was visualized. The soft tissues overlying this structure were infiltrated with 2-3 ml. of 1% Lidocaine without Epinephrine.    The spinal needle was inserted toward the target using a "trajectory" view along the fluoroscope beam.  Under AP and lateral visualization, the needle was advanced so it did not puncture dura. Biplanar projections were used to confirm position. Aspiration was confirmed to be negative for CSF and/or blood. A 1-2 ml. volume of Isovue-250 was injected and flow of contrast was noted at each level. Radiographs were obtained for documentation purposes.   After attaining the desired flow of contrast documented above, a 0.5 to 1.0 ml test dose of 0.25% Marcaine was injected into  each respective transforaminal space.  The patient was observed for 90 seconds post injection.  After no sensory deficits were reported, and normal lower extremity motor function was noted,   the above injectate was administered so that equal amounts of the injectate were placed at each foramen (level) into the transforaminal epidural space.   Additional Comments:  The patient tolerated the procedure well Dressing: Band-Aid with 2 x 2 sterile gauze    Post-procedure details: Patient was observed during the procedure. Post-procedure instructions were reviewed.  Patient left the clinic in stable condition.

## 2021-10-26 ENCOUNTER — Encounter: Payer: Self-pay | Admitting: Physical Medicine and Rehabilitation

## 2021-10-26 ENCOUNTER — Ambulatory Visit (INDEPENDENT_AMBULATORY_CARE_PROVIDER_SITE_OTHER): Payer: 59 | Admitting: Physical Medicine and Rehabilitation

## 2021-10-26 ENCOUNTER — Other Ambulatory Visit (HOSPITAL_COMMUNITY): Payer: Self-pay

## 2021-10-26 VITALS — BP 108/74 | HR 51

## 2021-10-26 DIAGNOSIS — G8929 Other chronic pain: Secondary | ICD-10-CM

## 2021-10-26 DIAGNOSIS — M5116 Intervertebral disc disorders with radiculopathy, lumbar region: Secondary | ICD-10-CM

## 2021-10-26 DIAGNOSIS — M4726 Other spondylosis with radiculopathy, lumbar region: Secondary | ICD-10-CM

## 2021-10-26 DIAGNOSIS — M5442 Lumbago with sciatica, left side: Secondary | ICD-10-CM | POA: Diagnosis not present

## 2021-10-26 DIAGNOSIS — M5416 Radiculopathy, lumbar region: Secondary | ICD-10-CM

## 2021-10-26 NOTE — Progress Notes (Unsigned)
Alicia Obrien - 46 y.o. female MRN [id number]  Date of birth: 02-03-1975  Office Visit Note: Visit Date: 10/26/2021 PCP: Gaylan Gerold, DO Referred by: Gaylan Gerold, DO  Subjective: Chief Complaint  Patient presents with   Lower Back - Pain   HPI: Alicia Obrien is a 46 y.o. female who comes in today for evaluation of chronic, worsening and severe left sided lower back pain radiating to buttock and down posterior leg to foot. Pain ongoing for several years and is exacerbated by movement and activity. She describes pain as sore, aching and sharp in nature, currently rates as 9 out of 10. Some relief of pain with home exercise regimen, rest and use of medications. States some relief with Naproxen and Robaxin. Lumbar MRI imaging from 2021 exhibits left paracentral disc protrusion at L5-S1 and mass effect on the left intraspinal S1 nerve root. No high grade spinal canal stenosis noted. Patient recently underwent left S1 transforaminal epidural steroid injection on 10/13/2021, reports greater than 50% relief of pain for 1 week with this procedure. States her pain has gradually started to increase post injection and is negatively impacting her daily life. Patient would like to discuss other treatment options today including possible surgical referral. Patient denies focal weakness, numbness and tingling. Patient denies recent trauma or falls.    Review of Systems  Musculoskeletal:  Positive for back pain.  Neurological:  Negative for tingling, sensory change, focal weakness and weakness.  All other systems reviewed and are negative.  Otherwise per HPI.  Assessment & Plan: Visit Diagnoses:    ICD-10-CM   1. Chronic left-sided low back pain with left-sided sciatica  M54.42 MR LUMBAR SPINE WO CONTRAST   G89.29 Ambulatory referral to Orthopedic Surgery    2. Lumbar radiculopathy  M54.16 MR LUMBAR SPINE WO CONTRAST    Ambulatory referral to Orthopedic Surgery    3. Radiculopathy  due to lumbar intervertebral disc disorder  M51.16 MR LUMBAR SPINE WO CONTRAST    Ambulatory referral to Orthopedic Surgery    4. Other spondylosis with radiculopathy, lumbar region  M47.26 MR LUMBAR SPINE WO CONTRAST    Ambulatory referral to Orthopedic Surgery       Plan: Findings:  Chronic, worsening and severe left sided lower back pain radiating to buttock and down posterior leg to foot. Patient continues to have severe pain despite good conservative therapies such as home exercise regimen, rest and medications. Patients clinical presentation and exam are consistent with left S1 nerve pattern. There is left paracentral disc protrusion at L5-S1 with mass effect on the left intraspinal S1 nerve root. Good short term relief with recent left S1 transforaminal epidural steroid injection. Patient does not wish to continue with lumbar injections at this time as she has not had lasting relief of pain. We did discuss obtaining new lumbar MRI imaging and surgical consultation with Dr. Ileene Rubens in our office. We feel patient would be an ideal surgical candidate for possible microdiscectomy. I also discussed medication management with patient today, we could look at trial of Gabapentin/Lyrica and/or muscle relaxer, if patient prefers chronic pain management with opioids we could refer to her primary care provider or to outside chronic pain management practice. No red flag symptoms noted upon exam today.     Meds & Orders: No orders of the defined types were placed in this encounter.   Orders Placed This Encounter  Procedures   MR LUMBAR SPINE [place]   Ambulatory referral to Orthopedic Surgery  Follow-up: Return if symptoms worsen or fail to improve.   Procedures: No procedures performed      Clinical History: EXAM: MRI LUMBAR SPINE WITHOUT CONTRAST   TECHNIQUE: Multiplanar, multisequence MR imaging of the lumbar spine was performed. No intravenous contrast was administered.    COMPARISON:  None.   FINDINGS: Segmentation:  Standard.   Alignment:  Physiologic.   Vertebrae:  No fracture, evidence of discitis, or bone lesion.   Conus medullaris and cauda equina: Conus extends to the L1 level. Conus and cauda equina appear normal.   Paraspinal and other soft tissues: No acute paraspinal abnormality.   Disc levels:   Disc spaces: Degenerative disease with mild disc height loss at L5-S1.   T12-L1: No significant disc bulge. No evidence of neural foraminal stenosis. No central canal stenosis.   L1-L2: No significant disc bulge. No evidence of neural foraminal stenosis. No central canal stenosis.   L2-L3: No significant disc bulge. No evidence of neural foraminal stenosis. No central canal stenosis.   L3-L4: No significant disc bulge. No evidence of neural foraminal stenosis. No central canal stenosis.   L4-L5: No significant disc bulge. No evidence of neural foraminal stenosis. No central canal stenosis.   L5-S1: Broad-based disc bulge with a broad left paracentral disc protrusion and mass effect on the left intraspinal S1 nerve root. Mild bilateral facet arthropathy. Mild bilateral foraminal stenosis. No central canal stenosis.   IMPRESSION: 1. At L5-S1 there is a broad-based disc bulge with a broad left paracentral disc protrusion and mass effect on the left intraspinal S1 nerve root. Mild bilateral facet arthropathy. Mild bilateral foraminal stenosis.     Electronically Signed   By: Kathreen Devoid   On: 12/15/2019 17:07   She reports that she has never smoked. She has never used smokeless tobacco.  Recent Labs    12/10/20 1040 02/17/21 1556 09/20/21 0856  HGBA1C 6.5* 6.0* 6.4*    Objective:  VS:  HT:    WT:   BMI:     BP:108/74  HR:(!) 51bpm  TEMP: ( )  RESP:  Physical Exam Vitals and nursing note reviewed.  HENT:     Head: Normocephalic and atraumatic.     Right Ear: External ear normal.     Left Ear: External ear normal.      Nose: Nose normal.     Mouth/Throat:     Mouth: Mucous membranes are moist.  Eyes:     Extraocular Movements: Extraocular movements intact.  Cardiovascular:     Rate and Rhythm: Normal rate.     Pulses: Normal pulses.  Pulmonary:     Effort: Pulmonary effort is normal.  Abdominal:     General: Abdomen is flat. There is no distension.  Musculoskeletal:        General: Tenderness present.     Cervical back: Normal range of motion.     Comments: Pt rises from seated position to standing without difficulty. Good lumbar range of motion. Strong distal strength without clonus, no pain upon palpation of greater trochanters. Sensation intact bilaterally. Dysesthesias noted to left S1 dermatome. Walks independently, gait steady. Positive slump test on the left.   Skin:    General: Skin is warm and dry.     Capillary Refill: Capillary refill takes less than 2 seconds.  Neurological:     General: No focal deficit present.     Mental Status: She is alert and oriented to person, place, and time.  Psychiatric:  Mood and Affect: Mood normal.        Behavior: Behavior normal.     Ortho Exam  Imaging: No results found.  Past Medical/Family/Surgical/Social History: Medications & Allergies reviewed per EMR, new medications updated. Patient Active Problem List   Diagnosis Date Noted   Sinus pressure 05/18/2021   Vitamin D deficiency 03/03/2021   Type 2 diabetes mellitus ([place]) 12/10/2020   Healthcare maintenance 12/10/2020   Left elbow pain 06/26/2020   Swelling of lower extremity 05/06/2020   Lumbar radiculopathy 11/27/2019   Furuncle of nose 11/19/2019   Status post laparoscopic hysterectomy 09/17/2019   Genetic testing 08/12/2019   Family history of breast cancer    Obesity 03/27/2015   Essential hypertension 01/28/2015   History of ovarian cyst 01/28/2015   Hyperlipidemia 01/28/2015   Past Medical History:  Diagnosis Date   Allergy    Anemia    younger   Constipation     Diabetes mellitus without complication (HCC)    Elevated cholesterol    Family history of breast cancer    Family history of prostate cancer    GERD (gastroesophageal reflux disease)    Heart murmur    Hypertension    Vertigo    Family History  Problem Relation Age of Onset   Diabetes Mother    Heart attack Father    Cancer Maternal Uncle        bone cancer    Heart disease Maternal Uncle    Prostate cancer Maternal Uncle 66   Breast cancer Paternal Aunt        dx early 84s   Other Paternal Aunt        has breast lumps, watching her closely   Prostate cancer Paternal Uncle 36       dx 2nd time in his 74s   Dementia Maternal Grandmother    Breast cancer Paternal Grandmother 68       d. in her 44s   Breast cancer Cousin 54       mother's maternal first cousin   Breast cancer Maternal Great-grandmother    Breast cancer Other        PGMs sister dx in her 27s   Colon polyps Neg Hx    Colon cancer Neg Hx    Past Surgical History:  Procedure Laterality Date   CYSTOSCOPY N/A 09/17/2019   Procedure: CYSTOSCOPY;  Surgeon: [name], MD;  Location: [facility];  Service: Gynecology;  Laterality: N/A;   TOTAL LAPAROSCOPIC HYSTERECTOMY WITH SALPINGECTOMY N/A 09/17/2019   Procedure: TOTAL LAPAROSCOPIC HYSTERECTOMY, BILATERAL SALPINGECTOMY;  Surgeon: [name], MD;  Location: [facility] ([place]);  Service: Gynecology;  Laterality: N/A;   Social History   Occupational History   Not on file  Tobacco Use   Smoking status: Never   Smokeless tobacco: Never  Vaping Use   Vaping Use: Never used  Substance and Sexual Activity   Alcohol use: No   Drug use: No   Sexual activity: Yes

## 2021-10-26 NOTE — Progress Notes (Unsigned)
Numeric Pain Rating Scale and Functional Assessment Average Pain 7   In the last MONTH (on 0-10 scale) has pain interfered with the following?  1. General activity like being  able to carry out your everyday physical activities such as walking, climbing stairs, carrying groceries, or moving a chair?  Rating(10)   +Driver, -BT, -Dye Allergies.  All activities make pain worse. Pain can radiate to both legs once she lies down. Ibuprofen, Tylenol, Voltaren Gel and Methocarbamol for pain

## 2021-10-29 DIAGNOSIS — G43B0 Ophthalmoplegic migraine, not intractable: Secondary | ICD-10-CM | POA: Diagnosis not present

## 2021-10-29 LAB — HM DIABETES EYE EXAM

## 2021-11-13 ENCOUNTER — Ambulatory Visit
Admission: RE | Admit: 2021-11-13 | Discharge: 2021-11-13 | Disposition: A | Discharge status: Home / Self Care | Payer: 59 | Source: Ambulatory Visit | Attending: Physical Medicine and Rehabilitation | Admitting: Physical Medicine and Rehabilitation

## 2021-11-13 DIAGNOSIS — M5127 Other intervertebral disc displacement, lumbosacral region: Secondary | ICD-10-CM | POA: Diagnosis not present

## 2021-11-13 DIAGNOSIS — M4807 Spinal stenosis, lumbosacral region: Secondary | ICD-10-CM | POA: Diagnosis not present

## 2021-11-17 ENCOUNTER — Encounter: Payer: Self-pay | Admitting: Orthopedic Surgery

## 2021-11-17 ENCOUNTER — Ambulatory Visit (INDEPENDENT_AMBULATORY_CARE_PROVIDER_SITE_OTHER): Payer: 59

## 2021-11-17 ENCOUNTER — Ambulatory Visit (INDEPENDENT_AMBULATORY_CARE_PROVIDER_SITE_OTHER): Payer: 59 | Admitting: Orthopedic Surgery

## 2021-11-17 ENCOUNTER — Telehealth: Payer: Self-pay | Admitting: Physical Medicine and Rehabilitation

## 2021-11-17 VITALS — BP 120/85 | HR 98 | Ht 68.0 in | Wt 227.0 lb

## 2021-11-17 DIAGNOSIS — M5416 Radiculopathy, lumbar region: Secondary | ICD-10-CM

## 2021-11-17 DIAGNOSIS — G8929 Other chronic pain: Secondary | ICD-10-CM

## 2021-11-17 DIAGNOSIS — M5442 Lumbago with sciatica, left side: Secondary | ICD-10-CM

## 2021-11-17 NOTE — Telephone Encounter (Signed)
I called patient this morning to discuss recent lumbar MRI results. No answer. I did leave a message for her to call back. She does have appointment with Dr. Laurance Flatten today.

## 2021-11-17 NOTE — Progress Notes (Signed)
Orthopedic Spine Surgery Office Note  Assessment: Patient is a 46 y.o. female with L5-S1 disc herniation and S1 radiculopathy that is failed to improve with conservative treatment   Plan: -Explained that initially conservative treatment is tried as a significant number of patients may experience relief with these treatment modalities. Discussed that the conservative treatments include:  -activity modification  -physical therapy  -over the counter pain medications  -medrol dosepak  -lumbar steroid injections -Patient has tried Tylenol, activity modification, steroid injection -Patient has tried conservative treatments for 2 years now.  Her first injection dates back to 2021 with Dr. Ernestina Patches.  Since she is not getting lasting relief with conservative treatments, discussed operative management in the form of an L5-S1 microdiscectomy.  I explained that her nerve is been irritated for several years now, so she may not get full relief with this surgery. -Patient will next be seen at the date of surgery  The patient has persistent symptoms related to their herniated disc. I explained that most of the time, the symptoms related to herniated disc get better with conservative treatments. This patient has tried conservative treatment now for 2 years now without any relief of her symptoms. Accordingly, discussed surgery in the form of microdiscectomy as an option for treatment.  The risks of the surgery including but not limited to recurrent disc herniation, persistent pain, dural tear, nerve root injury, spinal cord injury, infection, bleeding, fracture, instability, need for additional procedures, and death were discussed with the patient. The benefits of the surgery would be faster relief of the patient's symptoms that are due to the herniated disc which would be the leg pain. I explained that back pain relief is not the goal of the surgery and it is not reliably alleviated with this surgery. The alternatives  to surgical management were covered with the patient and included activity modification, physical therapy, over-the-counter pain medications, and injections.  All the patient's questions were answered to their satisfaction. After this discussion, the patient expressed understanding and elected to proceed with surgical intervention.   Patient expressed understanding of the plan and all questions were answered to the patient's satisfaction.   ___________________________________________________________________________   History:  Patient is a 46 y.o. female who presents today for lumbar spine.  Patient has had several years of pain that starts in her back and radiates down her left leg.  She states that radiates down the posterior aspect of her left thigh into the posterior aspect of the left leg.  It goes into the lateral and plantar aspect of the left foot.  She gets a cramping sensation down the left leg as well.  There is no pain on the right leg.  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  However it did not get significantly worse until she had a hysterectomy back in 2021.  She says she felt something pop around that time.   Weakness: Yes, feels weak in her left ankle Symptoms of imbalance: Denies Paresthesias and numbness: Denies Bowel or bladder incontinence: Denies Saddle anesthesia: Denies  Treatments tried: Tylenol, steroid injections, activity modification  Review of systems: Denies fevers and chills, night sweats, unexplained weight loss, history of cancer.  Has had pain down her leg that wakes her at night  Past medical history: Hyperlipidemia Hypertension Migraines Vertigo Diabetes (A1c-6.4 on 09/20/2021)  Allergies: Latex, amlodipine, chlorhexidine  Past surgical history:  Hysterectomy  Social history: Denies use of nicotine product (smoking, vaping, patches,  smokeless) Alcohol use: Denies Denies recreational drug  use   Physical Exam:  General: no acute distress, appears stated age Neurologic: alert, answering questions appropriately, following commands Respiratory: unlabored breathing on room air, symmetric chest rise Psychiatric: appropriate affect, normal cadence to speech   MSK (spine):  -Strength exam      Left  Right EHL    4/5  5/5 TA    5/5  5/5 GSC    4+/5  5/5 Knee extension  5/5  5/5 Hip flexion   5/5  5/5  -Sensory exam    Sensation intact to light touch in L3-S1 nerve distributions of bilateral lower extremities  -Achilles DTR: 2/4 on the left, 2/4 on the right -Patellar tendon DTR: 2/4 on the left, 2/4 on the right  -Straight leg raise: Negative -Contralateral straight leg raise: Negative -Clonus: no beats bilaterally  -Left hip exam: No pain through range of motion, negative Stinchfield -Right hip exam: No pain through range of motion, negative Stinchfield  Imaging: XR of the lumbar spine from 11/17/2021 was independently reviewed and interpreted, showing disc height loss at L5/S1. No evidence of instability on flexion/extension. No fracture or dislocation.  MRI of the lumbar spine from 11/13/2021 was independently reviewed and interpreted, showing DDD at L5-S1.  There is a disc herniation at L5-S1 as well.  No other significant stenosis.   Patient name: Alicia Obrien Patient MRN: [id number] Date of visit: 11/17/21

## 2021-11-25 ENCOUNTER — Telehealth: Payer: Self-pay | Admitting: Orthopedic Surgery

## 2021-11-25 NOTE — Telephone Encounter (Signed)
[employer] forms received. To [employer]. 

## 2021-12-07 ENCOUNTER — Other Ambulatory Visit: Payer: Self-pay

## 2021-12-07 ENCOUNTER — Other Ambulatory Visit (HOSPITAL_COMMUNITY): Payer: Self-pay

## 2021-12-09 ENCOUNTER — Encounter (HOSPITAL_COMMUNITY): Payer: Self-pay | Admitting: Orthopedic Surgery

## 2021-12-09 ENCOUNTER — Other Ambulatory Visit (HOSPITAL_COMMUNITY): Payer: Self-pay

## 2021-12-09 ENCOUNTER — Other Ambulatory Visit: Payer: Self-pay

## 2021-12-09 NOTE — Progress Notes (Signed)
Ms Alicia Obrien denies chest pain or shortness of breath.  Patient denies having any s/s of Covid in her household, also denies any known exposure to Covid.   [name]  has Type II diabetes,  patient was taking Victoza, patient is no longer taking due to $700 copay.  Patient reports that she also was taking it for weight loss.  Ms Hopes does not check CBGs .[name] states that PCP is aware that she is not taking Victoza. PCP is Dr, Gaylan Gerold.

## 2021-12-10 ENCOUNTER — Ambulatory Visit (HOSPITAL_COMMUNITY): Payer: 59

## 2021-12-10 ENCOUNTER — Ambulatory Visit (HOSPITAL_COMMUNITY): Payer: 59 | Admitting: Anesthesiology

## 2021-12-10 ENCOUNTER — Other Ambulatory Visit: Payer: Self-pay

## 2021-12-10 ENCOUNTER — Encounter (HOSPITAL_COMMUNITY)
Admission: RE | Disposition: A | Discharge status: Home / Self Care | Payer: Self-pay | Source: Home / Self Care | Attending: Orthopedic Surgery

## 2021-12-10 ENCOUNTER — Encounter (HOSPITAL_COMMUNITY): Payer: Self-pay | Admitting: Orthopedic Surgery

## 2021-12-10 ENCOUNTER — Ambulatory Visit (HOSPITAL_COMMUNITY)
Admission: RE | Admit: 2021-12-10 | Discharge: 2021-12-10 | Disposition: A | Discharge status: Home / Self Care | Payer: 59 | Attending: Orthopedic Surgery | Admitting: Orthopedic Surgery

## 2021-12-10 ENCOUNTER — Ambulatory Visit (HOSPITAL_BASED_OUTPATIENT_CLINIC_OR_DEPARTMENT_OTHER): Payer: 59 | Admitting: Anesthesiology

## 2021-12-10 DIAGNOSIS — M5126 Other intervertebral disc displacement, lumbar region: Secondary | ICD-10-CM

## 2021-12-10 DIAGNOSIS — M5416 Radiculopathy, lumbar region: Secondary | ICD-10-CM

## 2021-12-10 DIAGNOSIS — M5127 Other intervertebral disc displacement, lumbosacral region: Secondary | ICD-10-CM | POA: Insufficient documentation

## 2021-12-10 DIAGNOSIS — M5117 Intervertebral disc disorders with radiculopathy, lumbosacral region: Secondary | ICD-10-CM | POA: Diagnosis not present

## 2021-12-10 DIAGNOSIS — G709 Myoneural disorder, unspecified: Secondary | ICD-10-CM

## 2021-12-10 DIAGNOSIS — E119 Type 2 diabetes mellitus without complications: Secondary | ICD-10-CM

## 2021-12-10 DIAGNOSIS — I10 Essential (primary) hypertension: Secondary | ICD-10-CM

## 2021-12-10 DIAGNOSIS — Z981 Arthrodesis status: Secondary | ICD-10-CM | POA: Diagnosis not present

## 2021-12-10 HISTORY — PX: HEMI-MICRODISCECTOMY LUMBAR LAMINECTOMY LEVEL 1: SHX5846

## 2021-12-10 LAB — BASIC METABOLIC PANEL
Anion gap: 12 (ref 5–15)
BUN: 13 mg/dL (ref 6–20)
CO2: 21 mmol/L — ABNORMAL LOW (ref 22–32)
Calcium: 9.3 mg/dL (ref 8.9–10.3)
Chloride: 104 mmol/L (ref 98–111)
Creatinine, Ser: 0.74 mg/dL (ref 0.44–1.00)
GFR, Estimated: >60 mL/min (ref >60)
Glucose, Bld: 111 mg/dL — ABNORMAL HIGH (ref 70–99)
Potassium: 3.7 mmol/L (ref 3.5–5.1)
Sodium: 137 mmol/L (ref 135–145)

## 2021-12-10 LAB — CBC
HCT: 41.2 % (ref 36.0–46.0)
Hemoglobin: 13.7 g/dL (ref 12.0–15.0)
MCH: 28.3 pg (ref 26.0–34.0)
MCHC: 33.3 g/dL (ref 30.0–36.0)
MCV: 85.1 fL (ref 80.0–100.0)
Platelets: 169 10*3/uL (ref 150–400)
RBC: 4.84 MIL/uL (ref 3.87–5.11)
RDW: 13.5 % (ref 11.5–15.5)
WBC: 9.3 10*3/uL (ref 4.0–10.5)
nRBC: 0 % (ref 0.0–0.2)

## 2021-12-10 LAB — GLUCOSE, CAPILLARY
Glucose-Capillary: 122 mg/dL — ABNORMAL HIGH (ref 70–99)
Glucose-Capillary: 110 mg/dL — ABNORMAL HIGH (ref 70–99)
Glucose-Capillary: 126 mg/dL — ABNORMAL HIGH (ref 70–99)

## 2021-12-10 LAB — SURGICAL PCR SCREEN
MRSA, PCR: NEGATIVE
Staphylococcus aureus: NEGATIVE

## 2021-12-10 SURGERY — HEMI-MICRODISCECTOMY LUMBAR LAMINECTOMY LEVEL 1
Anesthesia: General | Site: Spine Lumbar | Laterality: Left

## 2021-12-10 MED ORDER — THROMBIN 20000 UNITS EX SOLR
CUTANEOUS | Status: DC | PRN
  Administered 2021-12-10: 20 mL via TOPICAL

## 2021-12-10 MED ORDER — HYDROMORPHONE HCL 1 MG/ML IJ SOLN
0.2500 mg | INTRAMUSCULAR | Status: DC | PRN
  Administered 2021-12-10: 0.5 mg via INTRAVENOUS

## 2021-12-10 MED ORDER — AMISULPRIDE (ANTIEMETIC) 5 MG/2ML IV SOLN
INTRAVENOUS | Status: AC
  Administered 2021-12-10: 10 mg via INTRAVENOUS
  Filled 2021-12-10: qty 4

## 2021-12-10 MED ORDER — BUPIVACAINE-EPINEPHRINE (PF) 0.25% -1:200000 IJ SOLN
INTRAMUSCULAR | Status: AC
  Filled 2021-12-10: qty 30

## 2021-12-10 MED ORDER — 0.9 % SODIUM CHLORIDE (POUR BTL) OPTIME
TOPICAL | Status: DC | PRN
  Administered 2021-12-10: 1000 mL

## 2021-12-10 MED ORDER — POVIDONE-IODINE 10 % EX SWAB
2.0000 | Freq: Once | CUTANEOUS | Status: AC
  Administered 2021-12-10: 2 via TOPICAL

## 2021-12-10 MED ORDER — LACTATED RINGERS IV SOLN: INTRAVENOUS | Status: DC

## 2021-12-10 MED ORDER — LIDOCAINE 2% (20 MG/ML) 5 ML SYRINGE
INTRAMUSCULAR | Status: AC
  Filled 2021-12-10: qty 10

## 2021-12-10 MED ORDER — SUGAMMADEX SODIUM 200 MG/2ML IV SOLN
INTRAVENOUS | Status: DC | PRN
  Administered 2021-12-10: 220 mg via INTRAVENOUS

## 2021-12-10 MED ORDER — METHYLPREDNISOLONE ACETATE 40 MG/ML IJ SUSP
INTRAMUSCULAR | Status: AC
  Filled 2021-12-10: qty 1

## 2021-12-10 MED ORDER — PHENYLEPHRINE 80 MCG/ML (10ML) SYRINGE FOR IV PUSH (FOR BLOOD PRESSURE SUPPORT)
PREFILLED_SYRINGE | INTRAVENOUS | Status: DC | PRN
  Administered 2021-12-10 (×3): 80 ug via INTRAVENOUS

## 2021-12-10 MED ORDER — LIDOCAINE 2% (20 MG/ML) 5 ML SYRINGE
INTRAMUSCULAR | Status: DC | PRN
  Administered 2021-12-10: 80 mg via INTRAVENOUS

## 2021-12-10 MED ORDER — METHYLPREDNISOLONE ACETATE 40 MG/ML IJ SUSP
INTRAMUSCULAR | Status: DC | PRN
  Administered 2021-12-10: 40 mg

## 2021-12-10 MED ORDER — BUPIVACAINE-EPINEPHRINE 0.25% -1:200000 IJ SOLN
INTRAMUSCULAR | Status: DC | PRN
  Administered 2021-12-10: 20 mL

## 2021-12-10 MED ORDER — PHENYLEPHRINE 80 MCG/ML (10ML) SYRINGE FOR IV PUSH (FOR BLOOD PRESSURE SUPPORT)
PREFILLED_SYRINGE | INTRAVENOUS | Status: AC
  Filled 2021-12-10: qty 40

## 2021-12-10 MED ORDER — ONDANSETRON HCL 4 MG PO TABS: 4.0000 mg | ORAL_TABLET | Freq: Three times a day (TID) | ORAL | 0 refills | Status: DC | PRN

## 2021-12-10 MED ORDER — ORAL CARE MOUTH RINSE
15.0000 mL | Freq: Once | OROMUCOSAL | Status: AC
  Administered 2021-12-10: 15 mL via OROMUCOSAL

## 2021-12-10 MED ORDER — ACETAMINOPHEN 500 MG PO TABS: 1000.0000 mg | ORAL_TABLET | Freq: Three times a day (TID) | ORAL | 0 refills | Status: AC

## 2021-12-10 MED ORDER — OXYCODONE HCL 5 MG PO TABS: 5.0000 mg | ORAL_TABLET | ORAL | 0 refills | Status: AC | PRN

## 2021-12-10 MED ORDER — AMISULPRIDE (ANTIEMETIC) 5 MG/2ML IV SOLN: 10.0000 mg | Freq: Once | INTRAVENOUS | Status: AC

## 2021-12-10 MED ORDER — MIDAZOLAM HCL 2 MG/2ML IJ SOLN
INTRAMUSCULAR | Status: DC | PRN
  Administered 2021-12-10: 2 mg via INTRAVENOUS

## 2021-12-10 MED ORDER — ROCURONIUM BROMIDE 10 MG/ML (PF) SYRINGE
PREFILLED_SYRINGE | INTRAVENOUS | Status: DC | PRN
  Administered 2021-12-10: 60 mg via INTRAVENOUS
  Administered 2021-12-10: 20 mg via INTRAVENOUS

## 2021-12-10 MED ORDER — FENTANYL CITRATE (PF) 250 MCG/5ML IJ SOLN
INTRAMUSCULAR | Status: AC
  Filled 2021-12-10: qty 5

## 2021-12-10 MED ORDER — EPHEDRINE 5 MG/ML INJ
INTRAVENOUS | Status: AC
  Filled 2021-12-10: qty 10

## 2021-12-10 MED ORDER — SUCCINYLCHOLINE CHLORIDE 200 MG/10ML IV SOSY
PREFILLED_SYRINGE | INTRAVENOUS | Status: DC | PRN
  Administered 2021-12-10: 120 mg via INTRAVENOUS

## 2021-12-10 MED ORDER — ROCURONIUM BROMIDE 10 MG/ML (PF) SYRINGE
PREFILLED_SYRINGE | INTRAVENOUS | Status: AC
  Filled 2021-12-10: qty 20

## 2021-12-10 MED ORDER — TRANEXAMIC ACID-NACL 1000-0.7 MG/100ML-% IV SOLN
1000.0000 mg | INTRAVENOUS | Status: AC
  Administered 2021-12-10: 1000 mg via INTRAVENOUS
  Filled 2021-12-10: qty 100

## 2021-12-10 MED ORDER — DEXAMETHASONE SODIUM PHOSPHATE 10 MG/ML IJ SOLN
10.0000 mg | INTRAMUSCULAR | Status: AC
  Administered 2021-12-10: 8 mg via INTRAVENOUS
  Filled 2021-12-10: qty 1

## 2021-12-10 MED ORDER — HYDROMORPHONE HCL 1 MG/ML IJ SOLN
INTRAMUSCULAR | Status: AC
  Filled 2021-12-10: qty 1

## 2021-12-10 MED ORDER — SUCCINYLCHOLINE CHLORIDE 200 MG/10ML IV SOSY
PREFILLED_SYRINGE | INTRAVENOUS | Status: AC
  Filled 2021-12-10: qty 10

## 2021-12-10 MED ORDER — MIDAZOLAM HCL 2 MG/2ML IJ SOLN
INTRAMUSCULAR | Status: AC
  Filled 2021-12-10: qty 2

## 2021-12-10 MED ORDER — PROPOFOL 10 MG/ML IV BOLUS
INTRAVENOUS | Status: DC | PRN
  Administered 2021-12-10: 150 mg via INTRAVENOUS

## 2021-12-10 MED ORDER — METHOCARBAMOL 750 MG PO TABS: 750.0000 mg | ORAL_TABLET | Freq: Four times a day (QID) | ORAL | 0 refills | Status: AC | PRN

## 2021-12-10 MED ORDER — ONDANSETRON HCL 4 MG/2ML IJ SOLN
INTRAMUSCULAR | Status: DC | PRN
  Administered 2021-12-10: 4 mg via INTRAVENOUS

## 2021-12-10 MED ORDER — FENTANYL CITRATE (PF) 250 MCG/5ML IJ SOLN
INTRAMUSCULAR | Status: DC | PRN
  Administered 2021-12-10 (×3): 50 ug via INTRAVENOUS
  Administered 2021-12-10: 100 ug via INTRAVENOUS

## 2021-12-10 MED ORDER — POVIDONE-IODINE 7.5 % EX SOLN: CUTANEOUS | Status: DC | PRN

## 2021-12-10 MED ORDER — CEFAZOLIN SODIUM-DEXTROSE 2-4 GM/100ML-% IV SOLN
2.0000 g | INTRAVENOUS | Status: AC
  Administered 2021-12-10: 2 g via INTRAVENOUS
  Filled 2021-12-10: qty 100

## 2021-12-10 MED ORDER — THROMBIN 20000 UNITS EX SOLR
CUTANEOUS | Status: AC
  Filled 2021-12-10: qty 20000

## 2021-12-10 MED ORDER — SENNA 8.6 MG PO TABS: 1.0000 | ORAL_TABLET | Freq: Two times a day (BID) | ORAL | 0 refills | Status: AC

## 2021-12-10 MED ORDER — CHLORHEXIDINE GLUCONATE 0.12 % MT SOLN: 15.0000 mL | Freq: Once | OROMUCOSAL | Status: DC

## 2021-12-10 MED ORDER — INSULIN ASPART 100 UNIT/ML IJ SOLN: 0.0000 [IU] | INTRAMUSCULAR | Status: DC | PRN

## 2021-12-10 SURGICAL SUPPLY — 43 items
BUR NEURO DRILL SOFT 3.0X3.8M (BURR) ×1 IMPLANT
CANISTER SUCT 3000ML PPV (MISCELLANEOUS) ×1 IMPLANT
CLSR STERI-STRIP ANTIMIC 1/2X4 (GAUZE/BANDAGES/DRESSINGS) IMPLANT
CORD BIPOLAR FORCEPS 12FT (ELECTRODE) ×1 IMPLANT
COVER MAYO STAND STRL (DRAPES) ×1 IMPLANT
COVER SURGICAL LIGHT HANDLE (MISCELLANEOUS) ×1 IMPLANT
DRAPE C-ARM 42X72 X-RAY (DRAPES) ×1 IMPLANT
DRAPE MICROSCOPE LEICA 54X105 (DRAPES) ×1 IMPLANT
DRESSING MEPILEX FLEX 4X4 (GAUZE/BANDAGES/DRESSINGS) ×1 IMPLANT
DRSG MEPILEX FLEX 4X4 (GAUZE/BANDAGES/DRESSINGS) ×1
DRSG TEGADERM 4X10 (GAUZE/BANDAGES/DRESSINGS) ×1 IMPLANT
DURAPREP 26ML APPLICATOR (WOUND CARE) ×1 IMPLANT
ELECT BLADE 4.0 EZ CLEAN MEGAD (MISCELLANEOUS) ×1
ELECT PENCIL ROCKER SW 15FT (MISCELLANEOUS) ×1 IMPLANT
ELECT REM PT RETURN 9FT ADLT (ELECTROSURGICAL) ×1
ELECTRODE BLDE 4.0 EZ CLN MEGD (MISCELLANEOUS) ×1 IMPLANT
ELECTRODE REM PT RTRN 9FT ADLT (ELECTROSURGICAL) ×1 IMPLANT
GLOVE BIO SURGEON STRL SZ7.5 (GLOVE) ×1 IMPLANT
GLOVE INDICATOR 7.5 STRL GRN (GLOVE) ×1 IMPLANT
GOWN STRL REUS W/ TWL LRG LVL3 (GOWN DISPOSABLE) ×1 IMPLANT
GOWN STRL REUS W/TWL LRG LVL3 (GOWN DISPOSABLE) ×1
GOWN STRL SURGICAL XL XLNG (GOWN DISPOSABLE) ×1 IMPLANT
KIT BASIN OR (CUSTOM PROCEDURE TRAY) ×1 IMPLANT
KIT POSITION SURG JACKSON T1 (MISCELLANEOUS) ×1 IMPLANT
KIT TURNOVER KIT B (KITS) ×1 IMPLANT
NDL 22X1.5 STRL (OR ONLY) (MISCELLANEOUS) ×1 IMPLANT
NEEDLE 22X1.5 STRL (OR ONLY) (MISCELLANEOUS) ×1 IMPLANT
NS IRRIG 1000ML POUR BTL (IV SOLUTION) ×1 IMPLANT
PACK LAMINECTOMY ORTHO (CUSTOM PROCEDURE TRAY) ×1 IMPLANT
PATTIES SURGICAL .5 X.5 (GAUZE/BANDAGES/DRESSINGS) ×1 IMPLANT
SPONGE SURGIFOAM ABS GEL 100 (HEMOSTASIS) ×1 IMPLANT
SPONGE T-LAP 4X18 ~~LOC~~+RFID (SPONGE) ×1 IMPLANT
SUT BONE WAX W31G (SUTURE) ×1 IMPLANT
SUT MNCRL+ AB 3-0 CT1 36 (SUTURE) ×1 IMPLANT
SUT MONOCRYL AB 3-0 CT1 36IN (SUTURE) ×1
SUT VIC AB 0 CT1 18XCR BRD8 (SUTURE) ×1 IMPLANT
SUT VIC AB 0 CT1 8-18 (SUTURE) ×1
SUT VIC AB 2-0 CT1 18 (SUTURE) ×1 IMPLANT
SYR BULB IRRIG 60ML STRL (SYRINGE) ×1 IMPLANT
SYR CONTROL 10ML LL (SYRINGE) ×1 IMPLANT
TOWEL GREEN STERILE (TOWEL DISPOSABLE) ×1 IMPLANT
TOWEL GREEN STERILE FF (TOWEL DISPOSABLE) ×1 IMPLANT
WATER STERILE IRR 1000ML POUR (IV SOLUTION) ×1 IMPLANT

## 2021-12-10 NOTE — Discharge Instructions (Addendum)
Orthopedic Surgery Discharge Instructions  Patient name: Alicia Obrien Procedure Performed: L5/S1 microdiscectomy Date of Surgery: 12/10/2021 Surgeon: Ileene Rubens, MD  Pre-operative Diagnosis: L5/S1 herniated disc, lumbar radiculopathy Post-operative Diagnosis: L5/S1 herniated disc, lumbar radiculopathy  Discharge Date: 12/10/2021 Discharged to: home Discharge Condition: good  Activity: There are no specific restrictions after this surgery. You do not need to wear a brace. You are encouraged to walk as much as desired. You can perform household activities such as cleaning dishes, doing laundry, vacuuming, etc. You should let pain be your guide and gradually return to full activities.   Incision Care: Your incision site has a dressing over it. That dressing should remain in place and dry at all times for a total of one week after surgery. After one week, you can remove the dressing. Underneath the dressing, you will find pieces of tape. You should leave these pieces of tape in place. They will fall off with time. Do not pick, rub, or scrub at them. Do not put cream or lotion over the surgical area. After one week and once the dressing is off, it is okay to let soap and water run over your incision. Again, do not pick, scrub, or rub at the pieces of tape when bathing. Do not submerge (e.g., take a bath, swim, go in a hot tub, etc.) until twelve weeks after surgery. There may be some bloody drainage from the incision into the dressing after surgery. This is normal. You do not need to replace the dressing. Continue to leave it in place for the one week as instructed above. Should the dressing become saturated with blood or drainage, please call the office for further instructions.   Medications: You have been prescribed oxycodone. This is a narcotic pain medication and should only be taken as prescribed. You should not drink alcohol or operate heavy machinery (including driving) while taking  this medication. The oxycodone can cause constipation as a side effect. For that reason, you have been prescribed senna. This is a laxative. You do not need to take this medication if you develop diarrhea. Should you remain constipated even while taking the senna, please use over-the-counter miralax as instructed on the packaging to promote regular bowel movements. Tylenol has been prescribed to be taken every 8 hours, which will give you additional pain relief. Robaxin is a muscle relaxer that has been prescribed to you for muscle spasm type pain. Take this medication as needed. Zofran is a medication that can help with nausea. Take this medication as needed for nausea or vomiting.  You can use over-the-counter NSAIDs (ibuprofen, Aleve, Celebrex, naproxen, meloxicam, etc.) for additional pain relief after this surgery. These medications are safe to take with the Tylenol you have been prescribed. You should not take these medications if you have or have had kidney problems or gastrointestinal ulcers. Take these medications as instructed on the packaging.   In order to set expectations for opioid prescriptions, you will only be prescribed opioids for a total of six weeks after surgery and, at two-weeks after surgery, your opioid prescription will start to tapered (decreased dosage and number of pills). If you have ongoing need for opioid medication six weeks after surgery, you will be referred to pain management. If you are already established with a provider that is giving you opioid medications, you should schedule an appointment with them for six weeks after surgery if you feel you are going to need another prescription. State law only allows for opioid prescriptions one  week at a time. If you are running out of opioid medication near the end of the week, please call the office during business hours before running out so I can send you another prescription.   You may resume any home blood thinners (warfarin,  lovenox, apixaban, plavix, xarelto, etc) 72 hours after your surgery. Take these medications as they were previously prescribed.  Driving: You should not drive while taking narcotic pain medications. You should start getting back to driving slowly and you may want to try driving in a parking lot before doing anything more.   Diet: You are safe to resume your regular diet after surgery.   Reasons to Call the Office After Surgery: You should feel free to call the office with any concerns or questions you have in the post-operative period, but you should definitely notify the office if you develop: -shortness of breath, chest pain, or trouble breathing -excessive bleeding, drainage, redness, or swelling around the surgical site -fevers, chills, or pain that is getting worse with each passing day -persistent nausea or vomiting -new weakness in either of your legs, new and worsening numbness/tingling in either leg -numbness in the groin, bowel or bladder incontinence -other concerns about your surgery  Follow Up Appointments: You should have an office appointment scheduled for approximately two weeks after surgery. If you do not remember when this appointment is or do not already have it scheduled, please call the office to schedule.   Office Information:  -Dr. Ileene Rubens -Phone number: [phone] -Address: [address]       [place],  [address]

## 2021-12-10 NOTE — Progress Notes (Signed)
Orthopedic Surgery Post-operative Progress Note  Assessment: Patient is a 46 y.o. female who is currently admitted after undergoing L5/S1 left sided hemilaminotomy and microdiscectomy (12/10/2021)   Plan: -Operative plans complete -Drain: none -Out of bed as tolerated, no brace -Activity as tolerated -Pain control -Regular diet -No chemoprophylaxis for dvt or antiplatelets for 72 hours after surgery -Anticipate discharge to home this evening  ___________________________________________________________________________   Subjective: No acute events since surgery. Recovering in PACU. Left leg pain is gone. Having back pain but medications are helping. Denies paresthesias and numbness.   Objective:  General: no acute distress, laying in bed Neurologic: alert, answering questions appropriately, following commands Respiratory: unlabored breathing, symmetric chest rise Skin: dressing clear/dry/intact  MSK (spine):  -Strength exam      Right  Left  EHL    5/5  4+/5 TA    5/5  5/5 GSC    5/5  5/5 Knee extension  5/5  5/5 Hip flexion   5/5  5/5  -Sensory exam    Sensation intact to light touch in L3-S1 nerve distributions of bilateral lower extremities   Patient name: Alicia Obrien Patient MRN: [id number] Date: 12/10/21

## 2021-12-10 NOTE — Op Note (Addendum)
Orthopedic Spine Surgery Operative Report  Procedure: L5/S1 left sided hemilaminotomy and microdiscectomy Use of intra-operative microscope  Modifier: none  Date of procedure: 12/10/2021  Patient name: Alicia Obrien MRN: [id number] DOB: Jun 02, 1975  Surgeon: Ileene Rubens, MD Assistant: None Pre-operative diagnosis: L5/S1 disc herniation with left-sided radiculopathy Post-operative diagnosis: same as above Findings: L5/S1 herniated disc with several small loose fragments  Specimens: none Anesthesia: general EBL: 96PR Complications: none Pre-incision antibiotic: ancef  Implants: none   Indication for procedure: Patient is a 46 y.o. female who presented to the office with signs and symptoms consistent with L5/S1 disc herniation and left sided radicular symptoms. The patient had tried conservative treatments that did not provide any lasting relief. As result, operative management was discussed. The pre-operative MRI showed a L5/S1 disc herniation so L5/S1 microdiscectomy and left-sided hemilaminotomy was presented as a treatment option. The risks of the surgery including but not limited to recurrent disc herniation, persistent pain, dural tear, nerve root injury, spinal cord injury, infection, bleeding, fracture, instability, need for additional procedures, blindness, heart attack, stroke, and death were discussed with the patient. The benefits of the surgery would be faster relief of the patients symptoms that are due to the herniated disc. Explained that this symptomatic relief is for leg pain and the surgery will not necessarily help any back pain. The alternatives to surgical management were covered with the patient and included activity modification, physical therapy, over-the-counter pain medications, and injections.  All the patient's questions were answered to their satisfaction. After this discussion, the patient expressed understanding and elected to proceed with surgical  intervention.  Procedure Description: The patient was met in the pre-operative holding area. The patient's identity and consent were verified. The operative site was marked. The patient's remaining questions about the surgery were answered. The patient was brought back to the operating room. General anesthesia was induced and an endotracheal tube was placed by the anesthesia staff. The patient was transferred to the prone [place] table in the prone position. All bony prominences were well padded. The head of the bed was slightly elevated and the eyes were free from compression by the face pillow. An electric razor was used to remove her hair over the lumbar region. The surgical area was cleansed with alcohol. Fluoroscopy was then brought in to check rotation on the AP image and to mark the levels on the lateral image. The patient's skin was then prepped and draped in a standard, sterile fashion. A time out was performed that identified the patient, the procedure, and the operative level. All team members agreed with what was stated in the time out.   20cc of 0.25% Marcaine with epinephrine was injected into the muscle and soft tissue in the area of the planned incision. A midline incision over the spinous processes of the previously marked levels was made and sharp dissection was continued down through the skin and dermis. Electrocautery was then used to continue the midline dissection down to the level of the spinous process. Subperiosteal dissection was performed using electrocautery to expose the lamina out lateral to the facet joint capsule on the left side. Care was taken to not violate the facet joint capsule. A lateral fluoroscopic image was taken to confirm the level. Subperiosteal dissection with electrocautery was then done to expose all the remainder of the lamina and pars interarticularis of L5. The top of the S1 lamina was also exposed with electrocautery. The operative microscope was brought in at  this time.   A  high-speed matchstick burr was used to thin the hemilamina to the level of the ligamentum flavum. The lamina was thinned with the burr to the edge of the ligamentum flavum insertion. Care was taken to leave at least 50m of pars interarticularis. A curved curette was used to develop a plane between the ligamentum and the lamina. A series of 2 and 3 Kerrison rongeurs were used to remove the thinned lamina to complete the hemilaminotomy. A curved curette was used to elevate the ligamentum flavum off of the thecal sac. A combination of Kerrison rongeurs and a pituitary were used to remove the ligamentum flavum overlying the thecal sac and nerve root in the area of the laminotomy.   A penfield was used to mobilize the nerve root. A nerve root retractor was placed into the laminotomy site and around the traversing nerve root to mobilize it medially. A broad based disc bulge was visualized deep in the surgical area. With the traversing nerve root protected and retracted, a long-handle knife was used to create an annulotomy. A pituitary was used to remove the herniated disc fragment. There were several small loose pieces of disc material. An upbiting pituitary was used to remove a couple of more disc fragments that were more medial. A nerve hook was placed into the annulotomy to attempt to free any other loose fragments. There were no other loose fragments. A penfield 4 was used to palpate the disc space and there was no remaining herniation. The disc was in line with the posterior vertebral bodies. A lateral fluoroscopic image was taken with the penfield in to demonstrate that the area visualized and that the disc had been removed to be flush with the posterior aspect of the vertebral bodies.   The wound was copiously irrigated with sterile saline. The fascia was reapproximated with 0 vicryl suture. The subcutaneous fat was reapproximated with 0 vicryl suture. The deep dermal layer was reapproximated  with 2-0 viryl. The skin as closed with a 3-0 running moncryl. All counts were correct at the end of the case. No CSF was seen at any point during the case. The incision was dressed with steri strips and benzoine. A mepilex dressing was placed over the wound. The patient was transferred back to a bed and brought to the post-anesthesia care unit by anesthesia staff in stable condition.   Post-operative plan: The patient will recover in the post-anesthesia care unit with a plan to go home after recovering from anesthesia. The patient will be out of bed as tolerated with no brace. The patient will be seen in the office approximately 2 weeks from the date of surgery.   MIleene Rubens MD Orthopedic Surgeon

## 2021-12-10 NOTE — Discharge Summary (Signed)
Orthopedic Surgery Discharge Summary  Patient name: Alicia Obrien Patient MRN: [id number] Date: 12/10/21  Attending physician: Ileene Rubens, MD Final diagnosis: L5/S1 herniated disc with radiculopathy Findings: L5/S1 herniated disc with several small loose fragments   Hospital course: Patient is a 46 y.o. female who came to the hospital for a L5/S1 microdiscectomy. She had symptoms of radiculopathy that were refractory to conservative treatments. Plan was for outpatient surgery at the start of the surgery. After the L5/S1 microdiscectomy, she was brought back to the PACU in stable condition. The patient had significant pain immediately after surgery, but pain eventually was controlled with a multimodal regimen including oxycodone. She was able to ambulate the halls independently without assistive device. She did not have any headaches. Her leg pain had improved after surgery. She was tolerated PO fluids without issue. She was in good condition so she was discharged to home on post-operative day #0.   Instructions:   Orthopedic Surgery Discharge Instructions  Patient name: Alicia Obrien Procedure Performed: L5/S1 microdiscectomy Date of Surgery: 12/10/2021 Surgeon: Ileene Rubens, MD  Pre-operative Diagnosis: L5/S1 herniated disc, lumbar radiculopathy Post-operative Diagnosis: L5/S1 herniated disc, lumbar radiculopathy  Discharge Date: 12/10/2021 Discharged to: home Discharge Condition: good  Activity: There are no specific restrictions after this surgery. You do not need to wear a brace. You are encouraged to walk as much as desired. You can perform household activities such as cleaning dishes, doing laundry, vacuuming, etc. You should let pain be your guide and gradually return to full activities.   Incision Care: Your incision site has a dressing over it. That dressing should remain in place and dry at all times for a total of one week after surgery. After one week, you  can remove the dressing. Underneath the dressing, you will find pieces of tape. You should leave these pieces of tape in place. They will fall off with time. Do not pick, rub, or scrub at them. Do not put cream or lotion over the surgical area. After one week and once the dressing is off, it is okay to let soap and water run over your incision. Again, do not pick, scrub, or rub at the pieces of tape when bathing. Do not submerge (e.g., take a bath, swim, go in a hot tub, etc.) until twelve weeks after surgery. There may be some bloody drainage from the incision into the dressing after surgery. This is normal. You do not need to replace the dressing. Continue to leave it in place for the one week as instructed above. Should the dressing become saturated with blood or drainage, please call the office for further instructions.   Medications: You have been prescribed oxycodone. This is a narcotic pain medication and should only be taken as prescribed. You should not drink alcohol or operate heavy machinery (including driving) while taking this medication. The oxycodone can cause constipation as a side effect. For that reason, you have been prescribed senna. This is a laxative. You do not need to take this medication if you develop diarrhea. Should you remain constipated even while taking the senna, please use over-the-counter miralax as instructed on the packaging to promote regular bowel movements. Tylenol has been prescribed to be taken every 8 hours, which will give you additional pain relief. Robaxin is a muscle relaxer that has been prescribed to you for muscle spasm type pain. Take this medication as needed. Zofran is a medication that can help with nausea. Take this medication as needed for nausea or vomiting.  You can use over-the-counter NSAIDs (ibuprofen, Aleve, Celebrex, naproxen, meloxicam, etc.) for additional pain relief after this surgery. These medications are safe to take with the Tylenol you have  been prescribed. You should not take these medications if you have or have had kidney problems or gastrointestinal ulcers. Take these medications as instructed on the packaging.   In order to set expectations for opioid prescriptions, you will only be prescribed opioids for a total of six weeks after surgery and, at two-weeks after surgery, your opioid prescription will start to tapered (decreased dosage and number of pills). If you have ongoing need for opioid medication six weeks after surgery, you will be referred to pain management. If you are already established with a provider that is giving you opioid medications, you should schedule an appointment with them for six weeks after surgery if you feel you are going to need another prescription. State law only allows for opioid prescriptions one week at a time. If you are running out of opioid medication near the end of the week, please call the office during business hours before running out so I can send you another prescription.   You may resume any home blood thinners (warfarin, lovenox, apixaban, plavix, xarelto, etc) 72 hours after your surgery. Take these medications as they were previously prescribed.  Driving: You should not drive while taking narcotic pain medications. You should start getting back to driving slowly and you may want to try driving in a parking lot before doing anything more.   Diet: You are safe to resume your regular diet after surgery.   Reasons to Call the Office After Surgery: You should feel free to call the office with any concerns or questions you have in the post-operative period, but you should definitely notify the office if you develop: -shortness of breath, chest pain, or trouble breathing -excessive bleeding, drainage, redness, or swelling around the surgical site -fevers, chills, or pain that is getting worse with each passing day -persistent nausea or vomiting -new weakness in either of your legs, new and  worsening numbness/tingling in either leg -numbness in the groin, bowel or bladder incontinence -other concerns about your surgery  Follow Up Appointments: You should have an office appointment scheduled for approximately two weeks after surgery. If you do not remember when this appointment is or do not already have it scheduled, please call the office to schedule.   Office Information:  -Dr. Ileene Rubens -Phone number: [phone] -Address: [address]       [place], [place] [address]

## 2021-12-10 NOTE — Brief Op Note (Signed)
12/10/2021  4:53 PM  PATIENT:  Alicia Obrien  46 y.o. female  PRE-OPERATIVE DIAGNOSIS:  L5-S1 HERNIATED DISC  POST-OPERATIVE DIAGNOSIS:  L5-S1 HERNIATED DISC  PROCEDURE:  Procedure(s): L5-S1 LEFT SIDED HEMILAMINECTOMY AND DISCECTOMY (Left)  SURGEON:  Surgeon(s) and Role:    Callie Fielding, MD - Primary  PHYSICIAN ASSISTANT:   ASSISTANTS: none   ANESTHESIA:   general  EBL:  50 mL   BLOOD ADMINISTERED:none  DRAINS: none   LOCAL MEDICATIONS USED:  marcaine with epinephrine  SPECIMEN:  No Specimen  DISPOSITION OF SPECIMEN:  N/A  COUNTS:  YES  TOURNIQUET:  * No tourniquets in log *  DICTATION: .Note written in EPIC  PLAN OF CARE: Discharge to home after PACU  PATIENT DISPOSITION:  PACU - hemodynamically stable.   Delay start of Pharmacological VTE agent (>24hrs) due to surgical blood loss or risk of bleeding: yes

## 2021-12-10 NOTE — Anesthesia Procedure Notes (Addendum)
Procedure Name: Intubation Date/Time: 12/10/2021 1:30 PM  Performed by: Michele Rockers, CRNAPre-anesthesia Checklist: Patient identified, Patient being monitored, Timeout performed, Emergency Drugs available and Suction available Patient Re-evaluated:Patient Re-evaluated prior to induction Oxygen Delivery Method: Circle System Utilized Preoxygenation: Pre-oxygenation with 100% oxygen Induction Type: IV induction, Rapid sequence and Cricoid Pressure applied Ventilation: Mask ventilation without difficulty Laryngoscope Size: Miller and 2 Grade View: Grade I Tube type: Oral Tube size: 7.0 mm Number of attempts: 1 Airway Equipment and Method: Stylet Placement Confirmation: ETT inserted through vocal cords under direct vision, positive ETCO2 and breath sounds checked- equal and bilateral Secured at: 21 cm Tube secured with: Tape Dental Injury: Teeth and Oropharynx as per pre-operative assessment

## 2021-12-10 NOTE — Transfer of Care (Signed)
Immediate Anesthesia Transfer of Care Note  Patient: Alicia Obrien  Procedure(s) Performed: L5-S1 LEFT SIDED HEMILAMINECTOMY AND DISCECTOMY (Left: Spine Lumbar)  Patient Location: PACU  Anesthesia Type:General  Level of Consciousness: awake, alert , and oriented  Airway & Oxygen Therapy: Patient Spontanous Breathing and Patient connected to face mask oxygen  Post-op Assessment: Report given to RN, Post -op Vital signs reviewed and stable, and Patient moving all extremities  Post vital signs: Reviewed and stable  Last Vitals:  Vitals Value Taken Time  BP 120/63 12/10/21 1647  Temp    Pulse 70 12/10/21 1649  Resp 21 12/10/21 1649  SpO2 98 % 12/10/21 1649  Vitals shown include unvalidated device data.  Last Pain:  Vitals:   12/10/21 1024  TempSrc:   PainSc: 5       Patients Stated Pain Goal: 0 (37/09/64 3838)  Complications: No notable events documented.

## 2021-12-10 NOTE — Anesthesia Preprocedure Evaluation (Addendum)
Anesthesia Evaluation  Patient identified by MRN, date of birth, ID band Patient awake    Reviewed: Allergy & Precautions, NPO status , Patient's Chart, lab work & pertinent test results  Airway Mallampati: II       Dental   Pulmonary neg pulmonary ROS   breath sounds clear to auscultation       Cardiovascular hypertension, + Valvular Problems/Murmurs  Rhythm:Regular Rate:Normal     Neuro/Psych  Headaches  Neuromuscular disease    GI/Hepatic Neg liver ROS,GERD  ,,  Endo/Other  diabetes    Renal/GU negative Renal ROS     Musculoskeletal   Abdominal   Peds  Hematology   Anesthesia Other Findings   Reproductive/Obstetrics                             Anesthesia Physical Anesthesia Plan  ASA: 3  Anesthesia Plan: General   Post-op Pain Management: Tylenol PO (pre-op)*   Induction:   PONV Risk Score and Plan: 3 and Ondansetron, Dexamethasone and Midazolam  Airway Management Planned: Oral ETT  Additional Equipment:   Intra-op Plan:   Post-operative Plan: Extubation in OR  Informed Consent: I have reviewed the patients History and Physical, chart, labs and discussed the procedure including the risks, benefits and alternatives for the proposed anesthesia with the patient or authorized representative who has indicated his/her understanding and acceptance.     Dental advisory given  Plan Discussed with: CRNA and Anesthesiologist  Anesthesia Plan Comments:        Anesthesia Quick Evaluation

## 2021-12-10 NOTE — H&P (Addendum)
Orthopedic Spine Surgery H&P Note  Assessment: Patient is a 46 y.o. female with L5/S1 herniated disc and radiculopathy   Plan: -Out of bed as tolerated, activity as tolerated, no brace -Covered the risks of surgery one more time with the patient and patient elected to proceed with planned surgery -Written consent verified -Hold anticoagulation in anticipation of surgery -Ancef on all to OR -NPO for procedure -Site marked -To OR when ready  The patient has developed symptoms related to their herniated disc. I explained that most of the time, the symptoms related to herniated disc get Obrien with conservative treatments. This patient has tried conservative treatment now for over 6 weeks without any relief of their symptoms. Accordingly, discussed surgery in the form of L5/s1 microdiscectomy as an option for treatment.  The risks of the surgery including but not limited to recurrent disc herniation, persistent pain, dural tear, nerve root injury, spinal cord injury, infection, bleeding, fracture, instability, need for additional procedures, blindness, heart attack, stroke, and death were discussed with the patient. The benefits of the surgery would be faster relief of the patient's symptoms that are due to the herniated disc which would be the leg pain. I explained that back pain relief is not the goal of the surgery and it is not reliably alleviated with this surgery. The alternatives to surgical management were covered with the patient and included activity modification, physical therapy, over-the-counter pain medications, and injections.  All the patient's questions were answered to their satisfaction. After this discussion, the patient expressed understanding and elected to proceed with surgical intervention.    ___________________________________________________________________________  Chief Complaint: left leg pain  History: Patient is 46 y.o. female who has been previously seen in the  office for low back pain that radiates down the posterior aspect of her leg. Work up was consistent with an S1 radiculopathy. These symptoms failed to improve with conservative treatment so operative management was discussed at the last office visit. The patient presents today with no changes in their symptoms since the last office visit. See previous office note for further details.    Review of systems: General: denies fevers and chills, myalgias Neurologic: denies recent changes in vision, slurred speech Abdomen: denies nausea, vomiting, hematemesis Respiratory: denies cough, shortness of breath  Past medical history: Hyperlipidemia Hypertension Migraines Vertigo Diabetes (A1c-6.4 on 09/20/2021)   Allergies: Latex, amlodipine, chlorhexidine   Past surgical history:  Hysterectomy   Social history: Denies use of nicotine product (smoking, vaping, patches, smokeless) Alcohol use: Denies Denies recreational drug use  Family history: -reviewed and not pertinent to disc herniation   Physical Exam:  General: no acute distress, appears stated age Neurologic: alert, answering questions appropriately, following commands Cardiovascular: regular rate, no cyanosis Respiratory: unlabored breathing on room air, symmetric chest rise Psychiatric: appropriate affect, normal cadence to speech   MSK (spine):  -Strength exam      Left  Right  EHL    4/5  5/5 TA    5/5  5/5 GSC    4+/5  5/5 Knee extension  5/5  5/5 Knee flexion   5/5  5/5 Hip flexion   5/5  5/5  -Sensory exam    Sensation intact to light touch in L3-S1 nerve distributions of bilateral lower extremities   Patient name: Alicia Obrien Patient MRN: [id number] Date: 12/10/21

## 2021-12-11 ENCOUNTER — Other Ambulatory Visit: Payer: Self-pay

## 2021-12-13 ENCOUNTER — Encounter (HOSPITAL_COMMUNITY): Payer: Self-pay | Admitting: Orthopedic Surgery

## 2021-12-13 ENCOUNTER — Encounter: Payer: Self-pay | Admitting: Orthopedic Surgery

## 2021-12-16 ENCOUNTER — Telehealth: Payer: Self-pay | Admitting: Orthopedic Surgery

## 2021-12-16 NOTE — Telephone Encounter (Signed)
[employer] forms received. To [employer]. ?

## 2021-12-18 ENCOUNTER — Other Ambulatory Visit (HOSPITAL_COMMUNITY): Payer: Self-pay

## 2021-12-18 LAB — HM DIABETES EYE EXAM

## 2021-12-20 NOTE — Anesthesia Postprocedure Evaluation (Signed)
Anesthesia Post Note  Patient: Alicia Obrien  Procedure(s) Performed: L5-S1 LEFT SIDED HEMILAMINECTOMY AND DISCECTOMY (Left: Spine Lumbar)     Patient location during evaluation: PACU Anesthesia Type: General Level of consciousness: awake Pain management: pain level controlled Vital Signs Assessment: post-procedure vital signs reviewed and stable Respiratory status: spontaneous breathing Cardiovascular status: stable Postop Assessment: no apparent nausea or vomiting Anesthetic complications: no   No notable events documented.  Last Vitals:  Vitals:   12/10/21 1730 12/10/21 1745  BP: 116/72 118/72  Pulse: 74 71  Resp: 18 20  Temp:  37.1 C  SpO2: 94% 94%    Last Pain:  Vitals:   12/10/21 1715  TempSrc:   PainSc: Asleep                 Orpheus [place]

## 2021-12-21 ENCOUNTER — Other Ambulatory Visit (HOSPITAL_COMMUNITY): Payer: Self-pay

## 2021-12-24 ENCOUNTER — Ambulatory Visit (INDEPENDENT_AMBULATORY_CARE_PROVIDER_SITE_OTHER): Payer: 59 | Admitting: Orthopedic Surgery

## 2021-12-24 ENCOUNTER — Other Ambulatory Visit (HOSPITAL_COMMUNITY): Payer: Self-pay

## 2021-12-24 DIAGNOSIS — M5116 Intervertebral disc disorders with radiculopathy, lumbar region: Secondary | ICD-10-CM

## 2021-12-24 MED ORDER — OXYCODONE HCL 5 MG PO TABS
5.0000 mg | ORAL_TABLET | Freq: Four times a day (QID) | ORAL | 0 refills | Status: AC | PRN
  Filled 2021-12-24 – 2021-12-27 (×3): qty 25, 7d supply, fill #0

## 2021-12-24 NOTE — Progress Notes (Signed)
Orthopedic Surgery Office Progress Note  Assessment: Patient is a 46 y.o. female who is underwent left sided hemilaminotomy and microdiscectomy at L5/S1. Doing well after surgery Date of surgery: 12/10/2021 (~2 weeks from surgery)  Plan: -Operative plans complete -No activity restrictions, except do not submerge the wound until at least 6 weeks after surgery -Out of bed as tolerated, no brace -No bending/lifting/twisting greater than 10 pounds -PT evaluate and treat -Start weaning on narcotics, use more OTC medications -Follow up in 4 weeks; XR at next visit: none  ___________________________________________________________________________   Subjective: Has been doing well since surgery. Left leg pain is much better. Pleased with the surgery at this point. Has been weaning down on the pain medications as the pain from surgery is starting to feel better. Denies paresthesias and numbness. Has not noticed any redness or drainage from the incision.   Objective:  General: no acute distress, appropriate affect Neurologic: alert, answering questions appropriately, following commands Respiratory: unlabored breathing on room air Skin: incision well approximated and healing as expected, no active or expressible drainage, no erythema, no TTP around the incision  MSK (spine):  -Strength exam      Right  Left  EHL    5/5  4+/5 TA    5/5  5/5 GSC    5/5  5/5 Knee extension  5/5  5/5 Hip flexion   5/5  5/5  -Sensory exam    Sensation intact to light touch in L3-S1 nerve distributions of bilateral lower extremities   Patient name: Alicia Obrien Patient MRN: [id number] Date: 12/24/21

## 2021-12-27 ENCOUNTER — Other Ambulatory Visit (HOSPITAL_COMMUNITY): Payer: Self-pay

## 2021-12-27 ENCOUNTER — Other Ambulatory Visit: Payer: Self-pay

## 2022-01-12 ENCOUNTER — Telehealth: Payer: Self-pay

## 2022-01-12 NOTE — Telephone Encounter (Signed)
Prior Authorization for patient([employer]) came through on cover my meds wa submitted with last office notes awaiting approval or denial

## 2022-01-13 NOTE — Telephone Encounter (Addendum)
Decision:Approved Alicia Obrien (Key: [id number]) Saxenda '18MG'$ /3ML pen-injectors Form [employer] ePA Form 2017 NCPDP Created  Your prior authorization for Alicia Obrien has been approved! MORE INFO For eligible patients, copay assistance may be available. To learn more and be redirected to the [employer] website, click on the "More Info" button to the right. Please also note that you may need to schedule a follow-up visit with your patient prior to the expiration of this prior authorization, as updated patient weight may be required for reauthorization.  Message from plan: The request has been approved. The authorization is effective from 01/12/2022 to 06/01/2022, as long as the member is enrolled in their current health plan. The request was reviewed and approved by a licensed clinical pharmacist. A written notification letter will follow with additional details.  Pharmacy is aware of approval.

## 2022-01-20 ENCOUNTER — Other Ambulatory Visit (HOSPITAL_COMMUNITY): Payer: Self-pay

## 2022-01-21 ENCOUNTER — Other Ambulatory Visit (HOSPITAL_COMMUNITY): Payer: Self-pay

## 2022-01-21 ENCOUNTER — Encounter (HOSPITAL_COMMUNITY): Payer: Self-pay

## 2022-01-21 ENCOUNTER — Other Ambulatory Visit: Payer: Self-pay

## 2022-01-24 ENCOUNTER — Other Ambulatory Visit (HOSPITAL_COMMUNITY): Payer: Self-pay

## 2022-01-24 ENCOUNTER — Telehealth: Payer: Self-pay

## 2022-01-24 MED ORDER — METHYLPREDNISOLONE 4 MG PO TBPK
ORAL_TABLET | ORAL | 0 refills | Status: DC
  Filled 2022-01-24: qty 21, 6d supply, fill #0

## 2022-01-24 NOTE — Telephone Encounter (Signed)
Patient called triage. She had surgery with Dr.Moore on 12/10/2021. She woke up this morning with a lot of left leg nerve pain. She took Aleve this morning at 5am, and it is not helping. Please call patient back asap. [phone]

## 2022-01-24 NOTE — Telephone Encounter (Signed)
Orthopedic Telephone Call  Patient noticed onset of left leg pain after going to the restroom last night. Pain is felt radiating down her left leg. Not having any weakness. No change in bowel/bladder habits. Prescribed medrol dosepak. I am due to see her this Thursday so I will check in then to see how she is doing unless there is a change in symptoms, then I may have to see her earlier.   Callie Fielding, MD Orthopedic Surgeon

## 2022-01-26 ENCOUNTER — Other Ambulatory Visit: Payer: Self-pay

## 2022-01-27 ENCOUNTER — Encounter: Payer: Self-pay | Admitting: Pharmacist

## 2022-01-27 ENCOUNTER — Other Ambulatory Visit: Payer: Self-pay

## 2022-01-27 ENCOUNTER — Ambulatory Visit (INDEPENDENT_AMBULATORY_CARE_PROVIDER_SITE_OTHER): Payer: 59 | Admitting: Orthopedic Surgery

## 2022-01-27 DIAGNOSIS — Z9889 Other specified postprocedural states: Secondary | ICD-10-CM

## 2022-01-27 NOTE — Progress Notes (Signed)
Orthopedic Surgery Office Progress Note   Assessment: Patient is a 47 y.o. female who is underwent left sided hemilaminotomy and microdiscectomy at L5/S1. Doing well Date of surgery: 12/10/2021 (~6 weeks from surgery)   Plan: -Operative plans complete -Okay to submerge wound -Complete medrol dosepak -May have to do further work up if pain returns, particularly if it is radiating down the back of the leg again -Out of bed as tolerated, no brace -No bending/lifting/twisting greater than 10 pounds -PT evaluate and treat -Follow up in 6 weeks; XR at next visit: lumbar AP/lateral/flex/ex   ___________________________________________________________________________     Subjective: On Monday (01/24/2022), noted acute flare of pain that radiated down her left leg similar to before surgery. Had been doing well until that time. Did not do anything out of the ordinary that preceded the pain. I prescribed a medrol dosepak on 01/24/2022. Since then, she is doing well. Not having any pain. She is still taking the steroids. She is looking to return to work. Denies paresthesias and numbness. No redness or drainage from the incision.    Objective:   General: no acute distress, appropriate affect Neurologic: alert, answering questions appropriately, following commands Respiratory: unlabored breathing on room air Skin: incision well healed with no redness, drainage, induration   MSK (spine):   -Strength exam                                                   Right                Left   EHL                              5/5                  5/5 TA                                 5/5                  5/5 GSC                             5/5                  5/5 Knee extension            5/5                  5/5 Hip flexion                    5/5                  5/5   -Sensory exam                           Sensation intact to light touch in L3-S1 nerve distributions of bilateral lower extremities      Patient name: Alicia Obrien Patient MRN: [id number] Date: 01/27/22

## 2022-01-28 ENCOUNTER — Other Ambulatory Visit (HOSPITAL_COMMUNITY): Payer: Self-pay

## 2022-01-28 ENCOUNTER — Other Ambulatory Visit: Payer: Self-pay

## 2022-02-01 ENCOUNTER — Other Ambulatory Visit (HOSPITAL_COMMUNITY): Payer: Self-pay

## 2022-02-01 NOTE — Telephone Encounter (Signed)
Patient stated there is something wrong with her Rx for Victoza. Spoke with Judson Roch at [facility]. States there had been a rejection on the Rx but she was able to fix it. She will get the refill ready now. Co pay is $100. Patient notified and is very Patent attorney.

## 2022-02-02 ENCOUNTER — Telehealth: Payer: Self-pay

## 2022-02-02 NOTE — Telephone Encounter (Signed)
PA for saxdena was approved, can you restart this medication. [facility]. Victoza was $100.00 copay for one pen. Please change this medication for pt.

## 2022-02-02 NOTE — Telephone Encounter (Signed)
PA  for  saxenda  was completed   AS DECISION :Your prior authorization for Kirke Shaggy has been approved! MORE INFO For eligible patients, copay assistance may be available. To learn more and be redirected to the [place] website, click on the "More Info" button to the right. Please also note that you may need to schedule a follow-up visit with your patient prior to the expiration of this prior authorization, as updated patient weight may be required for reauthorization.  Message from plan: The request has been approved. The authorization is effective from 02/02/2022 to 05/25/2022, as long as the member is enrolled in their current health plan. The request was approved as submitted. This request has been approved for 59m per 30 days. A written notification letter will follow with additional details.

## 2022-02-03 ENCOUNTER — Other Ambulatory Visit (HOSPITAL_COMMUNITY): Payer: Self-pay

## 2022-02-03 NOTE — Telephone Encounter (Signed)
PA for Victoza  was submitted   DECISION :  AS Alicia Obrien (Key: [id number]) PA Case ID #: [id number] Need Help? Call us at [phone] Outcome Approved on January 24 The request has been approved. The authorization is effective from 02/02/2022 to 02/02/2023, as long as the member is enrolled in their current health plan. The request was approved as submitted. This request is approved for up to 66m per month. A written notification letter will follow with additional details. Authorization Expiration Date: 02/01/2023

## 2022-02-05 ENCOUNTER — Other Ambulatory Visit: Payer: Self-pay

## 2022-02-05 ENCOUNTER — Other Ambulatory Visit: Payer: Self-pay | Admitting: Internal Medicine

## 2022-02-05 DIAGNOSIS — E119 Type 2 diabetes mellitus without complications: Secondary | ICD-10-CM

## 2022-02-05 DIAGNOSIS — E669 Obesity, unspecified: Secondary | ICD-10-CM

## 2022-02-05 MED ORDER — SAXENDA 18 MG/3ML ~~LOC~~ SOPN
PEN_INJECTOR | SUBCUTANEOUS | 3 refills | Status: DC
  Filled 2022-02-05: qty 15, 30d supply, fill #0

## 2022-02-05 NOTE — Progress Notes (Signed)
Victoza was $100 at [facility]. Patient requests Saxenda be sent in to see if price is more affordable. PA has been completed and approved/ P: -Saxenda- 0.6 mg qd for 1 week; increase by 0.6 mg daily at weekly intervals to a target dose of 3 mg once daily. -Victoza discontinued

## 2022-02-07 ENCOUNTER — Other Ambulatory Visit (HOSPITAL_COMMUNITY): Payer: Self-pay

## 2022-02-22 ENCOUNTER — Other Ambulatory Visit (HOSPITAL_COMMUNITY): Payer: Self-pay

## 2022-02-23 ENCOUNTER — Other Ambulatory Visit (HOSPITAL_COMMUNITY): Payer: Self-pay

## 2022-02-23 ENCOUNTER — Encounter: Payer: Self-pay | Admitting: Internal Medicine

## 2022-02-23 ENCOUNTER — Other Ambulatory Visit: Payer: Self-pay

## 2022-02-23 ENCOUNTER — Ambulatory Visit (INDEPENDENT_AMBULATORY_CARE_PROVIDER_SITE_OTHER): Payer: 59 | Admitting: Internal Medicine

## 2022-02-23 VITALS — BP 127/78 | HR 77 | Temp 98.7°F | Ht 69.0 in | Wt 228.3 lb

## 2022-02-23 DIAGNOSIS — F4310 Post-traumatic stress disorder, unspecified: Secondary | ICD-10-CM | POA: Diagnosis not present

## 2022-02-23 DIAGNOSIS — E119 Type 2 diabetes mellitus without complications: Secondary | ICD-10-CM

## 2022-02-23 DIAGNOSIS — F32A Depression, unspecified: Secondary | ICD-10-CM | POA: Diagnosis not present

## 2022-02-23 DIAGNOSIS — Z6834 Body mass index (BMI) 34.0-34.9, adult: Secondary | ICD-10-CM | POA: Diagnosis not present

## 2022-02-23 DIAGNOSIS — Z794 Long term (current) use of insulin: Secondary | ICD-10-CM | POA: Diagnosis not present

## 2022-02-23 DIAGNOSIS — E669 Obesity, unspecified: Secondary | ICD-10-CM | POA: Diagnosis not present

## 2022-02-23 LAB — POCT GLYCOSYLATED HEMOGLOBIN (HGB A1C): Hemoglobin A1C: 6.4 % — AB (ref 4.0–5.6)

## 2022-02-23 LAB — GLUCOSE, CAPILLARY: Glucose-Capillary: 104 mg/dL — ABNORMAL HIGH (ref 70–99)

## 2022-02-23 MED ORDER — SAXENDA 18 MG/3ML ~~LOC~~ SOPN
1.2000 mg | PEN_INJECTOR | Freq: Every day | SUBCUTANEOUS | 3 refills | Status: DC
  Filled 2022-02-23: qty 18, 90d supply, fill #0

## 2022-02-23 MED ORDER — LIRAGLUTIDE 18 MG/3ML ~~LOC~~ SOPN
1.8000 mg | PEN_INJECTOR | Freq: Every day | SUBCUTANEOUS | 3 refills | Status: DC
  Filled 2022-02-23 – 2022-03-04 (×3): qty 9, 30d supply, fill #0
  Filled 2022-04-05: qty 9, 30d supply, fill #1

## 2022-02-23 NOTE — Patient Instructions (Addendum)
Thank you, Ms.Karilyn Cota for allowing [place] to provide your care today.   Depression: [facility] has a program called [facility], commissioning which is likely the fastest way to get help.   EACP Request a Consultation Call [phone] to meet with a [facility] representative and develop an EACP plan tailored to your organization.  [facility] Call [phone] for virtual appointments and crisis support.  I made a referral to psychiatry as well but it may take some time to get in. Other resources are with [facility]. [facility] Address: [address], [place], [place] [address] Hours:  Open ? Closes 5:30?PM Phone: [phone]  I have ordered the following labs for you:   Lab Orders         POC Hbg A1C       Follow up:  1 month     We look forward to seeing you next time. Please call our clinic at [phone] if you have any questions or concerns. The best time to call is Monday-Friday from 9am-4pm, but there is someone available 24/7. If after hours or the weekend, call the main hospital number and ask for the Internal Medicine Resident On-Call. If you need medication refills, please notify your pharmacy one week in advance and they will send [place] a request.   Thank you for trusting me with your care. Wishing you the best!   Christiana Fuchs, [place]
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
## 2022-02-23 NOTE — Assessment & Plan Note (Signed)
HgbA1c remains at 6.4. She recently started taking victoza to help with weight loss and diabetes management. Current dose at 1.8 mg daily. Kirke Shaggy was more expensive and she would prefer victoza. P: Refill victoza 1.8 mg daily Recheck A1c in 3 months

## 2022-02-24 NOTE — Addendum Note (Signed)
Addended by: Charise Killian on: 02/24/2022 02:24 PM   Modules accepted: Level of Service

## 2022-02-24 NOTE — Progress Notes (Signed)
Internal Medicine Clinic Attending  Case discussed with Dr. Masters  At the time of the visit.  We reviewed the resident's history and exam and pertinent patient test results.  I agree with the assessment, diagnosis, and plan of care documented in the resident's note.  

## 2022-03-03 ENCOUNTER — Other Ambulatory Visit: Payer: Self-pay

## 2022-03-03 ENCOUNTER — Other Ambulatory Visit (HOSPITAL_COMMUNITY): Payer: Self-pay

## 2022-03-04 ENCOUNTER — Other Ambulatory Visit (HOSPITAL_COMMUNITY): Payer: Self-pay

## 2022-03-07 ENCOUNTER — Other Ambulatory Visit (HOSPITAL_COMMUNITY): Payer: Self-pay

## 2022-03-11 ENCOUNTER — Other Ambulatory Visit: Payer: Self-pay

## 2022-03-11 ENCOUNTER — Encounter: Payer: Self-pay | Admitting: Orthopedic Surgery

## 2022-03-11 DIAGNOSIS — M5416 Radiculopathy, lumbar region: Secondary | ICD-10-CM

## 2022-03-17 ENCOUNTER — Telehealth: Payer: Self-pay | Admitting: Physical Medicine and Rehabilitation

## 2022-03-17 NOTE — Telephone Encounter (Signed)
Patient called needing to schedule an appointment with Dr Ernestina Patches    [phone]

## 2022-03-18 ENCOUNTER — Telehealth: Payer: Self-pay | Admitting: Physical Medicine and Rehabilitation

## 2022-03-18 NOTE — Telephone Encounter (Signed)
Patient returned call asked for a call back as soon as possible. The number to contact patient is [phone]

## 2022-03-21 NOTE — Telephone Encounter (Signed)
Spoke with patient and scheduled injection for 03/22/22 

## 2022-03-22 ENCOUNTER — Ambulatory Visit: Payer: Self-pay

## 2022-03-22 ENCOUNTER — Ambulatory Visit (INDEPENDENT_AMBULATORY_CARE_PROVIDER_SITE_OTHER): Payer: 59 | Admitting: Physical Medicine and Rehabilitation

## 2022-03-22 VITALS — BP 132/86 | HR 87

## 2022-03-22 DIAGNOSIS — M5416 Radiculopathy, lumbar region: Secondary | ICD-10-CM | POA: Diagnosis not present

## 2022-03-22 MED ORDER — METHYLPREDNISOLONE ACETATE 80 MG/ML IJ SUSP
80.0000 mg | Freq: Once | INTRAMUSCULAR | Status: AC
  Administered 2022-03-22: 80 mg

## 2022-03-22 NOTE — Patient Instructions (Signed)

## 2022-03-22 NOTE — Progress Notes (Signed)
Functional Pain Scale - descriptive words and definitions  Distracting (5)    Aware of pain/able to complete some ADL's but limited by pain/sleep is affected and active distractions are only slightly useful. Moderate range order  Average Pain 7   +Driver, -BT, -Dye Allergies.  Lower back pain on left side that radiates in the left leg. Cramp in 2 places, behind the knee and the thigh of the left leg

## 2022-03-30 NOTE — Procedures (Signed)
S1 Lumbosacral Transforaminal Epidural Steroid Injection - Sub-Pedicular Approach with Fluoroscopic Guidance   Patient: Alicia Obrien      Date of Birth: 1975/04/08 MRN: [id number] PCP: Gaylan Gerold, DO      Visit Date: 03/22/2022   Universal Protocol:    Date/Time: 03/20/249:23 AM  Consent Given By: the patient  Position:  PRONE  Additional Comments: Vital signs were monitored before and after the procedure. Patient was prepped and draped in the usual sterile fashion. The correct patient, procedure, and site was verified.   Injection Procedure Details:  Procedure Site One Meds Administered:  Meds ordered this encounter  Medications   methylPREDNISolone acetate (DEPO-MEDROL) injection 80 mg    Laterality: Left  Location/Site:  S1 Foramen   Needle size: 22 ga.  Needle type: Spinal  Needle Placement: Transforaminal  Findings:   -Comments: Excellent flow of contrast along the nerve, nerve root and into the epidural space.  Epidurogram: Contrast epidurogram showed no nerve root cut off or restricted flow pattern.  Procedure Details: After squaring off the sacral end-plate to get a true AP view, the C-arm was positioned so that the best possible view of the S1 foramen was visualized. The soft tissues overlying this structure were infiltrated with 2-3 ml. of 1% Lidocaine without Epinephrine.    The spinal needle was inserted toward the target using a "trajectory" view along the fluoroscope beam.  Under AP and lateral visualization, the needle was advanced so it did not puncture dura. Biplanar projections were used to confirm position. Aspiration was confirmed to be negative for CSF and/or blood. A 1-2 ml. volume of Isovue-250 was injected and flow of contrast was noted at each level. Radiographs were obtained for documentation purposes.   After attaining the desired flow of contrast documented above, a 0.5 to 1.0 ml test dose of 0.25% Marcaine was injected into  each respective transforaminal space.  The patient was observed for 90 seconds post injection.  After no sensory deficits were reported, and normal lower extremity motor function was noted,   the above injectate was administered so that equal amounts of the injectate were placed at each foramen (level) into the transforaminal epidural space.   Additional Comments:  No complications occurred Dressing: Band-Aid with 2 x 2 sterile gauze    Post-procedure details: Patient was observed during the procedure. Post-procedure instructions were reviewed.  Patient left the clinic in stable condition.

## 2022-03-30 NOTE — Progress Notes (Signed)
Alicia Obrien - 47 y.o. female MRN [id number]  Date of birth: 1975/08/19  Office Visit Note: Visit Date: 03/22/2022 PCP: Gaylan Gerold, DO Referred by: Callie Fielding, MD  Subjective: Chief Complaint  Patient presents with   Lower Back - Pain   HPI:  Alicia Obrien is a 47 y.o. female who comes in today at the request of Dr. Ileene Rubens for planned Left S1-2 Lumbar Transforaminal epidural steroid injection with fluoroscopic guidance.  The patient has failed conservative care including home exercise, medications, time and activity modification.  This injection will be diagnostic and hopefully therapeutic.  Please see requesting physician notes for further details and justification.   ROS Otherwise per HPI.  Assessment & Plan: Visit Diagnoses:    ICD-10-CM   1. Lumbar radiculopathy  M54.16 XR C-ARM NO REPORT    Epidural Steroid injection    methylPREDNISolone acetate (DEPO-MEDROL) injection 80 mg      Plan: No additional findings.   Meds & Orders:  Meds ordered this encounter  Medications   methylPREDNISolone acetate (DEPO-MEDROL) injection 80 mg    Orders Placed This Encounter  Procedures   XR C-ARM NO REPORT   Epidural Steroid injection    Follow-up: Return for visit to requesting provider as needed.   Procedures: No procedures performed  S1 Lumbosacral Transforaminal Epidural Steroid Injection - Sub-Pedicular Approach with Fluoroscopic Guidance   Patient: Alicia Obrien      Date of Birth: 1975/06/25 MRN: [id number] PCP: Gaylan Gerold, DO      Visit Date: 03/22/2022   Universal Protocol:    Date/Time: 03/20/249:23 AM  Consent Given By: the patient  Position:  PRONE  Additional Comments: Vital signs were monitored before and after the procedure. Patient was prepped and draped in the usual sterile fashion. The correct patient, procedure, and site was verified.   Injection Procedure Details:  Procedure Site One Meds Administered:   Meds ordered this encounter  Medications   methylPREDNISolone acetate (DEPO-MEDROL) injection 80 mg    Laterality: Left  Location/Site:  S1 Foramen   Needle size: 22 ga.  Needle type: Spinal  Needle Placement: Transforaminal  Findings:   -Comments: Excellent flow of contrast along the nerve, nerve root and into the epidural space.  Epidurogram: Contrast epidurogram showed no nerve root cut off or restricted flow pattern.  Procedure Details: After squaring off the sacral end-plate to get a true AP view, the C-arm was positioned so that the best possible view of the S1 foramen was visualized. The soft tissues overlying this structure were infiltrated with 2-3 ml. of 1% Lidocaine without Epinephrine.    The spinal needle was inserted toward the target using a "trajectory" view along the fluoroscope beam.  Under AP and lateral visualization, the needle was advanced so it did not puncture dura. Biplanar projections were used to confirm position. Aspiration was confirmed to be negative for CSF and/or blood. A 1-2 ml. volume of Isovue-250 was injected and flow of contrast was noted at each level. Radiographs were obtained for documentation purposes.   After attaining the desired flow of contrast documented above, a 0.5 to 1.0 ml test dose of 0.25% Marcaine was injected into each respective transforaminal space.  The patient was observed for 90 seconds post injection.  After no sensory deficits were reported, and normal lower extremity motor function was noted,   the above injectate was administered so that equal amounts of the injectate were placed at each foramen (level) into the transforaminal epidural  space.   Additional Comments:  No complications occurred Dressing: Band-Aid with 2 x 2 sterile gauze    Post-procedure details: Patient was observed during the procedure. Post-procedure instructions were reviewed.  Patient left the clinic in stable condition.   Clinical  History: MRI LUMBAR SPINE WITHOUT CONTRAST   TECHNIQUE: Multiplanar, multisequence MR imaging of the lumbar spine was performed. No intravenous contrast was administered.   COMPARISON:  12/14/2019   FINDINGS: Segmentation:  5 lumbar type vertebrae   Alignment:  Physiologic.   Vertebrae:  No fracture, evidence of discitis, or bone lesion.   Conus medullaris and cauda equina: Conus extends to the L2 level. Conus and cauda equina appear normal.   Paraspinal and other soft tissues: Negative for perispinal mass or inflammation   Disc levels:   L5-S1 disc desiccation and narrowing with central protrusion that contacts but does not compress the descending S1 nerve roots. A discrete herniation that was left paracentral on prior has regressed. Facet spurring on both sides at this level with mild bilateral foraminal narrowing based on axial images. Borderline facet spurring at L4-5.   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 No progression since 2021, rather left subarticular recess patency is improved after regression of previously seen herniation.     Electronically Signed   By: Jorje Guild M.D.   On: 11/16/2021 19:11     Objective:  VS:  HT:    WT:   BMI:     BP:132/86  HR:87bpm  TEMP: ( )  RESP:  Physical Exam Vitals and nursing note reviewed.  Constitutional:      General: She is not in acute distress.    Appearance: Normal appearance. She is not ill-appearing.  HENT:     Head: Normocephalic and atraumatic.     Right Ear: External ear normal.     Left Ear: External ear normal.  Eyes:     Extraocular Movements: Extraocular movements intact.  Cardiovascular:     Rate and Rhythm: Normal rate.     Pulses: Normal pulses.  Pulmonary:     Effort: Pulmonary effort is normal. No respiratory distress.  Abdominal:     General: There is no distension.     Palpations: Abdomen is soft.  Musculoskeletal:        General: Tenderness present.     Cervical  back: Neck supple.     Right lower leg: No edema.     Left lower leg: No edema.     Comments: Patient has good distal strength with no pain over the greater trochanters.  No clonus or focal weakness.  Skin:    Findings: No erythema, lesion or rash.  Neurological:     General: No focal deficit present.     Mental Status: She is alert and oriented to person, place, and time.     Sensory: No sensory deficit.     Motor: No weakness or abnormal muscle tone.     Coordination: Coordination normal.  Psychiatric:        Mood and Affect: Mood normal.        Behavior: Behavior normal.      Imaging: No results found.

## 2022-04-05 ENCOUNTER — Other Ambulatory Visit (HOSPITAL_COMMUNITY): Payer: Self-pay

## 2022-04-22 ENCOUNTER — Other Ambulatory Visit (HOSPITAL_COMMUNITY): Payer: Self-pay

## 2022-04-28 ENCOUNTER — Ambulatory Visit (INDEPENDENT_AMBULATORY_CARE_PROVIDER_SITE_OTHER): Payer: 59 | Admitting: Orthopedic Surgery

## 2022-04-28 DIAGNOSIS — Z9889 Other specified postprocedural states: Secondary | ICD-10-CM | POA: Diagnosis not present

## 2022-04-28 NOTE — Progress Notes (Signed)
Orthopedic Surgery Office Progress Note   Assessment: Patient is a 47 y.o. female who is underwent left sided hemilaminotomy and microdiscectomy at L5/S1. Doing well Date of surgery: 12/10/2021 (~4.5 months from surgery)   Plan: -Operative plans complete -Okay to submerge wound -Okay to get periodic injections as needed -Out of bed as tolerated, no brace -No spine specific restrictions -Work note provided with no restrictions -Follow up on an as needed basis   ___________________________________________________________________________     Subjective: Patient had recurrent symptoms of pain rating down the left leg at our last visit.  Since then, she got a transforaminal injection on the left side at L5/S1 and notes 100% relief.  She is having no leg or back pain at this time.  She thinks that her symptoms recurred because she was forced to sit for prolonged periods of time at her job.  She states she does better if she is standing.  Denies paresthesia numbness.  No bowel or bladder incontinence.  No saddle anesthesia.   Objective:   General: no acute distress, appropriate affect Neurologic: alert, answering questions appropriately, following commands Respiratory: unlabored breathing on room air Skin: incision well healed with no redness, drainage, or induration   MSK (spine):   -Strength exam                                                   Right                Left   EHL                              5/5                  5/5 TA                                 5/5                  5/5 GSC                             5/5                  5/5 Knee extension            5/5                  5/5 Hip flexion                    5/5                  5/5   -Sensory exam                           Sensation intact to light touch in L3-S1 nerve distributions of bilateral lower extremities     Patient name: Alicia Obrien Patient MRN: [id number] Date: 04/28/22

## 2022-05-10 ENCOUNTER — Encounter: Payer: Self-pay | Admitting: Obstetrics and Gynecology

## 2022-05-10 NOTE — Progress Notes (Unsigned)
[facility]: Clinic Note  Subjective:  History of Present Illness: Alicia Obrien is a 47 y.o. year old female who presents for an acute care visit with concerns of BRBPR.  In April 2023, she was on a long car ride, felt like she needed to defecate, but when she went to the bathroom, passed clots of blood with no stool. This continued every 30 minutes on the drive home from [place]. She continued to pass clots for the next 1.5 weeks. This resolved on its own.  Colonoscopy was normal in 04/2021, unclear if this was before or after symptoms first started.  In December 2023, the symptoms returned. Every day, Alicia Obrien will have clots of blood in her stool. She is having regular bowel movements, and her bowel movements are not mixed with blood. She has blood when she wipes almost always. No associated fevers, chills, weight loss, or abdominal pain. This is now affecting her quality of life. She is constantly leaking blood, needing to wear a panty liner and carrying wipes in her purse. She feels fatigued, but no dizziness or lightheadedness.  She is s/p hysterectomy, and is confident that this is not vaginal bleeding. No blood in her urine. No other bleeding, including no hematemesis.   Please refer to Assessment and Plan below for full details in Problem-Based Charting.   Past Medical History:  Patient Active Problem List   Diagnosis Date Noted   BRBPR (bright red blood per rectum) 05/11/2022   Depression 02/23/2022   PTSD (post-traumatic stress disorder) 02/23/2022   Sinus pressure 05/18/2021   Vitamin D deficiency 03/03/2021   Type 2 diabetes mellitus (HCC) 12/10/2020   Healthcare maintenance 12/10/2020   Left elbow pain 06/26/2020   Swelling of lower extremity 05/06/2020   Lumbar radiculopathy 11/27/2019   Furuncle of nose 11/19/2019   Status post laparoscopic hysterectomy 09/17/2019   Genetic testing 08/12/2019   Family history of breast cancer    Obesity  03/27/2015   Essential hypertension 01/28/2015   History of ovarian cyst 01/28/2015   Hyperlipidemia 01/28/2015     Medications:  Current Outpatient Medications:    cetirizine (ZYRTEC) 10 MG tablet, Take 20 mg by mouth daily., Disp: , Rfl:    hydrochlorothiazide (HYDRODIURIL) 12.5 MG tablet, Take 1 tablet (12.5 mg total) by mouth daily., Disp: 90 tablet, Rfl: 2   Insulin Pen Needle (UNIFINE PENTIPS) 32G X 4 MM MISC, Use daily as directed, Disp: 100 each, Rfl: 3   liraglutide (VICTOZA) 18 MG/3ML SOPN, Inject 1.8 mg into the skin daily., Disp: 9 mL, Rfl: 3   losartan (COZAAR) 50 MG tablet, Take 1 tablet by mouth daily, Disp: 90 tablet, Rfl: 2   Oxymetazoline HCl (VICKS SINEX 12 HOUR NA), Place 1 spray into the nose daily as needed (congestion)., Disp: , Rfl:    rosuvastatin (CRESTOR) 20 MG tablet, Take 1 tablet (20 mg total) by mouth daily., Disp: 90 tablet, Rfl: 3   Allergies: Allergies  Allergen Reactions   Latex Anaphylaxis and Hives   Chlorhexidine Gluconate Itching   Amlodipine Swelling    Objective:   Vitals: Vitals:   05/11/22 0901 05/11/22 0917  BP: (!) 140/78 132/85  Pulse: 84 71  Temp: 98 F (36.7 C)   SpO2: 100%      Physical Exam: Physical Exam Constitutional:      Appearance: Normal appearance.  Genitourinary:    Comments: +small external hemorrhoid at 5:00 location; +bright red blood around anus. Normal anal tone. No  fissures. Neurological:     Mental Status: She is alert.  Psychiatric:        Mood and Affect: Mood normal.        Behavior: Behavior normal.      Data: Labs, imaging, and micro were reviewed in Epic. Refer to Assessment and Plan below for full details in Problem-Based Charting.  Assessment & Plan:  BRBPR (bright red blood per rectum) - Etiology is not 100% clear, but I think most likely ddx would include hemorrhoids, a rectal ulcer, or possibly angiodysplasia, though she would be younger than typical for this. I don't think malignancy is  likely given her normal colonoscopy 1 year ago.  - GI referral for consideration of flex sig - CBC, PT/PTT, and iron studies today    We did not address her chronic stable medical conditions at this visit. She should follow up to discuss her T2DM (A1C, eye exam, urine MACR due) and mental health at next visit.    Patient will follow up in 1-3 months for follow up of her chronic medical conditions.   Mercie Eon, MD

## 2022-05-11 ENCOUNTER — Ambulatory Visit (INDEPENDENT_AMBULATORY_CARE_PROVIDER_SITE_OTHER): Payer: 59 | Admitting: Internal Medicine

## 2022-05-11 ENCOUNTER — Encounter: Payer: Self-pay | Admitting: Dietician

## 2022-05-11 VITALS — BP 132/85 | HR 71 | Temp 98.0°F | Ht 68.0 in | Wt 234.3 lb

## 2022-05-11 DIAGNOSIS — K625 Hemorrhage of anus and rectum: Secondary | ICD-10-CM | POA: Insufficient documentation

## 2022-05-11 LAB — CBC
HCT: 36.9 % (ref 36.0–46.0)
Hemoglobin: 12.2 g/dL (ref 12.0–15.0)
MCH: 27.9 pg (ref 26.0–34.0)
MCHC: 33.1 g/dL (ref 30.0–36.0)
MCV: 84.2 fL (ref 80.0–100.0)
Platelets: 179 10*3/uL (ref 150–400)
RBC: 4.38 MIL/uL (ref 3.87–5.11)
RDW: 13.6 % (ref 11.5–15.5)
WBC: 7 10*3/uL (ref 4.0–10.5)
nRBC: 0 % (ref 0.0–0.2)

## 2022-05-11 LAB — PROTIME-INR
INR: 1 (ref 0.8–1.2)
Prothrombin Time: 12.9 seconds (ref 11.4–15.2)

## 2022-05-11 LAB — APTT: aPTT: 23 seconds — ABNORMAL LOW (ref 24–36)

## 2022-05-11 NOTE — Patient Instructions (Signed)
Dear Alicia Obrien,  It was a pleasure seeing you in clinic today.  I will call you after work with your lab results before the weekend.   I have placed a referral to GI - they should call you to schedule.  You're due to follow up for your chronic conditions too - I recommend that you schedule in the next 3 months. I'm happy to see you again or you can see the residents.  Thanks for coming in, [name]

## 2022-05-11 NOTE — Assessment & Plan Note (Addendum)
-   Etiology is not 100% clear, but I think most likely ddx would include hemorrhoids, a rectal ulcer, or possibly angiodysplasia, though she would be younger than typical for this. I don't think malignancy is likely given her normal colonoscopy 1 year ago.  - GI referral for consideration of flex sig - CBC, PT/PTT, and iron studies today

## 2022-05-13 LAB — IRON,TIBC AND FERRITIN PANEL
Ferritin: 198 ng/mL — ABNORMAL HIGH (ref 15–150)
Iron Saturation: 21 % (ref 15–55)
Iron: 78 ug/dL (ref 27–159)
Total Iron Binding Capacity: 365 ug/dL (ref 250–450)
UIBC: 287 ug/dL (ref 131–425)

## 2022-05-13 NOTE — Progress Notes (Signed)
I talked with Alicia Obrien to review her labs. CBC, iron studies, and coags look stable. She has an appointment with GI on 5/15, and will follow up with her PCP Dr Cyndie Chime next week for her chronic stable medical conditions.

## 2022-05-17 ENCOUNTER — Other Ambulatory Visit (HOSPITAL_COMMUNITY): Payer: Self-pay

## 2022-05-17 ENCOUNTER — Other Ambulatory Visit: Payer: Self-pay

## 2022-05-17 ENCOUNTER — Encounter: Payer: Self-pay | Admitting: Student

## 2022-05-17 ENCOUNTER — Ambulatory Visit (INDEPENDENT_AMBULATORY_CARE_PROVIDER_SITE_OTHER): Payer: 59 | Admitting: Student

## 2022-05-17 VITALS — BP 122/77 | HR 74 | Temp 98.2°F | Ht 68.0 in | Wt 235.1 lb

## 2022-05-17 DIAGNOSIS — E119 Type 2 diabetes mellitus without complications: Secondary | ICD-10-CM

## 2022-05-17 DIAGNOSIS — F32A Depression, unspecified: Secondary | ICD-10-CM | POA: Diagnosis not present

## 2022-05-17 DIAGNOSIS — K625 Hemorrhage of anus and rectum: Secondary | ICD-10-CM

## 2022-05-17 DIAGNOSIS — I10 Essential (primary) hypertension: Secondary | ICD-10-CM

## 2022-05-17 LAB — GLUCOSE, CAPILLARY: Glucose-Capillary: 130 mg/dL — ABNORMAL HIGH (ref 70–99)

## 2022-05-17 LAB — POCT GLYCOSYLATED HEMOGLOBIN (HGB A1C): Hemoglobin A1C: 6.4 % — AB (ref 4.0–5.6)

## 2022-05-17 MED ORDER — SEMAGLUTIDE(0.25 OR 0.5MG/DOS) 2 MG/3ML ~~LOC~~ SOPN
0.5000 mg | PEN_INJECTOR | SUBCUTANEOUS | 0 refills | Status: DC
Start: 2022-05-17 — End: 2022-06-07
  Filled 2022-05-17: qty 3, 28d supply, fill #0

## 2022-05-17 NOTE — Assessment & Plan Note (Signed)
Patient was seen last week for hematochezia.  This started after her back surgery in December.  She reports constipation after the surgery due to pain medication.  This could be hemorrhoids versus rectal wall tear/anal fissure from constipation.  Fortunately her hematochezia has slowed down and her blood counts was unremarkable.  She denies any constipation at this time.  Patient will follow-up with GI on 5/15 for further workup.

## 2022-05-17 NOTE — Progress Notes (Signed)
Internal Medicine Clinic Attending  Case discussed with Dr. Nguyen  At the time of the visit.  We reviewed the resident's history and exam and pertinent patient test results.  I agree with the assessment, diagnosis, and plan of care documented in the resident's note. 

## 2022-05-17 NOTE — Patient Instructions (Signed)
It was nice seeing you in the clinic today.  Here is a summary what we talked about:  1.  Your blood pressure is well-controlled.  Please continue losartan/HCTZ.  We will check your kidney function at next visit.  2.  Your A1c is 6.4, which is great.  Lets try Ozempic if it is covered.  Please start with 0.5 mg once a week for 4 weeks and titrate up to 1 mg weekly.  3.  Please follow-up with your gastroenterologist on 5/15   4.  Please follow-up with [facility] when you are able.  Let us know if you need any additional help.  Please follow-up in 6 months  Dr. Cyndie Chime

## 2022-05-17 NOTE — Assessment & Plan Note (Signed)
Blood pressure is well-controlled 122/77.  Patient reports adherence to medications.  -Continue losartan 25 mg and HCTZ 12.5 mg -Last BMP in December was unremarkable.  Will recheck BMP at next visit.

## 2022-05-17 NOTE — Assessment & Plan Note (Addendum)
Diabetes is well-controlled with A1c of 6.4.  Patient was on Victoza 1.8 mg daily but has stopped taking since her hematochezia.  Patient states that she has not noticed any weight loss.  She was started on Trulicity but thinks that it was not working.  Saxenda worked for her but the co-pay was unaffordable.  She is willing to try Ozempic.  -Start Ozempic 0.5 mg weekly x 4 weeks then titrate up to 1 mg weekly.  If the lower dose of Ozempic is not covered, I think she can tolerate the higher dose of Ozempic since she is doing well on Victoza. -She is up-to-date with her ophthalmology and foot exam -Obtain microalbuminuria today

## 2022-05-17 NOTE — Assessment & Plan Note (Signed)
Patient reports improvements of her mood when she went back to work.  PHQ-9 today was 0.  She states that the back surgery and being homebound really worsened her depression.  She currently denies any suicidal ideation.  She is in the process of getting to see a psychiatrist with [facility].  She is hesitant to starting medication due to strong family history of bipolar disorder.  She did endorse 1 episode of possible mania when she did not sleep for days and still felt energetic and tried to accomplish many tasks at once.  I think it is is reasonable not to start an SSRI or SNRI at this time.  She may need a mood stabilizer if she truly has bipolar disorder.  Patient will reach out to [place] for any urgent needs.

## 2022-05-17 NOTE — Progress Notes (Signed)
CC: F/u on HTN and A1C  HPI:  Alicia Obrien is a 47 y.o. living with hypertension, type 2 diabetes, depression, who presents to the clinic for blood pressure and A1c follow-up.  Please see problem based charting for detail  Past Medical History:  Diagnosis Date   Allergy    Anemia    younger   Constipation    Depression    Diabetes mellitus without complication, without long-term current use of insulin (HCC)    Elevated cholesterol    Family history of breast cancer    Family history of prostate cancer    GERD (gastroesophageal reflux disease)    Heart murmur    "begnin"   Hypertension    PTSD (post-traumatic stress disorder)    Vertigo    Review of Systems:  per HPI  Physical Exam:  Vitals:   05/17/22 0848  BP: 122/77  Pulse: 74  Temp: 98.2 F (36.8 C)  TempSrc: Oral  SpO2: 100%  Weight: 235 lb 1.6 oz (106.6 kg)  Height: 5\' 8"  (1.727 m)   Physical Exam Constitutional:      General: She is not in acute distress.    Appearance: She is not ill-appearing.  HENT:     Head: Normocephalic.  Eyes:     General:        Right eye: No discharge.        Left eye: No discharge.     Conjunctiva/sclera: Conjunctivae normal.  Cardiovascular:     Rate and Rhythm: Normal rate and regular rhythm.  Pulmonary:     Effort: Pulmonary effort is normal. No respiratory distress.     Breath sounds: Normal breath sounds. No wheezing.  Abdominal:     General: Bowel sounds are normal. There is no distension.     Palpations: Abdomen is soft.     Tenderness: There is no abdominal tenderness.  Musculoskeletal:        General: Normal range of motion.     Cervical back: Normal range of motion.  Skin:    General: Skin is warm.  Neurological:     Mental Status: She is alert. Mental status is at baseline.  Psychiatric:        Mood and Affect: Mood normal.      Assessment & Plan:   See Encounters Tab for problem based charting.  Essential hypertension Blood  pressure is well-controlled 122/77.  Patient reports adherence to medications.  -Continue losartan 25 mg and HCTZ 12.5 mg -Last BMP in December was unremarkable.  Will recheck BMP at next visit.  Type 2 diabetes mellitus (HCC) Diabetes is well-controlled with A1c of 6.4.  Patient was on Victoza 1.8 mg daily but has stopped taking since her hematochezia.  Patient states that she has not noticed any weight loss.  She was started on Trulicity but thinks that it was not working.  Saxenda worked for her but the co-pay was unaffordable.  She is willing to try Ozempic.  -Start Ozempic 0.5 mg weekly x 4 weeks then titrate up to 1 mg weekly.  If the lower dose of Ozempic is not covered, I think she can tolerate the higher dose of Ozempic since she is doing well on Victoza. -She is up-to-date with her ophthalmology and foot exam -Obtain microalbuminuria today  BRBPR (bright red blood per rectum) Patient was seen last week for hematochezia.  This started after her back surgery in December.  She reports constipation after the surgery due to pain medication.  This could be hemorrhoids versus rectal wall tear/anal fissure from constipation.  Fortunately her hematochezia has slowed down and her blood counts was unremarkable.  She denies any constipation at this time.  Patient will follow-up with GI on 5/15 for further workup.  Depression Patient reports improvements of her mood when she went back to work.  PHQ-9 today was 0.  She states that the back surgery and being homebound really worsened her depression.  She currently denies any suicidal ideation.  She is in the process of getting to see a psychiatrist with [facility].  She is hesitant to starting medication due to strong family history of bipolar disorder.  She did endorse 1 episode of possible mania when she did not sleep for days and still felt energetic and tried to accomplish many tasks at once.  I think it is is reasonable not to start an SSRI or SNRI at  this time.  She may need a mood stabilizer if she truly has bipolar disorder.  Patient will reach out to [place] for any urgent needs.   Patient discussed with Dr.  Lafonda Mosses

## 2022-05-19 LAB — MICROALBUMIN / CREATININE URINE RATIO
Creatinine, Urine: 63.1 mg/dL
Microalb/Creat Ratio: <5 mg/g creat (ref 0–29)
Microalbumin, Urine: <3 ug/mL

## 2022-05-25 ENCOUNTER — Ambulatory Visit (INDEPENDENT_AMBULATORY_CARE_PROVIDER_SITE_OTHER): Payer: 59 | Admitting: Physician Assistant

## 2022-05-25 ENCOUNTER — Other Ambulatory Visit (HOSPITAL_COMMUNITY): Payer: Self-pay

## 2022-05-25 ENCOUNTER — Encounter: Payer: Self-pay | Admitting: Physician Assistant

## 2022-05-25 VITALS — BP 120/70 | HR 70 | Ht 68.0 in | Wt 233.0 lb

## 2022-05-25 DIAGNOSIS — K625 Hemorrhage of anus and rectum: Secondary | ICD-10-CM

## 2022-05-25 DIAGNOSIS — K602 Anal fissure, unspecified: Secondary | ICD-10-CM | POA: Diagnosis not present

## 2022-05-25 MED ORDER — DILTIAZEM GEL 2 %: CUTANEOUS | 1 refills | Status: DC

## 2022-05-25 MED ORDER — DILTIAZEM GEL 2 %
CUTANEOUS | 1 refills | Status: DC
  Filled 2022-05-25: qty 45, fill #0

## 2022-05-25 NOTE — Progress Notes (Signed)
Chief Complaint: Rectal Bleeding  HPI:    Alicia Obrien is a 47 year old African-American female with a past medical history as listed below including constipation, depression, diabetes, GERD and multiple others, known to Dr. Christella Hartigan previously, who was referred to me by Doran Stabler, DO for a complaint of rectal bleeding.      04/30/2021 colonoscopy with Dr. Christella Hartigan for screening with entire colon normal, repeat recommended in 10 years.    05/11/2022 patient seen by PCP for bright red blood per rectum.  At that time described in April 2023 she had been on a long car ride and felt like she needed to defecate but when she went to the bathroom she passed clots of blood with no stool.,  Apparently passed clots for the next week and a half and then it resolved on its own.  Apparently in December symptoms returned every day clots in her stool regular bowel movements.  Apparently constantly leaking blood and wearing a panty liner.  Physical exam at that time with a small external hemorrhoid, bright red blood around the anus.  At that time her PCP recommended a possible flex sig.  She had CBC and iron studies that day.    05/11/2022 CBC normal.  PT/INR normal.  Iron studies normal.    Today, patient presents to clinic and tells me that as she told her PCP above she had an episode of passing clots back in April 2023 but then had a colonoscopy after that.  She was doing well since then until December when she started passing bright red blood again.  Tells me it is pretty much every time she has a soft solid bowel movement which is once every 1 to 2 days that she will see drips of bright red blood in the toilet and occasional bright red blood on the toilet paper, she has started wearing pads just in case and occasionally will see a little bit of blood on this.  Denies any rectal pain or harder/larger bowel movements than normal.    Works as a Clinical biochemist for [employer].    Denies fever, chills, weight loss, abdominal pain or symptoms  that awaken her from sleep.  Past Medical History:  Diagnosis Date   Allergy    Anemia    younger   Constipation    Depression    Diabetes mellitus without complication, without long-term current use of insulin (HCC)    Elevated cholesterol    Family history of breast cancer    Family history of prostate cancer    GERD (gastroesophageal reflux disease)    Heart murmur    "begnin"   Hypertension    PTSD (post-traumatic stress disorder)    Vertigo     Past Surgical History:  Procedure Laterality Date   CYSTOSCOPY N/A 09/17/2019   Procedure: CYSTOSCOPY;  Surgeon: Romualdo Bolk, MD;  [facility];  Service: Gynecology;  Laterality: N/A;   HEMI-MICRODISCECTOMY LUMBAR LAMINECTOMY LEVEL 1 Left 12/10/2021   Procedure: L5-S1 LEFT SIDED HEMILAMINECTOMY AND DISCECTOMY;  Surgeon: London Sheer, MD;  Location: [facility] OR;  Service: Orthopedics;  Laterality: Left;   TOTAL LAPAROSCOPIC HYSTERECTOMY WITH SALPINGECTOMY N/A 09/17/2019   Procedure: TOTAL LAPAROSCOPIC HYSTERECTOMY, BILATERAL SALPINGECTOMY;  Surgeon: Romualdo Bolk, MD;  Location: [facility];  Service: Gynecology;  Laterality: N/A;    Current Outpatient Medications  Medication Sig Dispense Refill   cetirizine (ZYRTEC) 10 MG tablet Take 20 mg by mouth daily.     hydrochlorothiazide (  HYDRODIURIL) 12.5 MG tablet Take 1 tablet (12.5 mg total) by mouth daily. 90 tablet 2   Insulin Pen Needle (UNIFINE PENTIPS) 32G X 4 MM MISC Use daily as directed 100 each 3   losartan (COZAAR) 50 MG tablet Take 1 tablet by mouth daily 90 tablet 2   Oxymetazoline HCl (VICKS SINEX 12 HOUR NA) Place 1 spray into the nose daily as needed (congestion).     rosuvastatin (CRESTOR) 20 MG tablet Take 1 tablet (20 mg total) by mouth daily. 90 tablet 3   Semaglutide,0.25 or 0.5MG /DOS, 2 MG/3ML SOPN Inject 0.5 mg into the skin once a week for 28 days. 3 mL 0   No current facility-administered medications for this  visit.    Allergies as of 05/25/2022 - Review Complete 05/17/2022  Allergen Reaction Noted   Latex Anaphylaxis and Hives 04/26/2014   Chlorhexidine gluconate Itching 09/17/2019   Amlodipine Swelling 05/06/2020    Family History  Problem Relation Age of Onset   Diabetes Mother    Heart attack Father    Cancer Maternal Uncle        bone cancer    Heart disease Maternal Uncle    Prostate cancer Maternal Uncle 91   Breast cancer Paternal Aunt        dx early 24s   Other Paternal Aunt        has breast lumps, watching her closely   Prostate cancer Paternal Uncle 59       dx 2nd time in his 19s   Dementia Maternal Grandmother    Breast cancer Paternal Grandmother 50       d. in her 108s   Breast cancer Cousin 55       mother's maternal first cousin   Breast cancer Maternal Great-grandmother    Breast cancer Other        PGMs sister dx in her 37s   Colon polyps Neg Hx    Colon cancer Neg Hx     Social History   Socioeconomic History   Marital status: Single    Spouse name: Not on file   Number of children: 3   Years of education: 16   Highest education level: Not on file  Occupational History   Not on file  Tobacco Use   Smoking status: Never   Smokeless tobacco: Never  Vaping Use   Vaping Use: Never used  Substance and Sexual Activity   Alcohol use: No   Drug use: No   Sexual activity: Yes  Other Topics Concern   Not on file  Social History Narrative   Not on file   Social Determinants of Health   Financial Resource Strain: Low Risk  (02/23/2022)   Overall Financial Resource Strain (CARDIA)    Difficulty of Paying Living Expenses: Not hard at all  Food Insecurity: No Food Insecurity (02/23/2022)   Hunger Vital Sign    Worried About Running Out of Food in the Last Year: Never true    Ran Out of Food in the Last Year: Never true  Transportation Needs: No Transportation Needs (02/23/2022)   PRAPARE - Administrator, Civil Service (Medical): No     Lack of Transportation (Non-Medical): No  Physical Activity: Sufficiently Active (02/23/2022)   Exercise Vital Sign    Days of Exercise per Week: 5 days    Minutes of Exercise per Session: 40 min  Stress: No Stress Concern Present (02/23/2022)   [employer] of Occupational Health - Occupational Stress  Questionnaire    Feeling of Stress : Not at all  Social Connections: Moderately Isolated (02/23/2022)   Social Connection and Isolation Panel [NHANES]    Frequency of Communication with Friends and Family: More than three times a week    Frequency of Social Gatherings with Friends and Family: More than three times a week    Attends Religious Services: 1 to 4 times per year    Active Member of [employer] or Organizations: No    Attends Banker Meetings: Never    Marital Status: Never married  Intimate Partner Violence: Not At Risk (02/23/2022)   Humiliation, Afraid, Rape, and Kick questionnaire    Fear of Current or Ex-Partner: No    Emotionally Abused: No    Physically Abused: No    Sexually Abused: No    Review of Systems:    Constitutional: No weight loss, fever or chills Cardiovascular: No chest pain Respiratory: No SOB  Gastrointestinal: See HPI and otherwise negative   Physical Exam:  Vital signs: BP 120/70   Pulse 70   Ht 5\' 8"  (1.727 m)   Wt 233 lb (105.7 kg)   LMP 09/09/2019   BMI 35.43 kg/m    Constitutional:   Pleasant overweight AA female appears to be in NAD, Well developed, Well nourished, alert and cooperative Respiratory: Respirations even and unlabored. Lungs clear to auscultation bilaterally.   No wheezes, crackles, or rhonchi.  Cardiovascular: Normal S1, S2. No MRG. Regular rate and rhythm. No peripheral edema, cyanosis or pallor.  Gastrointestinal:  Soft, nondistended, nontender. No rebound or guarding. Normal bowel sounds. No appreciable masses or hepatomegaly. Rectal:  External: small posterior fissure; Internal: no mass, tight sphincter tone;  Anoscopy: Grade I hemorrhoids and posterior fissure, ttp Psychiatric: Oriented to person, place and time. Demonstrates good judgement and reason without abnormal affect or behaviors.  RELEVANT LABS AND IMAGING: CBC    Component Value Date/Time   WBC 7.0 05/11/2022 1128   RBC 4.38 05/11/2022 1128   HGB 12.2 05/11/2022 1128   HGB 12.6 07/22/2019 0000   HCT 36.9 05/11/2022 1128   HCT 38.4 07/22/2019 0000   PLT 179 05/11/2022 1128   PLT 210 07/22/2019 0000   MCV 84.2 05/11/2022 1128   MCV 84 07/22/2019 0000   MCH 27.9 05/11/2022 1128   MCHC 33.1 05/11/2022 1128   RDW 13.6 05/11/2022 1128   RDW 13.1 07/22/2019 0000   LYMPHSABS 4.1 (H) 09/22/2019 0038   LYMPHSABS 4.1 (H) 07/22/2019 0000   MONOABS 0.7 09/22/2019 0038   EOSABS 0.4 09/22/2019 0038   EOSABS 0.3 07/22/2019 0000   BASOSABS 0.1 09/22/2019 0038   BASOSABS 0.0 07/22/2019 0000    CMP     Component Value Date/Time   NA 137 12/10/2021 1021   NA 138 12/10/2020 1010   K 3.7 12/10/2021 1021   CL 104 12/10/2021 1021   CO2 21 (L) 12/10/2021 1021   GLUCOSE 111 (H) 12/10/2021 1021   BUN 13 12/10/2021 1021   BUN 12 12/10/2020 1010   CREATININE 0.74 12/10/2021 1021   CREATININE 0.91 06/29/2015 0847   CALCIUM 9.3 12/10/2021 1021   PROT 7.1 09/13/2019 1339   ALBUMIN 3.9 09/13/2019 1339   AST 18 09/13/2019 1339   ALT 21 09/13/2019 1339   ALKPHOS 57 09/13/2019 1339   BILITOT 0.8 09/13/2019 1339   GFRNONAA >60 12/10/2021 1021   GFRNONAA 80 06/29/2015 0847   GFRAA >60 09/22/2019 0038   GFRAA >89 06/29/2015 0847    Assessment:  1.  Rectal bleeding: Most likely from anal fissure seen at time of exam today, this is small and likely has been healing more recent colonoscopy reviewed from last April, this was completely normal 2.  Anal fissure 3.  Grade 1 internal hemorrhoids  Plan: 1.  Prescribed Diltiazem 2% gel 3 times daily x 6-8 weeks and 2.  Encouraged the patient to keep soft solid bowel movements 3.  Patient will call  if her bleeding does not subside over the next 8 weeks.  At that point would need to consider repeat flex sig. 4.  Patient to follow in clinic with [place] as needed.  She was assigned to Dr. Lavon Paganini this afternoon in lieu of Dr. Christella Hartigan absence.  Alicia Meeker, PA-C [facility] 05/25/2022, 1:23 PM  Cc: Doran Stabler, DO

## 2022-05-25 NOTE — Patient Instructions (Addendum)
We have sent a prescription for Diltiazem 2% gel to [facility] for you. Using your index finger, you should apply a small amount of medication inside the rectum up to your first knuckle/joint three times daily x 6-8 weeks.  [facility]'s information is below: Address: [address], [place], [place] [address]  [phone]  *Please DO NOT go directly from our office to pick up this medication! Give the pharmacy 1 day to process the prescription as this is compounded and takes time to make.

## 2022-05-27 ENCOUNTER — Ambulatory Visit: Payer: Commercial Managed Care - PPO | Admitting: Obstetrics and Gynecology

## 2022-05-27 NOTE — Progress Notes (Deleted)
47 y.o. [id number] Single Unavailable Not Hispanic or Latino female here for annual exam.    H/O hysterectomy in 9/21  Patient's last menstrual period was 09/09/2019.          Sexually active: {yes no:314532}  The current method of family planning is {contraception:315051}.    Exercising: {yes no:314532}  {types:19826} Smoker:  {YES [id number]  Health Maintenance: Pap:  06/02/19 normal History of abnormal Pap:  yes MMG:  *** BMD:   *** Colonoscopy: *** TDaP:  *** Gardasil: ***   reports that she has never smoked. She has never used smokeless tobacco. She reports that she does not drink alcohol and does not use drugs. She is a MA in primary care. She has 3 kids.   Past Medical History:  Diagnosis Date   Allergy    Anemia    younger   Constipation    Depression    Diabetes mellitus without complication, without long-term current use of insulin (HCC)    Elevated cholesterol    Family history of breast cancer    Family history of prostate cancer    GERD (gastroesophageal reflux disease)    Heart murmur    "begnin"   Hypertension    PTSD (post-traumatic stress disorder)    Vertigo     Past Surgical History:  Procedure Laterality Date   CYSTOSCOPY N/A 09/17/2019   Procedure: CYSTOSCOPY;  Surgeon: Romualdo Bolk, MD;  Location: [facility];  Service: Gynecology;  Laterality: N/A;   HEMI-MICRODISCECTOMY LUMBAR LAMINECTOMY LEVEL 1 Left 12/10/2021   Procedure: L5-S1 LEFT SIDED HEMILAMINECTOMY AND DISCECTOMY;  Surgeon: London Sheer, MD;  Location: [facility] OR;  Service: Orthopedics;  Laterality: Left;   TOTAL LAPAROSCOPIC HYSTERECTOMY WITH SALPINGECTOMY N/A 09/17/2019   Procedure: TOTAL LAPAROSCOPIC HYSTERECTOMY, BILATERAL SALPINGECTOMY;  Surgeon: Romualdo Bolk, MD;  Location: [facility];  Service: Gynecology;  Laterality: N/A;    Current Outpatient Medications  Medication Sig Dispense Refill   cetirizine (ZYRTEC) 10 MG tablet Take 20 mg by  mouth daily.     diltiazem 2 % GEL Using your index finger, apply a small amount of medication inside the rectum up to your first knuckle/joint three times daily x 6-8 weeks. 45 g 1   hydrochlorothiazide (HYDRODIURIL) 12.5 MG tablet Take 1 tablet (12.5 mg total) by mouth daily. 90 tablet 2   Insulin Pen Needle (UNIFINE PENTIPS) 32G X 4 MM MISC Use daily as directed 100 each 3   losartan (COZAAR) 50 MG tablet Take 1 tablet by mouth daily 90 tablet 2   Oxymetazoline HCl (VICKS SINEX 12 HOUR NA) Place 1 spray into the nose daily as needed (congestion).     rosuvastatin (CRESTOR) 20 MG tablet Take 1 tablet (20 mg total) by mouth daily. 90 tablet 3   Semaglutide,0.25 or 0.5MG /DOS, 2 MG/3ML SOPN Inject 0.5 mg into the skin once a week for 28 days. 3 mL 0   No current facility-administered medications for this visit.    Family History  Problem Relation Age of Onset   Diabetes Mother    Heart attack Father    Cancer Maternal Uncle        bone cancer    Heart disease Maternal Uncle    Prostate cancer Maternal Uncle 48   Breast cancer Paternal Aunt        dx early 58s   Other Paternal Aunt        has breast lumps, watching her closely   Prostate cancer  Paternal Uncle 91       dx 2nd time in his 39s   Dementia Maternal Grandmother    Breast cancer Paternal Grandmother 50       d. in her 38s   Breast cancer Cousin 47       mother's maternal first cousin   Breast cancer Maternal Great-grandmother    Breast cancer Other        PGMs sister dx in her 69s   Colon polyps Neg Hx    Colon cancer Neg Hx     Review of Systems  Exam:   LMP 09/09/2019   Weight change: @WEIGHTCHANGE @ Height:      Ht Readings from Last 3 Encounters:  05/25/22 5\' 8"  (1.727 m)  05/17/22 5\' 8"  (1.727 m)  05/11/22 5\' 8"  (1.727 m)    General appearance: alert, cooperative and appears stated age Head: Normocephalic, without obvious abnormality, atraumatic Neck: no adenopathy, supple, symmetrical, trachea midline  and thyroid {CHL AMB PHY EX THYROID NORM [phone]::"normal to inspection and palpation"} Lungs: clear to auscultation bilaterally Cardiovascular: regular rate and rhythm Breasts: {Exam; breast:13139::"normal appearance, no masses or tenderness"} Abdomen: soft, non-tender; non distended,  no masses,  no organomegaly Extremities: extremities normal, atraumatic, no cyanosis or edema Skin: Skin color, texture, turgor normal. No rashes or lesions Lymph nodes: Cervical, supraclavicular, and axillary nodes normal. No abnormal inguinal nodes palpated Neurologic: Grossly normal   Pelvic: External genitalia:  no lesions              Urethra:  normal appearing urethra with no masses, tenderness or lesions              Bartholins and Skenes: normal                 Vagina: normal appearing vagina with normal color and discharge, no lesions              Cervix: {CHL AMB PHY EX CERVIX NORM DEFAULT:[phone]::"no lesions"}               Bimanual Exam:  Uterus:  {CHL AMB PHY EX UTERUS NORM DEFAULT:[phone]::"normal size, contour, position, consistency, mobility, non-tender"}              Adnexa: {CHL AMB PHY EX ADNEXA NO MASS DEFAULT:[phone]::"no mass, fullness, tenderness"}               Rectovaginal: Confirms               Anus:  normal sphincter tone, no lesions  *** chaperoned for the exam.  A:  Well Woman with normal exam  P:

## 2022-05-30 ENCOUNTER — Other Ambulatory Visit: Payer: Self-pay | Admitting: Internal Medicine

## 2022-05-30 ENCOUNTER — Other Ambulatory Visit: Payer: Self-pay

## 2022-05-30 ENCOUNTER — Other Ambulatory Visit (HOSPITAL_COMMUNITY): Payer: Self-pay

## 2022-05-30 DIAGNOSIS — I10 Essential (primary) hypertension: Secondary | ICD-10-CM

## 2022-05-31 ENCOUNTER — Encounter: Payer: Self-pay | Admitting: Dietician

## 2022-06-01 ENCOUNTER — Other Ambulatory Visit (HOSPITAL_COMMUNITY): Payer: Self-pay

## 2022-06-01 MED ORDER — LOSARTAN POTASSIUM 50 MG PO TABS
50.0000 mg | ORAL_TABLET | Freq: Every day | ORAL | 2 refills | Status: DC
Start: 2022-06-01 — End: 2023-03-13
  Filled 2022-06-01: qty 90, 90d supply, fill #0
  Filled 2022-09-06: qty 90, 90d supply, fill #1
  Filled 2022-12-12 (×2): qty 90, 90d supply, fill #2

## 2022-06-07 ENCOUNTER — Other Ambulatory Visit: Payer: Self-pay | Admitting: Student

## 2022-06-07 ENCOUNTER — Other Ambulatory Visit (HOSPITAL_COMMUNITY): Payer: Self-pay

## 2022-06-07 DIAGNOSIS — E1169 Type 2 diabetes mellitus with other specified complication: Secondary | ICD-10-CM

## 2022-06-07 MED ORDER — SEMAGLUTIDE (1 MG/DOSE) 4 MG/3ML ~~LOC~~ SOPN
1.0000 mg | PEN_INJECTOR | SUBCUTANEOUS | 2 refills | Status: DC
Start: 2022-06-07 — End: 2022-07-15
  Filled 2022-06-07 – 2022-06-15 (×2): qty 3, 28d supply, fill #0
  Filled 2022-07-07 – 2022-07-15 (×2): qty 3, 28d supply, fill #1

## 2022-06-07 NOTE — Progress Notes (Signed)
Patient tolerated Ozempic 0.5 mg dose without any side effects.  Sent in 1 mg dose.

## 2022-06-08 ENCOUNTER — Other Ambulatory Visit: Payer: Self-pay | Admitting: Internal Medicine

## 2022-06-08 DIAGNOSIS — I10 Essential (primary) hypertension: Secondary | ICD-10-CM

## 2022-06-09 ENCOUNTER — Other Ambulatory Visit (HOSPITAL_COMMUNITY): Payer: Self-pay

## 2022-06-09 MED ORDER — HYDROCHLOROTHIAZIDE 12.5 MG PO TABS
12.5000 mg | ORAL_TABLET | Freq: Every day | ORAL | 2 refills | Status: DC
Start: 2022-06-09 — End: 2023-03-08
  Filled 2022-06-09: qty 90, 90d supply, fill #0
  Filled 2022-09-06: qty 90, 90d supply, fill #1
  Filled 2022-12-12 (×3): qty 90, 90d supply, fill #2

## 2022-06-10 ENCOUNTER — Other Ambulatory Visit (HOSPITAL_COMMUNITY): Payer: Self-pay

## 2022-06-14 ENCOUNTER — Other Ambulatory Visit (HOSPITAL_COMMUNITY): Payer: Self-pay

## 2022-06-14 ENCOUNTER — Other Ambulatory Visit: Payer: Self-pay

## 2022-06-15 ENCOUNTER — Other Ambulatory Visit: Payer: Self-pay

## 2022-06-15 ENCOUNTER — Other Ambulatory Visit (HOSPITAL_COMMUNITY): Payer: Self-pay

## 2022-07-07 ENCOUNTER — Other Ambulatory Visit (HOSPITAL_COMMUNITY): Payer: Self-pay

## 2022-07-15 ENCOUNTER — Other Ambulatory Visit: Payer: Self-pay | Admitting: Internal Medicine

## 2022-07-15 ENCOUNTER — Other Ambulatory Visit (HOSPITAL_COMMUNITY): Payer: Self-pay

## 2022-07-15 DIAGNOSIS — E1169 Type 2 diabetes mellitus with other specified complication: Secondary | ICD-10-CM

## 2022-07-15 MED ORDER — SEMAGLUTIDE (1 MG/DOSE) 4 MG/3ML ~~LOC~~ SOPN
1.0000 mg | PEN_INJECTOR | SUBCUTANEOUS | 3 refills | Status: DC
Start: 2022-07-15 — End: 2022-11-04
  Filled 2022-07-15: qty 3, 28d supply, fill #0
  Filled 2022-08-04 – 2022-08-08 (×2): qty 3, 28d supply, fill #1
  Filled 2022-08-25 – 2022-08-31 (×3): qty 3, 28d supply, fill #2
  Filled 2022-09-22 – 2022-09-26 (×2): qty 3, 28d supply, fill #3

## 2022-07-15 NOTE — Progress Notes (Signed)
Ozempic 1 mg refilled and sent to [facility]

## 2022-08-04 ENCOUNTER — Other Ambulatory Visit: Payer: Self-pay

## 2022-08-08 ENCOUNTER — Other Ambulatory Visit (HOSPITAL_COMMUNITY): Payer: Self-pay

## 2022-08-09 ENCOUNTER — Other Ambulatory Visit (HOSPITAL_COMMUNITY): Payer: Self-pay

## 2022-08-09 ENCOUNTER — Encounter (HOSPITAL_COMMUNITY): Payer: Self-pay

## 2022-08-11 ENCOUNTER — Ambulatory Visit: Payer: 59 | Admitting: Radiology

## 2022-08-11 ENCOUNTER — Encounter: Payer: Self-pay | Admitting: Radiology

## 2022-08-11 VITALS — BP 118/76 | Ht 68.5 in | Wt 218.0 lb

## 2022-08-11 DIAGNOSIS — Z01419 Encounter for gynecological examination (general) (routine) without abnormal findings: Secondary | ICD-10-CM

## 2022-08-11 NOTE — Progress Notes (Signed)
   Alicia Obrien 1975/08/25 [id number]   History:  47 y.o. G0 presents for annual exam. No gyn concerns. Up to date on screenings. Works as a Scientist, water quality.  Gynecologic History Hysterectomy: TLH/BS 2021  Sexually active: yes  Health Maintenance Last Pap: 2021. Results were: normal Last mammogram: 9/23. Results were: normal Last colonoscopy: 2023.    Past medical history, past surgical history, family history and social history were all reviewed and documented in the EPIC chart.  ROS:  A ROS was performed and pertinent positives and negatives are included.  Exam:  Vitals:   08/11/22 1413  BP: 118/76  Weight: 218 lb (98.9 kg)  Height: 5' 8.5" (1.74 m)   Body mass index is 32.66 kg/m.  General appearance:  Normal Thyroid:  Symmetrical, normal in size, without palpable masses or nodularity. Respiratory  Auscultation:  Clear without wheezing or rhonchi Cardiovascular  Auscultation:  Regular rate, without rubs, murmurs or gallops  Edema/varicosities:  Not grossly evident Abdominal  Soft,nontender, without masses, guarding or rebound.  Liver/spleen:  No organomegaly noted  Hernia:  None appreciated  Skin  Inspection:  Grossly normal Breasts: Examined lying and sitting.   Right: Without masses, retractions, nipple discharge or axillary adenopathy.   Left: Without masses, retractions, nipple discharge or axillary adenopathy. Genitourinary   Inguinal/mons:  Normal without inguinal adenopathy  External genitalia:  Normal appearing vulva with no masses, tenderness, or lesions  BUS/Urethra/Skene's glands:  Normal  Vagina:  Normal appearing with normal color and discharge, no lesions.   Cervix:  absent  Uterus:  absent  Adnexa/parametria:     Rt: Normal in size, without masses or tenderness.   Lt: Normal in size, without masses or tenderness.  Anus and perineum: Normal   Raynelle Fanning, CMA present for exam  Assessment/Plan:   1. Well woman exam with routine  gynecological exam Pap up to date Mammo due next month Up to date on colonoscopy Labs with PCP     Discussed SBE, colonoscopy and mammogram screening as appropriate. Encouraged 158mins/week of cardiovascular and weight bearing exercise minimum.  Return in 1 year for annual or sooner prn.  Arlie Solomons B WHNP-BC 2:25 PM 08/11/2022

## 2022-08-25 ENCOUNTER — Other Ambulatory Visit (HOSPITAL_COMMUNITY): Payer: Self-pay

## 2022-08-30 ENCOUNTER — Other Ambulatory Visit (HOSPITAL_COMMUNITY): Payer: Self-pay

## 2022-09-06 ENCOUNTER — Other Ambulatory Visit: Payer: Self-pay | Admitting: Internal Medicine

## 2022-09-06 ENCOUNTER — Other Ambulatory Visit (HOSPITAL_COMMUNITY): Payer: Self-pay

## 2022-09-06 ENCOUNTER — Other Ambulatory Visit: Payer: Self-pay

## 2022-09-06 DIAGNOSIS — E1169 Type 2 diabetes mellitus with other specified complication: Secondary | ICD-10-CM

## 2022-09-08 ENCOUNTER — Other Ambulatory Visit (HOSPITAL_COMMUNITY): Payer: Self-pay

## 2022-09-08 MED ORDER — ROSUVASTATIN CALCIUM 20 MG PO TABS
20.0000 mg | ORAL_TABLET | Freq: Every day | ORAL | 3 refills | Status: DC
Start: 2022-09-08 — End: 2023-09-14
  Filled 2022-09-08: qty 90, 90d supply, fill #0
  Filled 2022-12-12 (×2): qty 90, 90d supply, fill #1
  Filled 2023-03-08: qty 90, 90d supply, fill #2
  Filled 2023-06-05 – 2023-06-08 (×3): qty 90, 90d supply, fill #3

## 2022-09-13 ENCOUNTER — Telehealth: Payer: Self-pay | Admitting: Internal Medicine

## 2022-09-13 DIAGNOSIS — Z1231 Encounter for screening mammogram for malignant neoplasm of breast: Secondary | ICD-10-CM

## 2022-09-13 NOTE — Telephone Encounter (Signed)
Pt is requesting to have her annual mammogram with [facility] imaging. Can a new order be placed ?

## 2022-09-22 ENCOUNTER — Other Ambulatory Visit (HOSPITAL_COMMUNITY): Payer: Self-pay

## 2022-09-27 ENCOUNTER — Other Ambulatory Visit: Payer: Self-pay

## 2022-09-27 ENCOUNTER — Other Ambulatory Visit (HOSPITAL_COMMUNITY): Payer: Self-pay

## 2022-09-27 ENCOUNTER — Other Ambulatory Visit: Payer: Self-pay | Admitting: Internal Medicine

## 2022-09-27 ENCOUNTER — Ambulatory Visit
Admission: RE | Admit: 2022-09-27 | Discharge: 2022-09-27 | Disposition: A | Discharge status: Home / Self Care | Payer: 59 | Source: Ambulatory Visit | Attending: Internal Medicine | Admitting: Internal Medicine

## 2022-09-27 DIAGNOSIS — Z1231 Encounter for screening mammogram for malignant neoplasm of breast: Secondary | ICD-10-CM | POA: Diagnosis not present

## 2022-09-27 DIAGNOSIS — E1169 Type 2 diabetes mellitus with other specified complication: Secondary | ICD-10-CM

## 2022-09-27 MED ORDER — SEMAGLUTIDE (1 MG/DOSE) 4 MG/3ML ~~LOC~~ SOPN
1.0000 mg | PEN_INJECTOR | SUBCUTANEOUS | 3 refills | Status: DC
Start: 2022-09-27 — End: 2023-04-12
  Filled 2022-09-27 – 2022-10-21 (×2): qty 3, 28d supply, fill #0
  Filled 2022-11-17: qty 3, 28d supply, fill #1
  Filled 2022-12-15: qty 3, 28d supply, fill #2

## 2022-09-27 NOTE — Progress Notes (Signed)
Semaglutide refilled

## 2022-10-17 DIAGNOSIS — Z803 Family history of malignant neoplasm of breast: Secondary | ICD-10-CM

## 2022-10-17 DIAGNOSIS — Z9189 Other specified personal risk factors, not elsewhere classified: Secondary | ICD-10-CM

## 2022-10-17 NOTE — Telephone Encounter (Signed)
AEX 08/11/22 -JC  MMG 09/27/22 -BiRads 1 neg Screening 1 year  MRI Breast 07/06/21 - RECOMMENDATION: 1. Annual screening mammography. 2. Supplemental high risk annual screening breast MRI should be considered for patients with a greater than 20% lifetime risk developing breast carcinoma according to current ACR/ACS recommendations.   BI-RADS CATEGORY  2: Benign.   Per review of MRI 05/30/20, LTR 22.4%   Jami -please review and advise on bilateral breast MRI.

## 2022-10-18 NOTE — Telephone Encounter (Signed)
Reviewed records, please order breast MRI with contrast, elevated lifetime risk >20%. Thanks

## 2022-10-21 ENCOUNTER — Other Ambulatory Visit (HOSPITAL_COMMUNITY): Payer: Self-pay

## 2022-10-21 ENCOUNTER — Other Ambulatory Visit: Payer: Self-pay

## 2022-10-24 ENCOUNTER — Other Ambulatory Visit (HOSPITAL_COMMUNITY): Payer: Self-pay

## 2022-11-17 ENCOUNTER — Other Ambulatory Visit (HOSPITAL_BASED_OUTPATIENT_CLINIC_OR_DEPARTMENT_OTHER): Payer: Self-pay

## 2022-11-17 ENCOUNTER — Encounter: Payer: 59 | Admitting: Internal Medicine

## 2022-11-26 ENCOUNTER — Inpatient Hospital Stay: Admission: RE | Admit: 2022-11-26 | Payer: 59 | Source: Ambulatory Visit

## 2022-11-28 ENCOUNTER — Ambulatory Visit: Payer: 59 | Admitting: Student

## 2022-11-28 ENCOUNTER — Other Ambulatory Visit: Payer: Self-pay

## 2022-11-28 ENCOUNTER — Other Ambulatory Visit (HOSPITAL_COMMUNITY): Payer: Self-pay

## 2022-11-28 VITALS — BP 129/83 | HR 82 | Temp 98.4°F | Wt 214.5 lb

## 2022-11-28 DIAGNOSIS — E669 Obesity, unspecified: Secondary | ICD-10-CM | POA: Diagnosis not present

## 2022-11-28 DIAGNOSIS — I10 Essential (primary) hypertension: Secondary | ICD-10-CM

## 2022-11-28 DIAGNOSIS — F32A Depression, unspecified: Secondary | ICD-10-CM

## 2022-11-28 DIAGNOSIS — E785 Hyperlipidemia, unspecified: Secondary | ICD-10-CM

## 2022-11-28 DIAGNOSIS — Z6832 Body mass index (BMI) 32.0-32.9, adult: Secondary | ICD-10-CM | POA: Diagnosis not present

## 2022-11-28 DIAGNOSIS — E119 Type 2 diabetes mellitus without complications: Secondary | ICD-10-CM

## 2022-11-28 DIAGNOSIS — Z7985 Long-term (current) use of injectable non-insulin antidiabetic drugs: Secondary | ICD-10-CM

## 2022-11-28 LAB — POCT GLYCOSYLATED HEMOGLOBIN (HGB A1C): Hemoglobin A1C: 5.8 % — AB (ref 4.0–5.6)

## 2022-11-28 LAB — GLUCOSE, CAPILLARY: Glucose-Capillary: 93 mg/dL (ref 70–99)

## 2022-11-28 IMAGING — MR MR ELBOW*L* W/O CM
4 of 5 series · 20 of 40 positions shown · non-contrast
Comparison: X-ray 06/25/2020

CLINICAL DATA: Lateral elbow pain for 5 months.  No known injury.

EXAM:
MRI OF THE LEFT ELBOW WITHOUT CONTRAST
TECHNIQUE: Multiplanar, multisequence MR imaging of the elbow was performed. No
intravenous contrast was administered.

[Series 3: T1 · axial · 3.5mm · 0.27mm/px · z∈[-32,+46]mm · 3 of 23 slices shown]
[im 3/23]
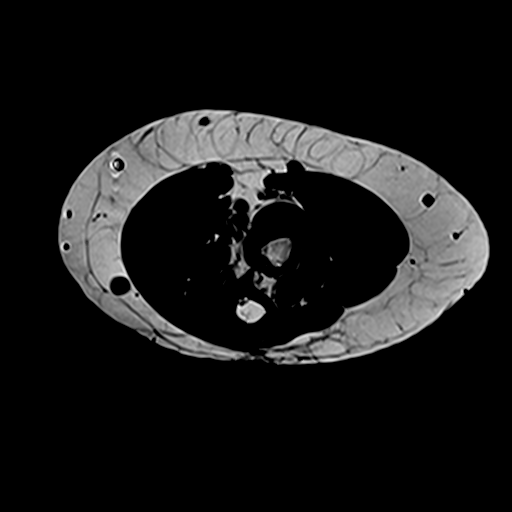
[im 12/23]
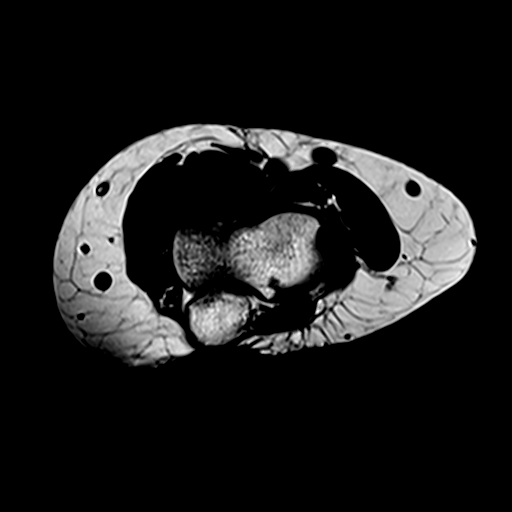
[im 20/23]
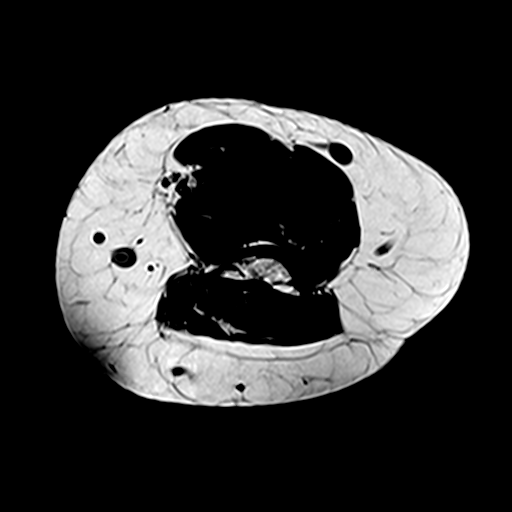

[Series 4: T2 fat-sat · axial · 3.5mm · 0.27mm/px · z∈[-41,+60]mm · 9 of 23 slices shown (1 of 3)]
[im 1/23]
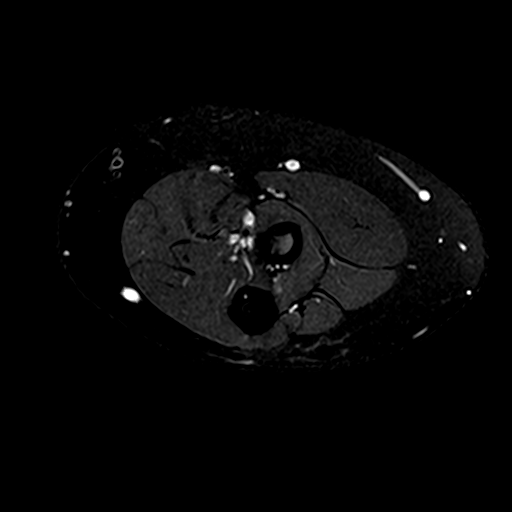
[im 3/23]
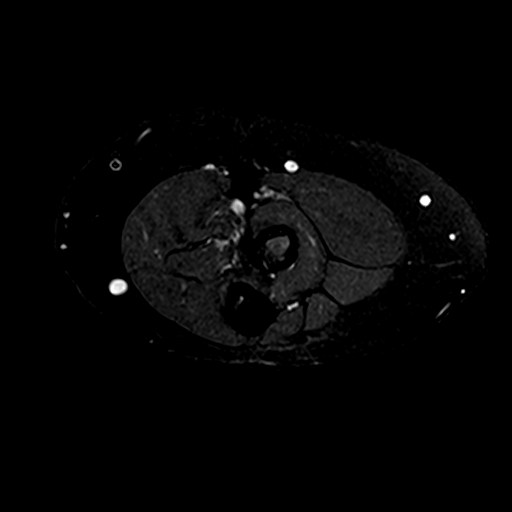
[im 6/23]
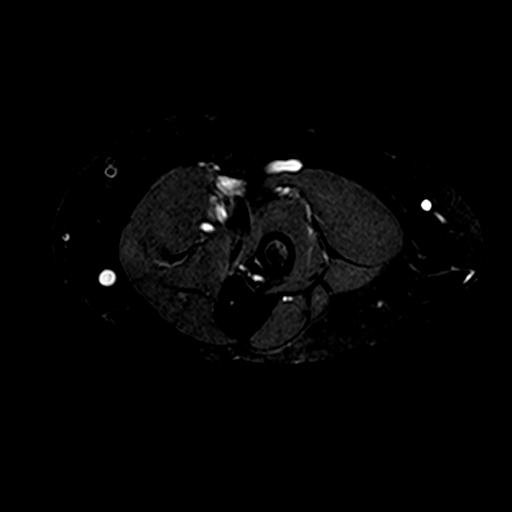
[im 9/23]
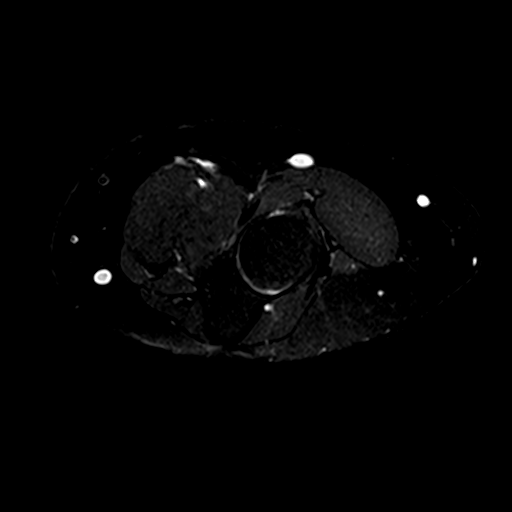
[im 12/23]
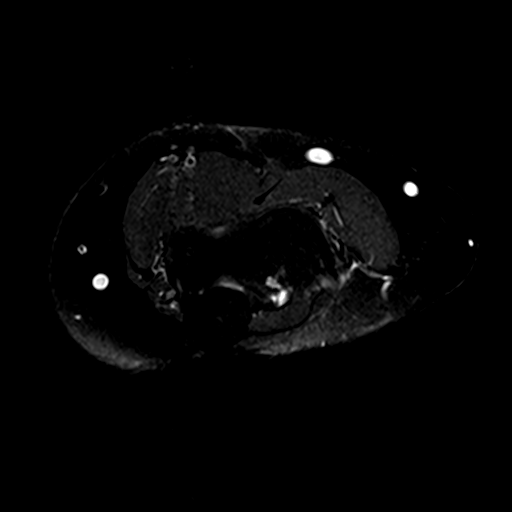
[im 14/23]
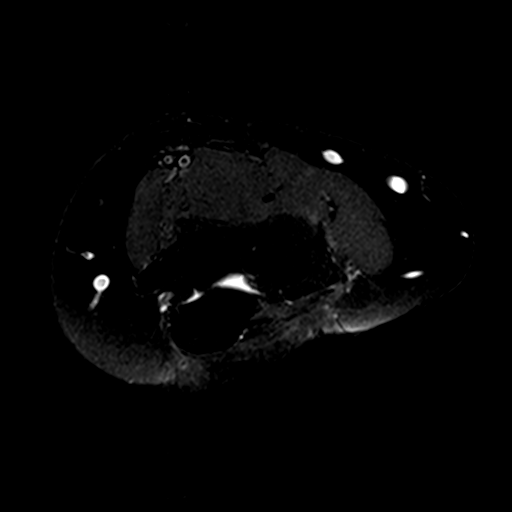
[im 17/23]
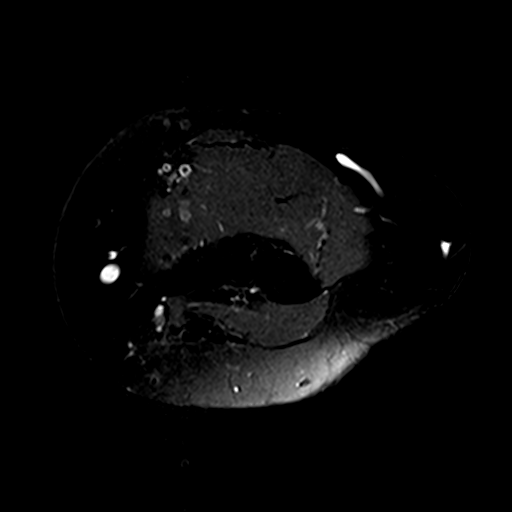
[im 20/23]
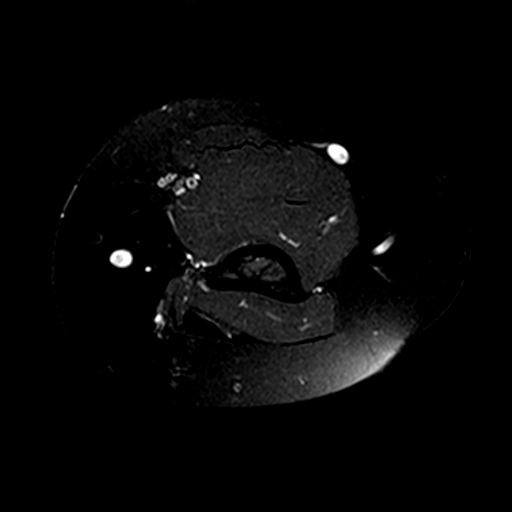
[im 23/23]
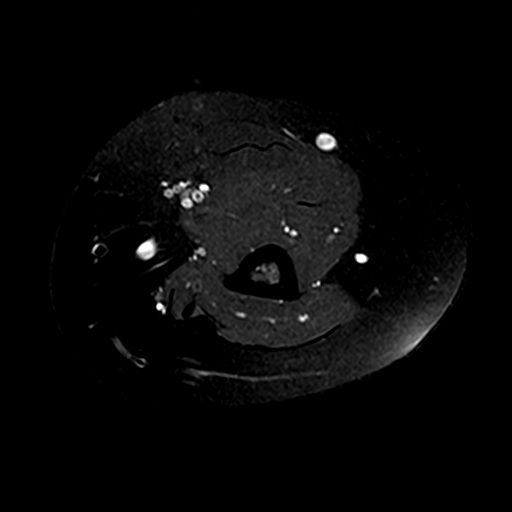

[Series 6: T2 fat-sat · coronal · 3.5mm · 0.27mm/px · 5 of 16 slices shown (2 of 3)]
[im 1/16]
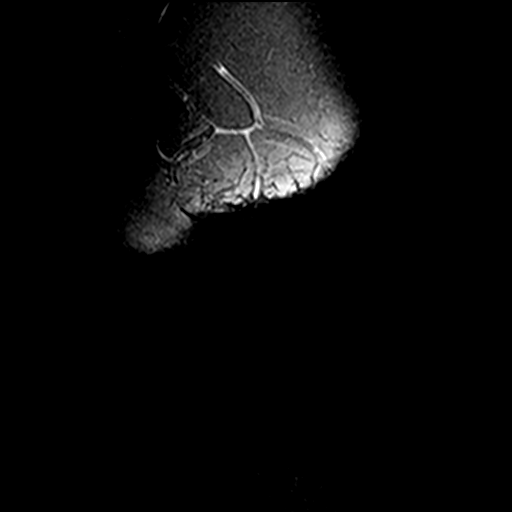
[im 3/16]
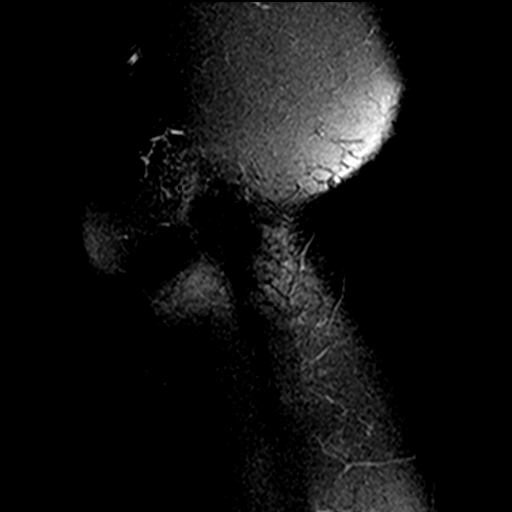
[im 6/16]
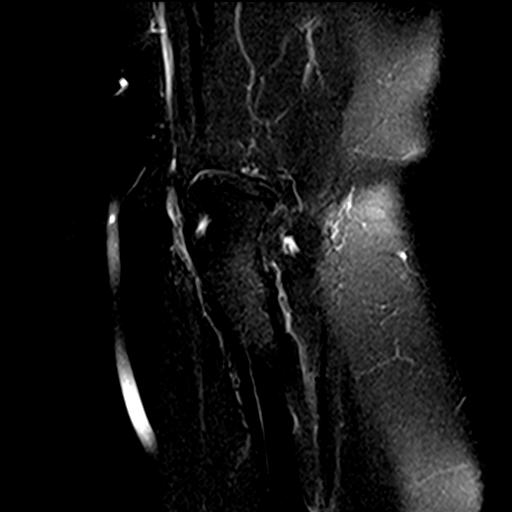
[im 8/16]
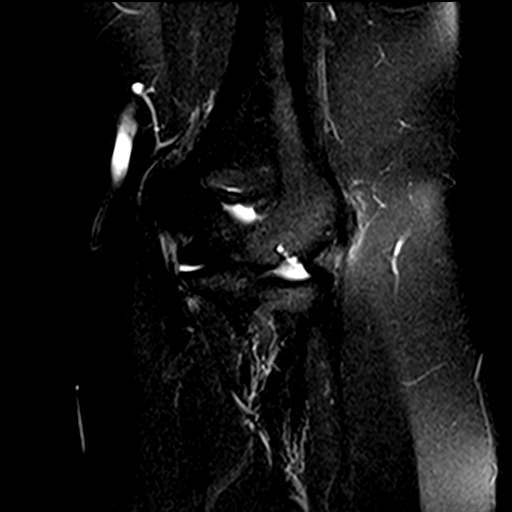
[im 13/16]
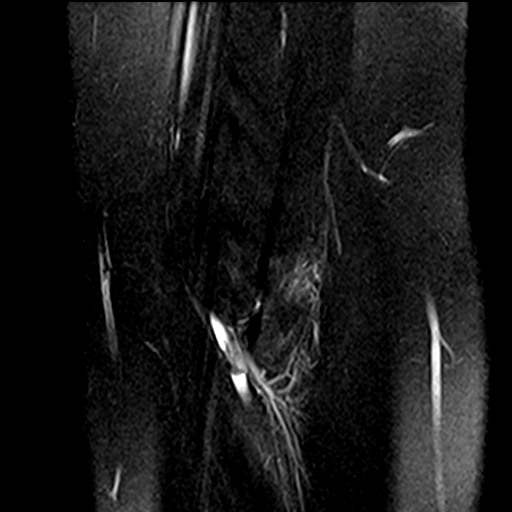

[Series 8: T2 fat-sat · sagittal · 3.5mm · 0.31mm/px · 3 of 19 slices shown (3 of 3)]
[im 3/19]
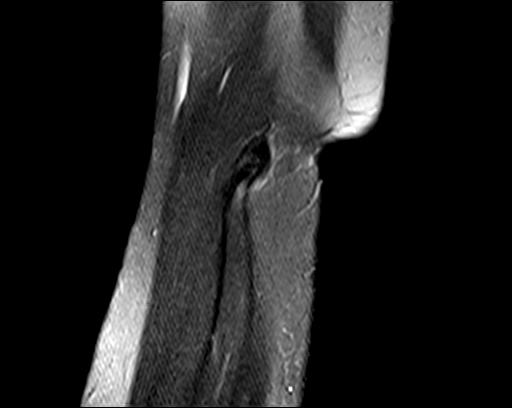
[im 11/19]
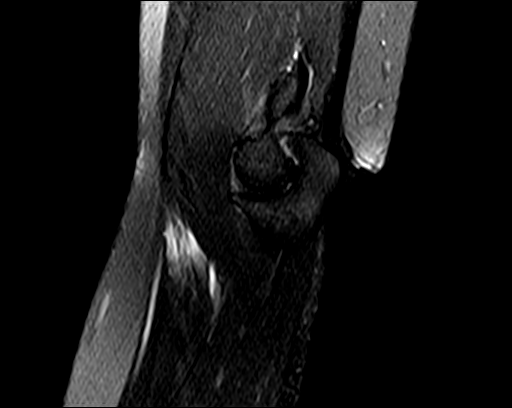
[im 16/19]
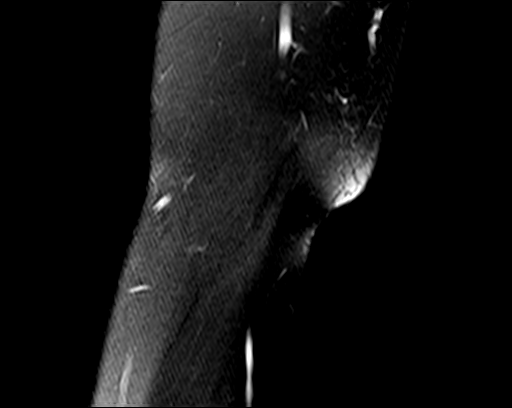

[20 of 40 positions shown; findings below may reference images not displayed]

FINDINGS: TENDONS

Common forearm flexor origin: Intact without tendinosis or tear.

Common forearm extensor origin: Moderate tendinosis of the common
extensor tendon origin with low-grade interstitial tears and
adjacent peritendinous edema. No full-thickness or retracted tear.

Biceps: Intact without tendinosis or tear. No bicipitoradialis
bursal fluid.

Triceps: Intact.

LIGAMENTS

Medial stabilizers: Intact.

Lateral stabilizers:  Intact.

Cartilage: No chondral defect or osteochondral lesion.

Joint: No joint effusion.

Cubital tunnel: Cubital tunnel and ulnar nerve are within normal
limits.

Bones: No acute fracture. No dislocation. No bone marrow edema. No
suspicious bone lesion.

Other: Normal muscle bulk and signal intensity. No soft tissue edema
or fluid collection.
IMPRESSION: 1. Findings of lateral epicondylitis.
2. No acute bony findings. Specifically, no evidence of an
osteochondral lesion.

## 2022-11-28 MED ORDER — OZEMPIC (2 MG/DOSE) 8 MG/3ML ~~LOC~~ SOPN
2.0000 mg | PEN_INJECTOR | SUBCUTANEOUS | 3 refills | Status: DC
  Filled 2022-11-28 – 2022-12-12 (×5): qty 3, 28d supply, fill #0
  Filled 2023-01-08 – 2023-01-09 (×2): qty 3, 28d supply, fill #1
  Filled 2023-02-01: qty 3, 28d supply, fill #2
  Filled 2023-03-08: qty 3, 28d supply, fill #3

## 2022-11-28 NOTE — Assessment & Plan Note (Signed)
Adherent to medication. Given polyphagia, will check lipid panel today to monitor and assess need for increasing therapy - lipid panel today -Continue Crestor 20 mg daily

## 2022-11-28 NOTE — Assessment & Plan Note (Addendum)
Negative PHQ-9 today. Her granbaby is a source of joy. Has been managing stress and does not identify decrease mood in the past few months. Normal sleep routine and hygiene.  -In remission -Continue monitoring

## 2022-11-28 NOTE — Progress Notes (Signed)
Subjective:  CC: Obesity and diabetes follow up  HPI:  Alicia Obrien is a 47 y.o. female with a past medical history stated below and presents today for diabetes, HTN, obesity follow up. Please see problem based assessment and plan for additional details.  Past Medical History:  Diagnosis Date   Allergy    Anemia    younger   Constipation    Depression    Diabetes mellitus without complication, without long-term current use of insulin (HCC)    Elevated cholesterol    Family history of breast cancer    Family history of prostate cancer    GERD (gastroesophageal reflux disease)    Heart murmur    "begnin"   Hypertension    PTSD (post-traumatic stress disorder)    Vertigo     Current Outpatient Medications on File Prior to Visit  Medication Sig Dispense Refill   cetirizine (ZYRTEC) 10 MG tablet Take 20 mg by mouth daily.     hydrochlorothiazide (HYDRODIURIL) 12.5 MG tablet Take 1 tablet (12.5 mg total) by mouth daily. 90 tablet 2   Insulin Pen Needle (UNIFINE PENTIPS) 32G X 4 MM MISC Use daily as directed 100 each 3   losartan (COZAAR) 50 MG tablet Take 1 tablet by mouth daily 90 tablet 2   Oxymetazoline HCl (VICKS SINEX 12 HOUR NA) Place 1 spray into the nose daily as needed (congestion). (Patient not taking: Reported on 08/11/2022)     rosuvastatin (CRESTOR) 20 MG tablet Take 1 tablet (20 mg total) by mouth daily. 90 tablet 3   Semaglutide, 1 MG/DOSE, 4 MG/3ML SOPN Inject 1 mg into the skin once a week. 3 mL 3   No current facility-administered medications on file prior to visit.    Family History  Problem Relation Age of Onset   Diabetes Mother    Heart attack Father    Cancer Maternal Uncle        bone cancer    Heart disease Maternal Uncle    Prostate cancer Maternal Uncle 74   Parkinson's disease Maternal Uncle    Breast cancer Paternal Aunt        dx early 70s   Other Paternal Aunt        has breast lumps, watching her closely   Prostate cancer  Paternal Uncle 24       dx 2nd time in his 85s   Dementia Maternal Grandmother    Breast cancer Paternal Grandmother 50       d. in her 49s   Breast cancer Cousin 12       mother's maternal first cousin   Breast cancer Maternal Great-grandmother    Breast cancer Other        PGMs sister dx in her 84s   Colon polyps Neg Hx    Colon cancer Neg Hx     Social History   Socioeconomic History   Marital status: Single    Spouse name: Not on file   Number of children: 3   Years of education: 16   Highest education level: Not on file  Occupational History   Not on file  Tobacco Use   Smoking status: Never    Passive exposure: Never   Smokeless tobacco: Never  Vaping Use   Vaping status: Never Used  Substance and Sexual Activity   Alcohol use: No   Drug use: No   Sexual activity: Yes    Partners: Male    Comment: Hysterectomy, menarche 47yo,  sexual debut 47yo  Other Topics Concern   Not on file  Social History Narrative   Not on file   Social Determinants of Health   Financial Resource Strain: Low Risk  (02/23/2022)   Overall Financial Resource Strain (CARDIA)    Difficulty of Paying Living Expenses: Not hard at all  Food Insecurity: No Food Insecurity (02/23/2022)   Hunger Vital Sign    Worried About Running Out of Food in the Last Year: Never true    Ran Out of Food in the Last Year: Never true  Transportation Needs: No Transportation Needs (02/23/2022)   PRAPARE - Administrator, Civil Service (Medical): No    Lack of Transportation (Non-Medical): No  Physical Activity: Sufficiently Active (02/23/2022)   Exercise Vital Sign    Days of Exercise per Week: 5 days    Minutes of Exercise per Session: 40 min  Stress: No Stress Concern Present (02/23/2022)   [employer] of Occupational Health - Occupational Stress Questionnaire    Feeling of Stress : Not at all  Social Connections: Moderately Isolated (02/23/2022)   Social Connection and Isolation Panel  [NHANES]    Frequency of Communication with Friends and Family: More than three times a week    Frequency of Social Gatherings with Friends and Family: More than three times a week    Attends Religious Services: 1 to 4 times per year    Active Member of [employer] or Organizations: No    Attends Banker Meetings: Never    Marital Status: Never married  Intimate Partner Violence: Not At Risk (02/23/2022)   Humiliation, Afraid, Rape, and Kick questionnaire    Fear of Current or Ex-Partner: No    Emotionally Abused: No    Physically Abused: No    Sexually Abused: No    Review of Systems: ROS negative except for what is noted on the assessment and plan.  Objective:   Vitals:   11/28/22 0900  BP: 129/83  Pulse: 82  Temp: 98.4 F (36.9 C)  TempSrc: Oral  SpO2: 99%  Weight: 214 lb 8 oz (97.3 kg)    Physical Exam: Constitutional: well-appearing woman sitting in chair, in no acute distress HENT: normocephalic atraumatic, mucous membranes moist Eyes: conjunctiva non-erythematous Neck: supple Cardiovascular: regular rate and rhythm, no m/r/g Pulmonary/Chest: normal work of breathing on room air, lungs clear to auscultation bilaterally Abdominal: soft, non-tender, non-distended MSK: normal bulk and tone Neurological: alert & oriented x 3, normal gait Skin: warm and dry, surgical scar in lower back Psych: Pleasant mood and affect       11/28/2022    9:04 AM  Depression screen PHQ 2/9  Decreased Interest 0  Down, Depressed, Hopeless 0  PHQ - 2 Score 0    Assessment & Plan:   Obesity with serious comorbidity Patient presented in follow up for obesity treatment. She has noticed that has slowly increased in weight for the past month or so. This is due to increased appetite and minimal physical activity. PHQ-2 negative for depression or stress contributing tot his. Discussed dietary modifications and compromised to 3 sessions per week of cardiovascular activitiy for at  least 30 min and one of strength training as she just joined a gym. -Increase ozempic to 2mg  weekly -Monitor for side effects at next OP in one month -Weight today 214, continue monitor -Reinforce life style modifications  Type 2 diabetes mellitus (HCC) Last A1c:  Lab Results  Component Value Date   HGBA1C 6.4 (  A) 05/17/2022   Diet: Increase in appetite, portions and snacks Exercise: Wil start 30 min cardio 3x /week to start and one session Last Cr and GFR: 6 months ago wnl Last Urine microalbumin: 5 months , within normal levels Lipid panel: repeat today; on Crestor 20 mg HTN: controlled on ARB and HCTZ Ophtho; up-to-date Foot/podiatry; today, normal Current medications Ozempic 1 mg weekly Plan: - Montinor renal function today - Urine microalbumin creatinine ratio monitoring - Increase ozempic to 2 mg weekly - RTC in 1 month for medication monitoring - Life style modifications  Depression Negative PHQ-9 today. Her granbaby is a source of joy. Has been managing stress and does not identify decrease mood in the past few months. Normal sleep routine and hygiene.  -In remission -Continue monitoring  Hyperlipidemia Adherent to medication. Given polyphagia, will check lipid panel today to monitor and assess need for increasing therapy - lipid panel today -Continue Crestor 20 mg daily  Essential hypertension Controlled with current medications -No changes   Return in about 4 weeks (around 12/26/2022) for Ozempic follow up.  Patient discussed with Dr. Burna Forts, MD [facility] Medicine Residency [facility]  11/28/2022, 9:38 AM

## 2022-11-28 NOTE — Patient Instructions (Addendum)
Thank you, Ms.Alona Bene for allowing [place] to provide your care today. Today we discussed your increased appetite, diabetes management, hypertension, and cholesterol levels. We also assessed your for depression.    New medications: -Increase the dose of Ozempic to 2 mg weekly -STOP the other dose of this medication -Monitor your symptoms for signs of stomach uopset, nausea, vomiting. If they occur and persist for more than 2 days or get in the way of your eating, please call us back.  I have ordered the following labs for you:   Lab Orders         Lipid Profile         Glucose, capillary         BMP8+Anion Gap         Microalbumin / Creatinine Urine Ratio         POC Hbg A1C      I will call if any are abnormal. All of your labs can be accessed through "My Chart".   My Chart Access: [email or web address]  Please follow-up in: 1 month for side effect follow up    We look forward to seeing you next time. Please call our clinic at [phone] if you have any questions or concerns. The best time to call is Monday-Friday from 9am-4pm, but there is someone available 24/7. If after hours or the weekend, call the main hospital number and ask for the Internal Medicine Resident On-Call. If you need medication refills, please notify your pharmacy one week in advance and they will send [place] a request.   Thank you for letting us take part in your care. Wishing you the best!  Morene Crocker, MD 11/28/2022, 9:22 AM [facility]

## 2022-11-28 NOTE — Assessment & Plan Note (Addendum)
Last A1c:  Lab Results  Component Value Date   HGBA1C 6.4 (A) 05/17/2022   Diet: Increase in appetite, portions and snacks Exercise: Wil start 30 min cardio 3x /week to start and one session Last Cr and GFR: 6 months ago wnl Last Urine microalbumin: 5 months , within normal levels Lipid panel: repeat today; on Crestor 20 mg HTN: controlled on ARB and HCTZ Ophtho; up-to-date Foot/podiatry; today, normal Current medications Ozempic 1 mg weekly Plan: - Montinor renal function today - Urine microalbumin creatinine ratio monitoring - Increase ozempic to 2 mg weekly - RTC in 1 month for medication monitoring - Life style modifications

## 2022-11-28 NOTE — Assessment & Plan Note (Signed)
Controlled with current medications. No changes.

## 2022-11-28 NOTE — Assessment & Plan Note (Addendum)
Patient presented in follow up for obesity treatment. She has noticed that has slowly increased in weight for the past month or so. This is due to increased appetite and minimal physical activity. PHQ-2 negative for depression or stress contributing tot his. Discussed dietary modifications and compromised to 3 sessions per week of cardiovascular activitiy for at least 30 min and one of strength training as she just joined a gym. -Increase ozempic to 2mg  weekly -Monitor for side effects at next OP in one month -Weight today 214, continue monitor -Reinforce life style modifications

## 2022-11-29 LAB — BMP8+ANION GAP
Anion Gap: 19 mmol/L — ABNORMAL HIGH (ref 10.0–18.0)
BUN/Creatinine Ratio: 21 (ref 9–23)
BUN: 14 mg/dL (ref 6–24)
CO2: 20 mmol/L (ref 20–29)
Calcium: 9.4 mg/dL (ref 8.7–10.2)
Chloride: 103 mmol/L (ref 96–106)
Creatinine, Ser: 0.68 mg/dL (ref 0.57–1.00)
Glucose: 92 mg/dL (ref 70–99)
Potassium: 4.2 mmol/L (ref 3.5–5.2)
Sodium: 142 mmol/L (ref 134–144)
eGFR: 108 mL/min/{1.73_m2} (ref >59)

## 2022-11-29 LAB — LIPID PANEL
Chol/HDL Ratio: 3.2 ratio (ref 0.0–4.4)
Cholesterol, Total: 180 mg/dL (ref 100–199)
HDL: 57 mg/dL (ref >39)
LDL Chol Calc (NIH): 91 mg/dL (ref 0–99)
Triglycerides: 187 mg/dL — ABNORMAL HIGH (ref 0–149)
VLDL Cholesterol Cal: 32 mg/dL (ref 5–40)

## 2022-11-29 LAB — MICROALBUMIN / CREATININE URINE RATIO
Creatinine, Urine: 203.9 mg/dL
Microalb/Creat Ratio: 5 mg/g{creat} (ref 0–29)
Microalbumin, Urine: 9.7 ug/mL

## 2022-11-29 NOTE — Progress Notes (Signed)
Internal Medicine Clinic Attending  Case discussed with the resident at the time of the visit.  We reviewed the resident's history and exam and pertinent patient test results.  I agree with the assessment, diagnosis, and plan of care documented in the resident's note.  

## 2022-11-29 NOTE — Addendum Note (Signed)
Addended by: Derrek Monaco on: 11/29/2022 09:08 AM   Modules accepted: Level of Service

## 2022-12-12 ENCOUNTER — Other Ambulatory Visit: Payer: Self-pay

## 2022-12-12 ENCOUNTER — Other Ambulatory Visit (HOSPITAL_COMMUNITY): Payer: Self-pay

## 2022-12-15 ENCOUNTER — Other Ambulatory Visit (HOSPITAL_COMMUNITY): Payer: Self-pay

## 2023-01-08 ENCOUNTER — Other Ambulatory Visit (HOSPITAL_COMMUNITY): Payer: Self-pay

## 2023-01-08 ENCOUNTER — Ambulatory Visit
Admission: RE | Admit: 2023-01-08 | Discharge: 2023-01-08 | Disposition: A | Discharge status: Home / Self Care | Payer: 59 | Source: Ambulatory Visit | Attending: Radiology | Admitting: Radiology

## 2023-01-08 DIAGNOSIS — Z803 Family history of malignant neoplasm of breast: Secondary | ICD-10-CM

## 2023-01-08 DIAGNOSIS — Z1239 Encounter for other screening for malignant neoplasm of breast: Secondary | ICD-10-CM | POA: Diagnosis not present

## 2023-01-08 DIAGNOSIS — Z9189 Other specified personal risk factors, not elsewhere classified: Secondary | ICD-10-CM

## 2023-01-08 MED ORDER — GADOPICLENOL 0.5 MMOL/ML IV SOLN
10.0000 mL | Freq: Once | INTRAVENOUS | Status: AC | PRN
  Administered 2023-01-08: 10 mL via INTRAVENOUS

## 2023-01-09 ENCOUNTER — Other Ambulatory Visit (HOSPITAL_COMMUNITY): Payer: Self-pay

## 2023-01-09 ENCOUNTER — Other Ambulatory Visit (HOSPITAL_BASED_OUTPATIENT_CLINIC_OR_DEPARTMENT_OTHER): Payer: Self-pay

## 2023-02-01 ENCOUNTER — Other Ambulatory Visit (HOSPITAL_COMMUNITY): Payer: Self-pay

## 2023-03-08 ENCOUNTER — Other Ambulatory Visit (HOSPITAL_COMMUNITY): Payer: Self-pay

## 2023-03-08 ENCOUNTER — Other Ambulatory Visit: Payer: Self-pay | Admitting: Student in an Organized Health Care Education/Training Program

## 2023-03-08 ENCOUNTER — Other Ambulatory Visit: Payer: Self-pay

## 2023-03-08 DIAGNOSIS — I10 Essential (primary) hypertension: Secondary | ICD-10-CM

## 2023-03-08 MED ORDER — HYDROCHLOROTHIAZIDE 12.5 MG PO TABS
12.5000 mg | ORAL_TABLET | Freq: Every day | ORAL | 1 refills | Status: DC
  Filled 2023-03-08: qty 90, 90d supply, fill #0
  Filled 2023-06-05 – 2023-06-08 (×3): qty 90, 90d supply, fill #1

## 2023-03-13 ENCOUNTER — Other Ambulatory Visit (HOSPITAL_COMMUNITY): Payer: Self-pay

## 2023-03-13 ENCOUNTER — Other Ambulatory Visit: Payer: Self-pay

## 2023-03-13 DIAGNOSIS — I10 Essential (primary) hypertension: Secondary | ICD-10-CM

## 2023-03-13 MED ORDER — LOSARTAN POTASSIUM 50 MG PO TABS
50.0000 mg | ORAL_TABLET | Freq: Every day | ORAL | 2 refills | Status: DC
  Filled 2023-03-13: qty 90, 90d supply, fill #0
  Filled 2023-06-07 – 2023-06-08 (×2): qty 90, 90d supply, fill #1

## 2023-03-13 NOTE — Telephone Encounter (Signed)
 Medication sent to pharmacy

## 2023-03-31 ENCOUNTER — Other Ambulatory Visit: Payer: Self-pay | Admitting: Student

## 2023-03-31 DIAGNOSIS — E119 Type 2 diabetes mellitus without complications: Secondary | ICD-10-CM

## 2023-04-03 ENCOUNTER — Other Ambulatory Visit (HOSPITAL_COMMUNITY): Payer: Self-pay

## 2023-04-03 MED ORDER — OZEMPIC (2 MG/DOSE) 8 MG/3ML ~~LOC~~ SOPN
2.0000 mg | PEN_INJECTOR | SUBCUTANEOUS | 0 refills | Status: DC
  Filled 2023-04-03: qty 3, 28d supply, fill #0

## 2023-04-12 ENCOUNTER — Ambulatory Visit: Admitting: Internal Medicine

## 2023-04-12 VITALS — BP 135/78 | HR 87 | Temp 97.7°F | Ht 68.5 in | Wt 212.1 lb

## 2023-04-12 DIAGNOSIS — E119 Type 2 diabetes mellitus without complications: Secondary | ICD-10-CM | POA: Diagnosis not present

## 2023-04-12 DIAGNOSIS — Z7985 Long-term (current) use of injectable non-insulin antidiabetic drugs: Secondary | ICD-10-CM

## 2023-04-12 LAB — POCT GLYCOSYLATED HEMOGLOBIN (HGB A1C): Hemoglobin A1C: 5.9 % — AB (ref 4.0–5.6)

## 2023-04-12 LAB — GLUCOSE, CAPILLARY: Glucose-Capillary: 107 mg/dL — ABNORMAL HIGH (ref 70–99)

## 2023-04-12 NOTE — Patient Instructions (Addendum)
 Thank you, Ms.Alona Bene for allowing [place] to provide your care today.   Your A1c looked great today! Please continue with ozempic. Follow-up in 6 months. You will be due for blood work at that visit.  I have ordered the following labs for you:   Lab Orders         POC Hbg A1C      Referrals ordered today:    Referral Orders         Ambulatory referral to Ophthalmology      I have ordered the following medication/changed the following medications:   Stop the following medications: There are no discontinued medications.   Start the following medications: No orders of the defined types were placed in this encounter.    Follow up:  3-6 months  We look forward to seeing you next time. Please call our clinic at [phone] if you have any questions or concerns. The best time to call is Monday-Friday from 9am-4pm, but there is someone available 24/7. If after hours or the weekend, call the main hospital number and ask for the Internal Medicine Resident On-Call. If you need medication refills, please notify your pharmacy one week in advance and they will send [place] a request.   Thank you for trusting me with your care. Wishing you the best!   Rudene Christians, DO [facility]

## 2023-04-12 NOTE — Progress Notes (Unsigned)
 Subjective:  CC: diabetes  HPI:  [name] is a 48 y.o. female with a past medical history of type 2 diabetes, hyperlipidemia, and hypertension who presents today for diabetes.   Please see problem based assessment and plan for additional details.  Past Medical History:  Diagnosis Date   Allergy    Anemia    younger   Constipation    Depression    Diabetes mellitus without complication, without long-term current use of insulin (HCC)    Elevated cholesterol    Family history of breast cancer    Family history of prostate cancer    GERD (gastroesophageal reflux disease)    Heart murmur    "begnin"   Hypertension    PTSD (post-traumatic stress disorder)    Vertigo     MEDICATIONS:  Semaglutide 2 mg weekly Chlorothiazide 12.5 mg Crestor 20 mg Losartan 50 mg  Family History  Problem Relation Age of Onset   Diabetes Mother    Heart attack Father    Cancer Maternal Uncle        bone cancer    Heart disease Maternal Uncle    Prostate cancer Maternal Uncle 20   Parkinson's disease Maternal Uncle    Breast cancer Paternal Aunt        dx early 80s   Other Paternal Aunt        has breast lumps, watching her closely   Prostate cancer Paternal Uncle 7       dx 2nd time in his 60s   Dementia Maternal Grandmother    Breast cancer Paternal Grandmother 50       d. in her 62s   Breast cancer Cousin 18       mother's maternal first cousin   Breast cancer Maternal Great-grandmother    Breast cancer Other        PGMs sister dx in her 76s   Colon polyps Neg Hx    Colon cancer Neg Hx     Past Surgical History:  Procedure Laterality Date   CYSTOSCOPY N/A 09/17/2019   Procedure: CYSTOSCOPY;  Surgeon: Romualdo Bolk, MD;  Location: [facility];  Service: Gynecology;  Laterality: N/A;   HEMI-MICRODISCECTOMY LUMBAR LAMINECTOMY LEVEL 1 Left 12/10/2021   Procedure: L5-S1 LEFT SIDED HEMILAMINECTOMY AND DISCECTOMY;  Surgeon: London Sheer,  MD;  Location: [facility] OR;  Service: Orthopedics;  Laterality: Left;   TOTAL LAPAROSCOPIC HYSTERECTOMY WITH SALPINGECTOMY N/A 09/17/2019   Procedure: TOTAL LAPAROSCOPIC HYSTERECTOMY, BILATERAL SALPINGECTOMY;  Surgeon: Romualdo Bolk, MD;  Location: [facility];  Service: Gynecology;  Laterality: N/A;     Social History   Socioeconomic History   Marital status: Single    Spouse name: Not on file   Number of children: 3   Years of education: 16   Highest education level: Not on file  Occupational History   Not on file  Tobacco Use   Smoking status: Never    Passive exposure: Never   Smokeless tobacco: Never  Vaping Use   Vaping status: Never Used  Substance and Sexual Activity   Alcohol use: No   Drug use: No   Sexual activity: Yes    Partners: Male    Comment: Hysterectomy, menarche 48yo, sexual debut 48yo  Other Topics Concern   Not on file  Social History Narrative   Not on file   Social Drivers of Health   Financial Resource Strain: Low Risk  (02/23/2022)   Overall Financial  Resource Strain (CARDIA)    Difficulty of Paying Living Expenses: Not hard at all  Food Insecurity: No Food Insecurity (02/23/2022)   Hunger Vital Sign    Worried About Running Out of Food in the Last Year: Never true    Ran Out of Food in the Last Year: Never true  Transportation Needs: No Transportation Needs (02/23/2022)   PRAPARE - Administrator, Civil Service (Medical): No    Lack of Transportation (Non-Medical): No  Physical Activity: Sufficiently Active (02/23/2022)   Exercise Vital Sign    Days of Exercise per Week: 5 days    Minutes of Exercise per Session: 40 min  Stress: No Stress Concern Present (02/23/2022)   [employer] of Occupational Health - Occupational Stress Questionnaire    Feeling of Stress : Not at all  Social Connections: Moderately Isolated (02/23/2022)   Social Connection and Isolation Panel [NHANES]    Frequency of Communication with  Friends and Family: More than three times a week    Frequency of Social Gatherings with Friends and Family: More than three times a week    Attends Religious Services: 1 to 4 times per year    Active Member of [employer] or Organizations: No    Attends Banker Meetings: Never    Marital Status: Never married  Intimate Partner Violence: Not At Risk (02/23/2022)   Humiliation, Afraid, Rape, and Kick questionnaire    Fear of Current or Ex-Partner: No    Emotionally Abused: No    Physically Abused: No    Sexually Abused: No    Review of Systems: ROS negative except for what is noted on the assessment and plan.  Objective:   Vitals:   04/12/23 1357  BP: 135/78  Pulse: 87  Temp: 97.7 F (36.5 C)  TempSrc: Oral  SpO2: 98%  Weight: 212 lb 1.6 oz (96.2 kg)  Height: 5' 8.5" (1.74 m)    Physical Exam: Constitutional: well-appearing, in no acute distress Cardiovascular: regular rate and rhythm, no m/r/g Pulmonary/Chest: normal work of breathing on room air, lungs clear to auscultation bilaterally Abdominal: soft, non-tender, non-distended MSK: normal bulk and tone Neurological: alert & oriented x 3, normal gait Skin: warm and dry  Assessment & Plan:  Type 2 diabetes mellitus (HCC) Lab Results  Component Value Date   HGBA1C 5.9 (A) 04/12/2023   Diabetes remains well-controlled.  She feels like she is tolerating Ozempic 2 mg weekly well.  Foot exam completed during visit. P: Continue Ozempic 2 mg weekly Could consider seeing if [name] is covered in the future if she is feels like Ozempic is not helping with weight loss goals  Patient discussed with Dr. Marjorie Smolder Macenzie Burford, D.O. [facility]  PGY-3 Pager: [phone]  Phone: [phone] Date 04/13/2023  Time 3:38 PM

## 2023-04-13 NOTE — Assessment & Plan Note (Signed)
 Lab Results  Component Value Date   HGBA1C 5.9 (A) 04/12/2023   Diabetes remains well-controlled.  She feels like she is tolerating Ozempic 2 mg weekly well.  Foot exam completed during visit. P: Continue Ozempic 2 mg weekly Could consider seeing if [name] is covered in the future if she is feels like Ozempic is not helping with weight loss goals

## 2023-04-20 ENCOUNTER — Other Ambulatory Visit (HOSPITAL_BASED_OUTPATIENT_CLINIC_OR_DEPARTMENT_OTHER): Payer: Self-pay

## 2023-04-20 ENCOUNTER — Other Ambulatory Visit (HOSPITAL_COMMUNITY): Payer: Self-pay

## 2023-04-20 ENCOUNTER — Encounter: Payer: Self-pay | Admitting: Orthopedic Surgery

## 2023-04-20 ENCOUNTER — Other Ambulatory Visit: Payer: Self-pay | Admitting: Physical Medicine and Rehabilitation

## 2023-04-20 ENCOUNTER — Telehealth: Payer: Self-pay | Admitting: Physical Medicine and Rehabilitation

## 2023-04-20 DIAGNOSIS — M5416 Radiculopathy, lumbar region: Secondary | ICD-10-CM

## 2023-04-20 DIAGNOSIS — Z9889 Other specified postprocedural states: Secondary | ICD-10-CM

## 2023-04-20 MED ORDER — METHOCARBAMOL 500 MG PO TABS
500.0000 mg | ORAL_TABLET | Freq: Three times a day (TID) | ORAL | 0 refills | Status: DC
  Filled 2023-04-20: qty 90, 30d supply, fill #0

## 2023-04-20 MED ORDER — MELOXICAM 15 MG PO TABS
15.0000 mg | ORAL_TABLET | Freq: Every day | ORAL | 0 refills | Status: AC
  Filled 2023-04-20: qty 30, 30d supply, fill #0

## 2023-04-20 NOTE — Telephone Encounter (Signed)
 Pt called requesting a call or pt would rather a message thru her mychart. Pt states she is in severe pains in her back and down her legs and need a message back ASAP. Please send message if pt can be seen or what she can do about her pains by [facility]. Pt states she can barely bare weight and she is at work right now. Pt phone number is [phone].

## 2023-04-22 NOTE — Progress Notes (Signed)
 Internal Medicine Clinic Attending  Case discussed with the resident at the time of the visit.  We reviewed the resident's history and exam and pertinent patient test results.  I agree with the assessment, diagnosis, and plan of care documented in the resident's note.

## 2023-04-22 NOTE — Progress Notes (Signed)
.  att

## 2023-04-24 ENCOUNTER — Other Ambulatory Visit (HOSPITAL_COMMUNITY): Payer: Self-pay

## 2023-04-24 ENCOUNTER — Other Ambulatory Visit: Payer: Self-pay | Admitting: Internal Medicine

## 2023-04-24 DIAGNOSIS — E119 Type 2 diabetes mellitus without complications: Secondary | ICD-10-CM

## 2023-04-24 MED ORDER — OZEMPIC (2 MG/DOSE) 8 MG/3ML ~~LOC~~ SOPN
2.0000 mg | PEN_INJECTOR | SUBCUTANEOUS | 0 refills | Status: DC
  Filled 2023-05-01: qty 3, 28d supply, fill #0

## 2023-04-24 NOTE — Telephone Encounter (Signed)
 Medication sent to pharmacy

## 2023-04-25 ENCOUNTER — Encounter: Payer: Self-pay | Admitting: Physical Medicine and Rehabilitation

## 2023-04-25 ENCOUNTER — Other Ambulatory Visit (HOSPITAL_COMMUNITY): Payer: Self-pay

## 2023-04-25 ENCOUNTER — Ambulatory Visit (INDEPENDENT_AMBULATORY_CARE_PROVIDER_SITE_OTHER): Admitting: Physical Medicine and Rehabilitation

## 2023-04-25 ENCOUNTER — Other Ambulatory Visit (INDEPENDENT_AMBULATORY_CARE_PROVIDER_SITE_OTHER): Payer: Self-pay

## 2023-04-25 DIAGNOSIS — M5416 Radiculopathy, lumbar region: Secondary | ICD-10-CM

## 2023-04-25 DIAGNOSIS — M5442 Lumbago with sciatica, left side: Secondary | ICD-10-CM | POA: Diagnosis not present

## 2023-04-25 DIAGNOSIS — G8929 Other chronic pain: Secondary | ICD-10-CM

## 2023-04-25 DIAGNOSIS — Z9889 Other specified postprocedural states: Secondary | ICD-10-CM

## 2023-04-25 MED ORDER — PREDNISONE 50 MG PO TABS
50.0000 mg | ORAL_TABLET | Freq: Every day | ORAL | 0 refills | Status: DC
  Filled 2023-04-25: qty 5, 5d supply, fill #0

## 2023-04-25 NOTE — Progress Notes (Unsigned)
 Pain Scale   Average Pain 4 Patient advising her pain is manageable with the muscle relaxer's. She still is having pain in lower back radiating to left leg.         +Driver, -BT, -Dye Allergies.

## 2023-04-25 NOTE — Progress Notes (Unsigned)
 Alicia Obrien - 48 y.o. female MRN [id number]  Date of birth: 1975-04-05  Office Visit Note: Visit Date: 04/25/2023 PCP: Rudene Christians, DO Referred by: Masters, Psychiatric nurse, DO  Subjective: Chief Complaint  Patient presents with   Lower Back - Follow-up   HPI: Alicia Obrien is a 48 y.o. female who comes in today for evaluation of acute on chronic left sided lower back pain radiating down posterior leg to foot. Pain started about 2 weeks ago. Onset of pain when she walked down stairs to take trash out. Her pain worsens with bending, twisting and prolonged walking. She describes her pain as sore and aching sensation, currently rates as 4 out of 10. Some relief of pain with home exercise regimen, rest and use of medications. Good relief of pain with Meloxicam and Robaxin. Feels her pain has improved since onset. Lumbar MRI imaging from 2023 shows disc desiccation and narrowing at L5-S1 with central protrusion that contacts but does not compress the descending S1 nerve roots. Left subarticular recess patency is improved at L5-S1 after regression of previously seen herniation. No high grade spinal canal stenosis. She underwent L5-S1 left sided hemilaminectomy and discectomy with Dr. Willia Craze in December of 2023. She also underwent left S1 transforaminal epidural steroid injection post surgery on 03/22/2022, significant relief of pain with this procedure, greater than 80% for 3 or more months. 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 Patient denies focal weakness, numbness and tingling. No recent trauma or falls.         Review of Systems  Musculoskeletal:  Positive for back pain.  Neurological:  Negative for tingling, sensory change, focal weakness and weakness.  All other systems reviewed and are negative.  Otherwise per HPI.  Assessment & Plan: Visit Diagnoses:    ICD-10-CM   1. Chronic left-sided low back pain with left-sided sciatica  M54.42 XR Lumbar Spine Complete    G89.29     2. Lumbar radiculopathy  M54.16 XR Lumbar Spine Complete    3. S/P lumbar microdiscectomy  Z98.890 XR Lumbar Spine Complete       Plan: Findings:  Acute on chronic left sided lower back pain radiating down posterior leg to foot. She continues to have pain despite good conservative therapies such as home exercise regimen, rest and use of medications. Patients clinical presentation and exam are consistent with lumbar myofascial strain. Her pain is slowly improving since onset with medications. Her pain is more of S1 nerve pattern. I obtained lumbar radiographs in the office today that shows disc height loss at L5-S1. No new concerning findings. No fractures or dislocations. I do think this is more of an acute exacerbation of pain. We discussed treatment plan in detail today. I prescribed short course of oral Prednisone for her to take. Should her pain persist would consider repeating left S1 transforaminal epidural steroid injection. Would also consider obtaining new lumbar MRI imaging and referring back to Dr. Christell Constant for his opinion. She has no questions at this time. I will see her back in 4 weeks for re-evaluation.     Meds & Orders:  Meds ordered this encounter  Medications   predniSONE (DELTASONE) 50 MG tablet    Sig: Take 1 tablet (50 mg total) by mouth daily with breakfast. Take until completed.    Dispense:  5 tablet    Refill:  0    Orders Placed This Encounter  Procedures   XR Lumbar Spine Complete    Follow-up: Return for  4 week follow up for re-evaluation.   Procedures: No procedures performed      Clinical History: MRI LUMBAR SPINE WITHOUT CONTRAST   TECHNIQUE: Multiplanar, multisequence MR imaging of the lumbar spine was performed. No intravenous contrast was administered.   COMPARISON:  12/14/2019   FINDINGS: Segmentation:  5 lumbar type vertebrae   Alignment:  Physiologic.   Vertebrae:  No fracture, evidence of discitis, or bone lesion.   Conus  medullaris and cauda equina: Conus extends to the L2 level. Conus and cauda equina appear normal.   Paraspinal and other soft tissues: Negative for perispinal mass or inflammation   Disc levels:   L5-S1 disc desiccation and narrowing with central protrusion that contacts but does not compress the descending S1 nerve roots. A discrete herniation that was left paracentral on prior has regressed. Facet spurring on both sides at this level with mild bilateral foraminal narrowing based on axial images. Borderline facet spurring at L4-5.   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 No progression since 2021, rather left subarticular recess patency is improved after regression of previously seen herniation.     Electronically Signed   By: Tiburcio Pea M.D.   On: 11/16/2021 19:11   She reports that she has never smoked. She has never been exposed to tobacco smoke. She has never used smokeless tobacco.  Recent Labs    05/17/22 0900 11/28/22 0906 04/12/23 1457  HGBA1C 6.4* 5.8* 5.9*    Objective:  VS:  HT:    WT:   BMI:     BP:   HR: bpm  TEMP: ( )  RESP:  Physical Exam Vitals and nursing note reviewed.  HENT:     Head: Normocephalic and atraumatic.     Right Ear: External ear normal.     Left Ear: External ear normal.     Nose: Nose normal.     Mouth/Throat:     Mouth: Mucous membranes are moist.  Eyes:     Extraocular Movements: Extraocular movements intact.  Cardiovascular:     Rate and Rhythm: Normal rate.     Pulses: Normal pulses.  Pulmonary:     Effort: Pulmonary effort is normal.  Abdominal:     General: Abdomen is flat. There is no distension.  Musculoskeletal:        General: Tenderness present.     Cervical back: Normal range of motion.     Comments: Patient rises from seated position to standing without difficulty. Good lumbar range of motion. No pain noted with facet loading. 5/5 strength noted with bilateral hip flexion, knee flexion/extension,  ankle dorsiflexion/plantarflexion and EHL. No clonus noted bilaterally. No pain upon palpation of greater trochanters. No pain with internal/external rotation of bilateral hips. Sensation intact bilaterally. Dysesthesias noted to left S1 dermatome. Negative slump test bilaterally. Ambulates without aid, gait steady.     Skin:    General: Skin is warm and dry.     Capillary Refill: Capillary refill takes less than 2 seconds.  Neurological:     General: No focal deficit present.     Mental Status: She is alert and oriented to person, place, and time.  Psychiatric:        Mood and Affect: Mood normal.        Behavior: Behavior normal.     Ortho Exam  Imaging: No results found.  Past Medical/Family/Surgical/Social History: Medications & Allergies reviewed per EMR, new medications updated. Patient Active Problem List   Diagnosis Date Noted   BRBPR (  bright red blood per rectum) 05/11/2022   Depression 02/23/2022   PTSD (post-traumatic stress disorder) 02/23/2022   Vitamin D deficiency 03/03/2021   Type 2 diabetes mellitus (HCC) 12/10/2020   Healthcare maintenance 12/10/2020   Lumbar radiculopathy 11/27/2019   Status post laparoscopic hysterectomy 09/17/2019   Genetic testing 08/12/2019   Family history of breast cancer    Obesity with serious comorbidity 03/27/2015   Essential hypertension 01/28/2015   Hyperlipidemia 01/28/2015   Past Medical History:  Diagnosis Date   Allergy    Anemia    younger   Constipation    Depression    Diabetes mellitus without complication, without long-term current use of insulin (HCC)    Elevated cholesterol    Family history of breast cancer    Family history of prostate cancer    GERD (gastroesophageal reflux disease)    Heart murmur    "begnin"   Hypertension    PTSD (post-traumatic stress disorder)    Vertigo    Family History  Problem Relation Age of Onset   Diabetes Mother    Heart attack Father    Cancer Maternal Uncle         bone cancer    Heart disease Maternal Uncle    Prostate cancer Maternal Uncle 72   Parkinson's disease Maternal Uncle    Breast cancer Paternal Aunt        dx early 41s   Other Paternal Aunt        has breast lumps, watching her closely   Prostate cancer Paternal Uncle 77       dx 2nd time in his 24s   Dementia Maternal Grandmother    Breast cancer Paternal Grandmother 50       d. in her 40s   Breast cancer Cousin 36       mother's maternal first cousin   Breast cancer Maternal Great-grandmother    Breast cancer Other        PGMs sister dx in her 63s   Colon polyps Neg Hx    Colon cancer Neg Hx    Past Surgical History:  Procedure Laterality Date   CYSTOSCOPY N/A 09/17/2019   Procedure: CYSTOSCOPY;  Surgeon: Wanita Gutta, MD;  Location: [facility];  Service: Gynecology;  Laterality: N/A;   HEMI-MICRODISCECTOMY LUMBAR LAMINECTOMY LEVEL 1 Left 12/10/2021   Procedure: L5-S1 LEFT SIDED HEMILAMINECTOMY AND DISCECTOMY;  Surgeon: Diedra Fowler, MD;  Location[facility] OR;  Service: Orthopedics;  Laterality: Left;   TOTAL LAPAROSCOPIC HYSTERECTOMY WITH SALPINGECTOMY N/A 09/17/2019   Procedure: TOTAL LAPAROSCOPIC HYSTERECTOMY, BILATERAL SALPINGECTOMY;  Surgeon: Wanita Gutta, MD;  Location: [facility];  Service: Gynecology;  Laterality: N/A;   Social History   Occupational History   Not on file  Tobacco Use   Smoking status: Never    Passive exposure: Never   Smokeless tobacco: Never  Vaping Use   Vaping status: Never Used  Substance and Sexual Activity   Alcohol use: No   Drug use: No   Sexual activity: Yes    Partners: Male    Comment: Hysterectomy, menarche 48yo, sexual debut 48yo

## 2023-05-01 ENCOUNTER — Other Ambulatory Visit (HOSPITAL_COMMUNITY): Payer: Self-pay

## 2023-05-23 ENCOUNTER — Ambulatory Visit: Admitting: Physical Medicine and Rehabilitation

## 2023-05-26 ENCOUNTER — Other Ambulatory Visit: Payer: Self-pay | Admitting: Internal Medicine

## 2023-05-26 DIAGNOSIS — E119 Type 2 diabetes mellitus without complications: Secondary | ICD-10-CM

## 2023-05-29 ENCOUNTER — Telehealth: Payer: Self-pay

## 2023-05-29 ENCOUNTER — Other Ambulatory Visit (HOSPITAL_COMMUNITY): Payer: Self-pay

## 2023-05-29 ENCOUNTER — Other Ambulatory Visit: Payer: Self-pay

## 2023-05-29 MED ORDER — OZEMPIC (2 MG/DOSE) 8 MG/3ML ~~LOC~~ SOPN
2.0000 mg | PEN_INJECTOR | SUBCUTANEOUS | 0 refills | Status: DC
  Filled 2023-05-29 (×2): qty 3, 28d supply, fill #0

## 2023-05-29 NOTE — Telephone Encounter (Signed)
 Medication sent to pharmacy

## 2023-05-29 NOTE — Telephone Encounter (Signed)
 Patient is requesting to switch to Mounjaro instead of Ozempic .

## 2023-05-30 ENCOUNTER — Other Ambulatory Visit: Payer: Self-pay | Admitting: Internal Medicine

## 2023-05-30 ENCOUNTER — Other Ambulatory Visit (HOSPITAL_COMMUNITY): Payer: Self-pay

## 2023-05-30 ENCOUNTER — Telehealth: Payer: Self-pay

## 2023-05-30 ENCOUNTER — Other Ambulatory Visit: Payer: Self-pay

## 2023-05-30 DIAGNOSIS — E119 Type 2 diabetes mellitus without complications: Secondary | ICD-10-CM

## 2023-05-30 MED ORDER — MOUNJARO 2.5 MG/0.5ML ~~LOC~~ SOAJ
2.5000 mg | SUBCUTANEOUS | 3 refills | Status: DC
  Filled 2023-05-30: qty 2, 28d supply, fill #0
  Filled 2023-06-24 – 2023-07-07 (×2): qty 2, 28d supply, fill #1

## 2023-05-30 NOTE — Telephone Encounter (Signed)
 Prior Authorization for patient (Mounjaro 2.5MG /0.5ML auto-injectors) came through on cover my meds was submitted awaiting approval or denial.  ZOX:[id number]

## 2023-05-30 NOTE — Progress Notes (Signed)
 Patient would like to try ozempic  as discussed in last office visit if she felt like ozempic  was no longer effective. P: Mounjaro 2.5 mg sent in  Discontinue ozempic 

## 2023-05-30 NOTE — Telephone Encounter (Signed)
 Alicia Obrien (Key: [id number]) Mounjaro 2.5MG /0.5ML auto-injectors Form Med[employer] ePA Form 2017 NCPDP Created Sent to Plan Plan Response Submit Clinical Questions Determination Message from Plan Member should be able to get the drug/product without a PA at this time.  Patient is aware.

## 2023-06-06 ENCOUNTER — Other Ambulatory Visit: Payer: Self-pay

## 2023-06-07 ENCOUNTER — Other Ambulatory Visit: Payer: Self-pay

## 2023-06-08 ENCOUNTER — Other Ambulatory Visit (HOSPITAL_COMMUNITY): Payer: Self-pay

## 2023-06-09 ENCOUNTER — Other Ambulatory Visit (HOSPITAL_COMMUNITY): Payer: Self-pay

## 2023-06-24 ENCOUNTER — Other Ambulatory Visit: Payer: Self-pay

## 2023-07-06 ENCOUNTER — Other Ambulatory Visit (HOSPITAL_COMMUNITY): Payer: Self-pay

## 2023-07-07 ENCOUNTER — Other Ambulatory Visit (HOSPITAL_COMMUNITY): Payer: Self-pay

## 2023-07-18 ENCOUNTER — Telehealth: Payer: Self-pay

## 2023-07-18 ENCOUNTER — Other Ambulatory Visit (HOSPITAL_COMMUNITY): Payer: Self-pay

## 2023-07-18 ENCOUNTER — Other Ambulatory Visit: Payer: Self-pay | Admitting: Student

## 2023-07-18 DIAGNOSIS — E119 Type 2 diabetes mellitus without complications: Secondary | ICD-10-CM

## 2023-07-18 MED ORDER — MOUNJARO 5 MG/0.5ML ~~LOC~~ SOAJ
5.0000 mg | SUBCUTANEOUS | 3 refills | Status: DC
  Filled 2023-07-18 – 2023-09-01 (×6): qty 2, 28d supply, fill #0
  Filled 2023-09-01 – 2023-09-29 (×2): qty 2, 28d supply, fill #1

## 2023-07-18 NOTE — Telephone Encounter (Signed)
 ERROR

## 2023-07-20 ENCOUNTER — Other Ambulatory Visit: Payer: Self-pay

## 2023-07-24 ENCOUNTER — Other Ambulatory Visit: Payer: Self-pay

## 2023-07-24 ENCOUNTER — Other Ambulatory Visit (HOSPITAL_COMMUNITY): Payer: Self-pay

## 2023-07-25 ENCOUNTER — Other Ambulatory Visit: Payer: Self-pay

## 2023-07-26 ENCOUNTER — Other Ambulatory Visit: Payer: Self-pay

## 2023-07-26 ENCOUNTER — Other Ambulatory Visit (HOSPITAL_COMMUNITY): Payer: Self-pay

## 2023-07-26 ENCOUNTER — Ambulatory Visit

## 2023-07-26 VITALS — BP 115/74 | HR 75 | Temp 98.7°F | Ht 68.5 in | Wt 213.4 lb

## 2023-07-26 DIAGNOSIS — J069 Acute upper respiratory infection, unspecified: Secondary | ICD-10-CM

## 2023-07-26 MED ORDER — IBUPROFEN 200 MG PO TABS
400.0000 mg | ORAL_TABLET | ORAL | 1 refills | Status: DC | PRN
  Filled 2023-07-26: qty 100, 9d supply, fill #0

## 2023-07-26 MED ORDER — ACETAMINOPHEN 325 MG PO TABS
325.0000 mg | ORAL_TABLET | ORAL | 0 refills | Status: DC | PRN
  Filled 2023-07-26: qty 100, 17d supply, fill #0

## 2023-07-26 MED ORDER — CHLORPHENIRAMINE-ACETAMINOPHEN 2-325 MG PO TABS: 1.0000 | ORAL_TABLET | Freq: Four times a day (QID) | ORAL | Status: AC

## 2023-07-26 NOTE — Progress Notes (Signed)
 CC: throat irritation HPI:  [name] is a 48 y.o. female living with a history stated below and presents today for rhinorrhea and throat irritation. Please see problem based assessment and plan for additional details.  Past Medical History:  Diagnosis Date   Allergy    Anemia    younger   Constipation    Depression    Diabetes mellitus without complication, without long-term current use of insulin  (HCC)    Elevated cholesterol    Family history of breast cancer    Family history of prostate cancer    GERD (gastroesophageal reflux disease)    Heart murmur    begnin   Hypertension    PTSD (post-traumatic stress disorder)    Vertigo     Current Outpatient Medications on File Prior to Visit  Medication Sig Dispense Refill   cetirizine (ZYRTEC) 10 MG tablet Take 20 mg by mouth daily.     hydrochlorothiazide  (HYDRODIURIL ) 12.5 MG tablet Take 1 tablet (12.5 mg total) by mouth daily. 90 tablet 1   losartan  (COZAAR ) 50 MG tablet Take 1 tablet by mouth daily 90 tablet 2   meloxicam  (MOBIC ) 15 MG tablet Take 1 tablet (15 mg total) by mouth daily. 30 tablet 0   methocarbamol  (ROBAXIN ) 500 MG tablet Take 1 tablet (500 mg total) by mouth 3 (three) times daily. 90 tablet 0   Oxymetazoline HCl (VICKS SINEX 12 HOUR NA) Place 1 spray into the nose daily as needed (congestion). (Patient not taking: Reported on 08/11/2022)     predniSONE  (DELTASONE ) 50 MG tablet Take 1 tablet (50 mg total) by mouth daily with breakfast. Take until completed. 5 tablet 0   rosuvastatin  (CRESTOR ) 20 MG tablet Take 1 tablet (20 mg total) by mouth daily. 90 tablet 3   tirzepatide  (MOUNJARO ) 5 MG/0.5ML Pen Inject 5 mg into the skin once a week. 2 mL 3   No current facility-administered medications on file prior to visit.    Family History  Problem Relation Age of Onset   Diabetes Mother    Heart attack Father    Cancer Maternal Uncle        bone cancer    Heart disease Maternal Uncle     Prostate cancer Maternal Uncle 59   Parkinson's disease Maternal Uncle    Breast cancer Paternal Aunt        dx early 52s   Other Paternal Aunt        has breast lumps, watching her closely   Prostate cancer Paternal Uncle 38       dx 2nd time in his 39s   Dementia Maternal Grandmother    Breast cancer Paternal Grandmother 50       d. in her 33s   Breast cancer Cousin 72       mother's maternal first cousin   Breast cancer Maternal Great-grandmother    Breast cancer Other        PGMs sister dx in her 46s   Colon polyps Neg Hx    Colon cancer Neg Hx     Social History   Socioeconomic History   Marital status: Single    Spouse name: Not on file   Number of children: 3   Years of education: 16   Highest education level: Not on file  Occupational History   Not on file  Tobacco Use   Smoking status: Never    Passive exposure: Never   Smokeless tobacco: Never  Vaping Use  Vaping status: Never Used  Substance and Sexual Activity   Alcohol  use: No   Drug use: No   Sexual activity: Yes    Partners: Male    Comment: Hysterectomy, menarche 48yo, sexual debut 48yo  Other Topics Concern   Not on file  Social History Narrative   Not on file   Social Drivers of Health   Financial Resource Strain: Low Risk  (02/23/2022)   Overall Financial Resource Strain (CARDIA)    Difficulty of Paying Living Expenses: Not hard at all  Food Insecurity: No Food Insecurity (02/23/2022)   Hunger Vital Sign    Worried About Running Out of Food in the Last Year: Never true    Ran Out of Food in the Last Year: Never true  Transportation Needs: No Transportation Needs (02/23/2022)   PRAPARE - Administrator, Civil Service (Medical): No    Lack of Transportation (Non-Medical): No  Physical Activity: Sufficiently Active (02/23/2022)   Exercise Vital Sign    Days of Exercise per Week: 5 days    Minutes of Exercise per Session: 40 min  Stress: No Stress Concern Present (02/23/2022)    [employer] of Occupational Health - Occupational Stress Questionnaire    Feeling of Stress : Not at all  Social Connections: Moderately Integrated (07/26/2023)   Social Connection and Isolation Panel    Frequency of Communication with Friends and Family: More than three times a week    Frequency of Social Gatherings with Friends and Family: More than three times a week    Attends Religious Services: More than 4 times per year    Active Member of [employer] or Organizations: Yes    Attends Banker Meetings: More than 4 times per year    Marital Status: Never married  Intimate Partner Violence: Not At Risk (02/23/2022)   Humiliation, Afraid, Rape, and Kick questionnaire    Fear of Current or Ex-Partner: No    Emotionally Abused: No    Physically Abused: No    Sexually Abused: No    Review of Systems: ROS negative except for what is noted on the assessment and plan.  Vitals:   07/26/23 1520  BP: 115/74  Pulse: 75  Temp: 98.7 F (37.1 C)  TempSrc: Oral  SpO2: 98%  Weight: 213 lb 6.4 oz (96.8 kg)  Height: 5' 8.5 (1.74 m)    Physical Exam  Physical Exam: Constitutional: well-appearing in no acute distress with mask  Eyes: conjunctiva non-erythematous Cardiovascular: regular rate and rhythm, no m/r/g Pulmonary/Chest: normal work of breathing on room air, lungs clear to auscultation bilaterally Neurological: alert & oriented x 3, 5/5 strength in bilateral upper and lower extremities, normal gait Skin: warm and dry   Assessment & Plan:   Viral URI Patient presents with throat irritation, rhinorrhea, nasal congestion. Denies cough, fever, or malaise. Has not tried any medications yet. Throat: Mild erythema of the oropharynx without exudate. Ears: Tympanic membranes intact, clear, and without signs of effusion or infection, normal light reflex   Assessment:  Viral upper respiratory infection (URI).  Plan: Start Coricidin 4 times daily for symptom  relief Start Tylenol  as needed for discomfort or fever Recommend rest and hydration Patient has a COVID-19 test at home and will self-test accordingly Follow up as needed if symptoms worsen or persist   Patient seen with Dr. Karna Armando Rossetti M.D [facility] Internal Medicine, PGY-1 Phone: [phone] Date 07/26/2023 Time 4:26 PM

## 2023-07-26 NOTE — Assessment & Plan Note (Addendum)
 Patient presents with throat irritation, rhinorrhea, nasal congestion. Denies cough, fever, or malaise. Has not tried any medications yet. Throat: Mild erythema of the oropharynx without exudate. Ears: Tympanic membranes intact, clear, and without signs of effusion or infection, normal light reflex   Assessment:  Viral upper respiratory infection (URI).  Plan: Start Coricidin 4 times daily for symptom relief Start Tylenol  as needed for discomfort or fever Recommend rest and hydration Patient has a COVID-19 test at home and will self-test accordingly Follow up as needed if symptoms worsen or persist

## 2023-07-31 ENCOUNTER — Other Ambulatory Visit: Payer: Self-pay

## 2023-07-31 NOTE — Progress Notes (Signed)
 Internal Medicine Clinic Attending  I was physically present during the key portions of the resident provided service and participated in the medical decision making of patient's management care. I reviewed pertinent patient test results.  The assessment, diagnosis, and plan were formulated together and I agree with the documentation in the resident's note.  Dickie La, MD

## 2023-08-01 ENCOUNTER — Other Ambulatory Visit: Payer: Self-pay

## 2023-08-14 ENCOUNTER — Other Ambulatory Visit: Payer: Self-pay | Admitting: Radiology

## 2023-08-14 DIAGNOSIS — Z1231 Encounter for screening mammogram for malignant neoplasm of breast: Secondary | ICD-10-CM

## 2023-08-15 ENCOUNTER — Other Ambulatory Visit (HOSPITAL_COMMUNITY): Payer: Self-pay

## 2023-08-15 ENCOUNTER — Encounter: Payer: Self-pay | Admitting: Radiology

## 2023-08-15 ENCOUNTER — Ambulatory Visit (INDEPENDENT_AMBULATORY_CARE_PROVIDER_SITE_OTHER): Payer: 59 | Admitting: Radiology

## 2023-08-15 VITALS — BP 104/76 | HR 89 | Ht 69.0 in | Wt 210.0 lb

## 2023-08-15 DIAGNOSIS — Z9189 Other specified personal risk factors, not elsewhere classified: Secondary | ICD-10-CM | POA: Diagnosis not present

## 2023-08-15 DIAGNOSIS — Z01419 Encounter for gynecological examination (general) (routine) without abnormal findings: Secondary | ICD-10-CM | POA: Diagnosis not present

## 2023-08-15 DIAGNOSIS — N951 Menopausal and female climacteric states: Secondary | ICD-10-CM | POA: Diagnosis not present

## 2023-08-15 DIAGNOSIS — Z1331 Encounter for screening for depression: Secondary | ICD-10-CM | POA: Diagnosis not present

## 2023-08-15 MED ORDER — ESTRADIOL 0.05 MG/24HR TD PTTW
1.0000 | MEDICATED_PATCH | TRANSDERMAL | 4 refills | Status: DC
  Filled 2023-08-15 – 2023-08-16 (×2): qty 24, 84d supply, fill #0
  Filled 2023-08-16: qty 8, 28d supply, fill #0

## 2023-08-15 NOTE — Progress Notes (Signed)
   Bobbie Virden 1975-07-29 [id number]   History:  48 y.o. G0 presents for annual exam. C/o hot flashes, night sweats, trouble sleeping, mood swings, joint pain, brain fog. Up to date on screenings. Works as a CMA Internal med. Elevated risk of breast cancer 22.4%. Breast MRI 12/24 negative. Due for screening mammogram next month.  Gynecologic History Hysterectomy: TLH/BS 2021  Sexually active: yes  Health Maintenance Last Pap: 2021. Results were: normal Last mammogram: 9/24. Results were: normal Breast MRI: 12/24 normal Last colonoscopy: 2023.      08/15/2023    2:18 PM 07/26/2023    3:23 PM 04/12/2023    3:03 PM 11/28/2022    9:04 AM 05/17/2022    8:58 AM  Depression screen PHQ 2/9  Decreased Interest 0 0 0 0 0  Down, Depressed, Hopeless 0 0 0 0 0  PHQ - 2 Score 0 0 0 0 0  Altered sleeping   3  0  Tired, decreased energy   0  0  Change in appetite   1  0  Feeling bad or failure about yourself    0  0  Trouble concentrating   0  0  Moving slowly or fidgety/restless   0  0  Suicidal thoughts   0  0  PHQ-9 Score   4  0  Difficult doing work/chores   Somewhat difficult  Not difficult at all    Past medical history, past surgical history, family history and social history were all reviewed and documented in the EPIC chart.  ROS:  A ROS was performed and pertinent positives and negatives are included.  Exam:  Vitals:   08/15/23 1416  BP: 104/76  Pulse: 89  SpO2: 95%  Weight: 210 lb (95.3 kg)  Height: 5' 9 (1.753 m)    Body mass index is 31.01 kg/m.  General appearance:  Normal Thyroid :  Symmetrical, normal in size, without palpable masses or nodularity. Respiratory  Auscultation:  Clear without wheezing or rhonchi Cardiovascular  Auscultation:  Regular rate, without rubs, murmurs or gallops  Edema/varicosities:  Not grossly evident Abdominal  Soft,nontender, without masses, guarding or rebound.  Liver/spleen:  No organomegaly noted  Hernia:  None  appreciated  Skin  Inspection:  Grossly normal Breasts: Examined lying and sitting.   Right: Without masses, retractions, nipple discharge or axillary adenopathy.   Left: Without masses, retractions, nipple discharge or axillary adenopathy. Genitourinary   Inguinal/mons:  Normal without inguinal adenopathy  External genitalia:  Normal appearing vulva with no masses, tenderness, or lesions  BUS/Urethra/Skene's glands:  Normal  Vagina:  Normal appearing with normal color and discharge, no lesions.   Cervix:  absent  Uterus:  absent  Adnexa/parametria:     Rt: Normal in size, without masses or tenderness.   Lt: Normal in size, without masses or tenderness.  Anus and perineum: Normal   Darice Hoit, CMA present for exam  Assessment/Plan:   1. Well woman exam with routine gynecological exam (Primary) Pap 2026  2. Menopausal symptoms Risks and benefits reviewed - estradiol  (VIVELLE -DOT) 0.05 MG/24HR patch; Place 1 patch (0.05 mg total) onto the skin 2 (two) times a week.  Dispense: 24 patch; Refill: 4  3. Increased risk of breast cancer Mammo 5/25 MRI 12/25  4. Depression screen Negative today      Raef Sprigg B WHNP-BC 2:35 PM 08/15/2023

## 2023-08-16 ENCOUNTER — Other Ambulatory Visit: Payer: Self-pay

## 2023-08-16 ENCOUNTER — Other Ambulatory Visit (HOSPITAL_COMMUNITY): Payer: Self-pay

## 2023-08-16 NOTE — Telephone Encounter (Signed)
 That should be covered by insurance and under $45 for a cash pay 3 month supply with good rx

## 2023-08-16 NOTE — Telephone Encounter (Signed)
 Jami -please advise if you have a recommended alternative to vivelle -dot or do you want patient to contact her plan to determine covered alternative?

## 2023-08-17 ENCOUNTER — Other Ambulatory Visit: Payer: Self-pay | Admitting: Radiology

## 2023-08-17 ENCOUNTER — Other Ambulatory Visit (HOSPITAL_COMMUNITY): Payer: Self-pay

## 2023-08-17 DIAGNOSIS — N951 Menopausal and female climacteric states: Secondary | ICD-10-CM

## 2023-08-17 MED ORDER — ESTRADIOL 0.05 MG/24HR TD PTTW: 1.0000 | MEDICATED_PATCH | TRANSDERMAL | 4 refills | Status: DC

## 2023-08-22 ENCOUNTER — Other Ambulatory Visit: Payer: Self-pay | Admitting: Radiology

## 2023-08-22 DIAGNOSIS — N951 Menopausal and female climacteric states: Secondary | ICD-10-CM

## 2023-08-22 MED ORDER — ESTRADIOL 0.05 MG/24HR TD PTTW: 1.0000 | MEDICATED_PATCH | TRANSDERMAL | 12 refills | Status: DC

## 2023-08-22 NOTE — Telephone Encounter (Signed)
 Patient requested Estradiol 0.05 mg patch to be sent to [facility].  She has a GoodRX coupon and it will be less expensive at [facility]. Sent to provider for review.

## 2023-08-22 NOTE — Telephone Encounter (Signed)
 Pharmacy requests RX change:  for estradiol  0.05 mg patch.  Pharmacy comment: Product Backordered/Unavailable.   LM for patient (ok per DPR), advised patient her estradiol  0.05 mg patch is backordered at [facility]  Asked patient to call back with another pharmacy she would like to use.

## 2023-09-01 ENCOUNTER — Other Ambulatory Visit: Payer: Self-pay

## 2023-09-01 ENCOUNTER — Other Ambulatory Visit (HOSPITAL_COMMUNITY): Payer: Self-pay

## 2023-09-04 ENCOUNTER — Other Ambulatory Visit: Payer: Self-pay

## 2023-09-04 ENCOUNTER — Other Ambulatory Visit (HOSPITAL_COMMUNITY): Payer: Self-pay

## 2023-09-04 DIAGNOSIS — I10 Essential (primary) hypertension: Secondary | ICD-10-CM

## 2023-09-04 MED ORDER — HYDROCHLOROTHIAZIDE 12.5 MG PO TABS
12.5000 mg | ORAL_TABLET | Freq: Every day | ORAL | 1 refills | Status: AC
Start: 2023-09-04 — End: ?
  Filled 2023-09-04 (×2): qty 90, 90d supply, fill #0
  Filled 2023-12-04: qty 90, 90d supply, fill #1
  Filled 2023-12-06: qty 90, 90d supply, fill #0

## 2023-09-14 ENCOUNTER — Other Ambulatory Visit: Payer: Self-pay

## 2023-09-14 ENCOUNTER — Other Ambulatory Visit (HOSPITAL_COMMUNITY): Payer: Self-pay

## 2023-09-14 DIAGNOSIS — I10 Essential (primary) hypertension: Secondary | ICD-10-CM

## 2023-09-14 DIAGNOSIS — E1169 Type 2 diabetes mellitus with other specified complication: Secondary | ICD-10-CM

## 2023-09-14 MED ORDER — ROSUVASTATIN CALCIUM 20 MG PO TABS
20.0000 mg | ORAL_TABLET | Freq: Every day | ORAL | 3 refills | Status: AC
  Filled 2023-09-14: qty 90, 90d supply, fill #0
  Filled 2023-12-09: qty 90, 90d supply, fill #1

## 2023-09-14 MED ORDER — LOSARTAN POTASSIUM 50 MG PO TABS
50.0000 mg | ORAL_TABLET | Freq: Every day | ORAL | 2 refills | Status: DC
  Filled 2023-09-14: qty 90, 90d supply, fill #0

## 2023-09-14 MED ORDER — ROSUVASTATIN CALCIUM 20 MG PO TABS
20.0000 mg | ORAL_TABLET | Freq: Every day | ORAL | 3 refills | Status: DC
  Filled 2023-09-14: qty 90, 90d supply, fill #0

## 2023-09-14 MED ORDER — LOSARTAN POTASSIUM 50 MG PO TABS
50.0000 mg | ORAL_TABLET | Freq: Every day | ORAL | 2 refills | Status: AC
  Filled 2023-09-14: qty 90, 90d supply, fill #0
  Filled 2023-12-09: qty 90, 90d supply, fill #1

## 2023-09-14 NOTE — Telephone Encounter (Signed)
 Medication sent to pharmacy

## 2023-09-14 NOTE — Addendum Note (Signed)
 Addended by: Shanautica Forker on: 09/14/2023 02:38 PM   Modules accepted: Orders

## 2023-09-15 ENCOUNTER — Other Ambulatory Visit: Payer: Self-pay

## 2023-09-22 ENCOUNTER — Other Ambulatory Visit: Payer: Self-pay

## 2023-09-22 ENCOUNTER — Other Ambulatory Visit: Payer: Self-pay | Admitting: Physical Medicine and Rehabilitation

## 2023-09-22 ENCOUNTER — Other Ambulatory Visit (HOSPITAL_COMMUNITY): Payer: Self-pay

## 2023-09-22 ENCOUNTER — Encounter (HOSPITAL_COMMUNITY): Payer: Self-pay

## 2023-09-29 ENCOUNTER — Other Ambulatory Visit: Payer: Self-pay

## 2023-09-29 ENCOUNTER — Ambulatory Visit
Admission: RE | Admit: 2023-09-29 | Discharge: 2023-09-29 | Disposition: A | Discharge status: Home / Self Care | Source: Ambulatory Visit | Attending: Radiology | Admitting: Radiology

## 2023-09-29 ENCOUNTER — Ambulatory Visit

## 2023-09-29 DIAGNOSIS — Z1231 Encounter for screening mammogram for malignant neoplasm of breast: Secondary | ICD-10-CM | POA: Diagnosis not present

## 2023-10-04 ENCOUNTER — Other Ambulatory Visit: Payer: Self-pay

## 2023-10-10 ENCOUNTER — Other Ambulatory Visit: Payer: Self-pay

## 2023-10-19 ENCOUNTER — Other Ambulatory Visit: Payer: Self-pay | Admitting: Medical Genetics

## 2023-10-25 ENCOUNTER — Telehealth: Payer: Self-pay

## 2023-10-25 DIAGNOSIS — E119 Type 2 diabetes mellitus without complications: Secondary | ICD-10-CM

## 2023-10-25 NOTE — Telephone Encounter (Signed)
 Patient is requesting a new rx for Mounjaro . Patient is request the next dose. Please send a new rx.  [facility]

## 2023-10-26 ENCOUNTER — Other Ambulatory Visit: Payer: Self-pay

## 2023-10-26 MED ORDER — MOUNJARO 7.5 MG/0.5ML ~~LOC~~ SOAJ
7.5000 mg | SUBCUTANEOUS | 1 refills | Status: DC
  Filled 2023-10-26: qty 2, 28d supply, fill #0
  Filled 2023-11-23: qty 2, 28d supply, fill #1

## 2023-10-26 NOTE — Addendum Note (Signed)
 Addended by: Orpha Dain on: 10/26/2023 04:57 PM   Modules accepted: Orders

## 2023-10-28 ENCOUNTER — Other Ambulatory Visit: Payer: Self-pay | Attending: Medical Genetics

## 2023-10-30 ENCOUNTER — Other Ambulatory Visit: Payer: Self-pay

## 2023-10-30 ENCOUNTER — Ambulatory Visit

## 2023-10-30 VITALS — BP 125/73 | HR 79 | Temp 98.1°F | Ht 69.0 in | Wt 210.4 lb

## 2023-10-30 DIAGNOSIS — G4700 Insomnia, unspecified: Secondary | ICD-10-CM | POA: Diagnosis not present

## 2023-10-30 DIAGNOSIS — E785 Hyperlipidemia, unspecified: Secondary | ICD-10-CM

## 2023-10-30 DIAGNOSIS — E1169 Type 2 diabetes mellitus with other specified complication: Secondary | ICD-10-CM

## 2023-10-30 DIAGNOSIS — Z79899 Other long term (current) drug therapy: Secondary | ICD-10-CM

## 2023-10-30 DIAGNOSIS — I10 Essential (primary) hypertension: Secondary | ICD-10-CM

## 2023-10-30 DIAGNOSIS — E119 Type 2 diabetes mellitus without complications: Secondary | ICD-10-CM | POA: Diagnosis not present

## 2023-10-30 DIAGNOSIS — Z7985 Long-term (current) use of injectable non-insulin antidiabetic drugs: Secondary | ICD-10-CM | POA: Diagnosis not present

## 2023-10-30 LAB — GLUCOSE, CAPILLARY: Glucose-Capillary: 102 mg/dL — ABNORMAL HIGH (ref 70–99)

## 2023-10-30 LAB — POCT GLYCOSYLATED HEMOGLOBIN (HGB A1C): HbA1c, POC (controlled diabetic range): 5.8 % (ref 0.0–7.0)

## 2023-10-30 NOTE — Patient Instructions (Addendum)
 Thank you, [name] for allowing us  to provide your care today. Today we discussed the following:  -For your insomnia, please try Benadryl over-the-counter.  I have also placed a referral for psychiatry for insomnia therapy.  If you are unable to fall asleep within 1-2 hours, please do not lay in bed all night.  Make sure to avoid bluelight (phone, TV screens) before trying to go to sleep. -For your diabetes, start taking Mounjaro  7.5 mg.  We can reassess this in the next few weeks/months.  We are also checking your renal function and screening for protein in your urine. -For your lipids, we are rechecking lipid panel.  Please continue to take your Crestor  every day.  I have ordered the following labs for you:  Lab Orders         Microalbumin / creatinine urine ratio         Lipid Profile         Glucose, capillary         Basic metabolic panel with GFR         POC Hbg A1C       Referrals ordered today:   Referral Orders         Ambulatory referral to Psychiatry       I have ordered the following medication/changed the following medications:   Stop the following medications: There are no discontinued medications.   Start the following medications: No orders of the defined types were placed in this encounter.    Follow up: 6 months or as needed for insomnia follow-up   Remember:   Should you have any questions or concerns please call the Internal Medicine Clinic at [phone].     Alicia Bullard, MD [facility]

## 2023-10-30 NOTE — Assessment & Plan Note (Addendum)
 Previously well-controlled with Mounjaro  5 mg and lifestyle changes.  A1c recheck today is 5.8.  - Increase Mounjaro  to 7.5 mg - Checking UACR + BMP for GFR - Recheck A1c in 6 months

## 2023-10-30 NOTE — Assessment & Plan Note (Signed)
 Patient states that she was previously taking Ambien  9 years ago when she was living in [place] .  Stopped taking Ambien  after resolution of symptoms with therapy.  Patient states that her insomnia has been worsening over the past few months but does not note any recent stressors.  She has tried sleep hygiene with white sound machine and earplugs that have helped her in the past, however this is not been helping recently.  Mentions racing thoughts but denies any feelings of anxiety or fatigue.  She has also tried melatonin which gives her headaches which she does not want to continue taking.  Discussed with patient that Ambien  is a short-term therapy which will not likely be good for her in the long-term.  Also explained that first-line management of insomnia is CBT plus SSRI for comorbid conditions (PTSD, depression), however the patient does not want to take any antidepressant therapy.  She is okay with antihistamine (Benadryl) and referral to psychiatry for CBT. - Referral to psychiatry for CBT - Trial Benadryl 25 mg as needed

## 2023-10-30 NOTE — Progress Notes (Cosign Needed Addendum)
 Patient name: Alicia Obrien Date of birth: May 10, 1975 Date of visit: 10/30/23  Type of visit: Established Patient Office Visit  Subjective   Chief concern: Insomnia  Alicia Obrien is a 48 y.o. female with a PMHx of T2DM, HTN, HLD, vitamin D  deficiency who presents to [facility] clinic for follow up of chronic conditions.  She would also like to address worsening acute on chronic insomnia.   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 10/30/2023   Viral URI 07/26/2023   BRBPR (bright red blood per rectum) 05/11/2022   Depression 02/23/2022   PTSD (post-traumatic stress disorder) 02/23/2022   Vitamin D  deficiency 03/03/2021   Type 2 diabetes mellitus (HCC) 12/10/2020   Healthcare maintenance 12/10/2020   Lumbar radiculopathy 11/27/2019   Status post laparoscopic hysterectomy 09/17/2019   Genetic testing 08/12/2019   Family history of breast cancer    Obesity with serious comorbidity 03/27/2015   Essential hypertension 01/28/2015   Hyperlipidemia associated with type 2 diabetes mellitus (HCC) 01/28/2015     Past Surgical History:  Procedure Laterality Date   CYSTOSCOPY N/A 09/17/2019   Procedure: CYSTOSCOPY;  Surgeon: Jannis Kate Norris, MD;  Location: [facility];  Service: Gynecology;  Laterality: N/A;   HEMI-MICRODISCECTOMY LUMBAR LAMINECTOMY LEVEL 1 Left 12/10/2021   Procedure: L5-S1 LEFT SIDED HEMILAMINECTOMY AND DISCECTOMY;  Surgeon: Georgina Ozell LABOR, MD;  Location: [facility];  Service: Orthopedics;  Laterality: Left;   TOTAL LAPAROSCOPIC HYSTERECTOMY WITH SALPINGECTOMY N/A 09/17/2019   Procedure: TOTAL LAPAROSCOPIC HYSTERECTOMY, BILATERAL SALPINGECTOMY;  Surgeon: Jannis Kate Norris, MD;  Location: [facility];  Service: Gynecology;  Laterality: N/A;    Review of Systems  Constitutional:  Positive for weight loss. Negative for chills and fever.  Respiratory:   Negative for cough, shortness of breath and wheezing.   Cardiovascular:  Negative for chest pain and leg swelling.  Gastrointestinal:  Negative for abdominal pain, constipation, diarrhea and melena.  Genitourinary:  Negative for dysuria.  Neurological:  Positive for headaches. Negative for dizziness, tremors, sensory change, speech change, focal weakness, seizures and weakness.  Psychiatric/Behavioral:  Negative for depression. The patient has insomnia.     Current Outpatient Medications  Medication Instructions   acetaminophen  (TYLENOL ) 325 mg, Oral, Every 4 hours PRN   cetirizine (ZYRTEC) 20 mg, Daily   estradiol  (VIVELLE -DOT) 0.05 mg, Transdermal, 2 times weekly   hydrochlorothiazide  (HYDRODIURIL ) 12.5 mg, Oral, Daily   ibuprofen  (ADVIL ) 400 mg, Oral, Every 4 hours PRN   losartan  (COZAAR ) 50 MG tablet Take 1 tablet by mouth daily   meloxicam  (MOBIC ) 15 mg, Oral, Daily   methocarbamol  (ROBAXIN ) 500 mg, Oral, 3 times daily   Mounjaro  7.5 mg, Subcutaneous, Weekly   Oxymetazoline HCl (VICKS SINEX 12 HOUR NA) 1 spray, Daily PRN   predniSONE  (DELTASONE ) 50 mg, Oral, Daily with breakfast, Take until completed.   rosuvastatin  (CRESTOR ) 20 mg, Oral, Daily    Social History   Tobacco Use   Smoking status: Never    Passive exposure: Never   Smokeless tobacco: Never  Vaping Use   Vaping status: Never Used  Substance Use Topics   Alcohol  use: No   Drug use: No      Objective  Today's Vitals   10/30/23 0906  BP: 125/73  Pulse: 79  Temp: 98.1 F (36.7 C)  TempSrc: Oral  SpO2: 93%  Weight: 210 lb 6.4 oz (95.4 kg)  Height: 5' 9 (1.753 m)  Body mass index is 31.07 kg/m.   Physical Exam: Constitutional: well-appearing, well-nourished; overweight; no acute distress HENT: normocephalic atraumatic, mucous membranes moist Eyes: conjunctiva non-erythematous Cardiovascular: regular rate and rhythm, no m/r/g Pulmonary/Chest: normal work of breathing on room air, lungs  CTAB Abdominal: soft, non-tender, non-distended MSK: normal bulk and tone Neurological: alert & oriented x 3, no focal deficit Skin: warm and dry Extremities: BLE without edema or erythema. Psych: normal mood and behavior  Last CBC Lab Results  Component Value Date   WBC 7.0 05/11/2022   HGB 12.2 05/11/2022   HCT 36.9 05/11/2022   MCV 84.2 05/11/2022   MCH 27.9 05/11/2022   RDW 13.6 05/11/2022   PLT 179 05/11/2022   Last metabolic panel Lab Results  Component Value Date   GLUCOSE 92 11/28/2022   NA 142 11/28/2022   K 4.2 11/28/2022   CL 103 11/28/2022   CO2 20 11/28/2022   BUN 14 11/28/2022   CREATININE 0.68 11/28/2022   EGFR 108 11/28/2022   CALCIUM  9.4 11/28/2022   PROT 7.1 09/13/2019   ALBUMIN 3.9 09/13/2019   BILITOT 0.8 09/13/2019   ALKPHOS 57 09/13/2019   AST 18 09/13/2019   ALT 21 09/13/2019   ANIONGAP 12 12/10/2021   Last lipids Lab Results  Component Value Date   CHOL 180 11/28/2022   HDL 57 11/28/2022   LDLCALC 91 11/28/2022   TRIG 187 (H) 11/28/2022   CHOLHDL 3.2 11/28/2022   Last hemoglobin A1c Lab Results  Component Value Date   HGBA1C 5.8 10/30/2023   Last vitamin D  Lab Results  Component Value Date   VD25OH 31.9 03/03/2021        Assessment & Plan  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  Stopped taking Ambien  after resolution of symptoms with therapy.  Patient states that her insomnia has been worsening over the past few months but does not note any recent stressors.  She has tried sleep hygiene with white sound machine and earplugs that have helped her in the past, however this is not been helping recently.  Mentions racing thoughts but denies any feelings of anxiety or fatigue.  She has also tried melatonin which gives her headaches which she does not want to continue taking.  Discussed with patient  that Ambien  is a short-term therapy which will not likely be good for her in the long-term.  Also explained that first-line management of insomnia is CBT plus SSRI for comorbid conditions (PTSD, depression), however the patient does not want to take any antidepressant therapy.  She is okay with antihistamine (Benadryl) and referral to psychiatry for CBT. - Referral to psychiatry for CBT - Trial Benadryl 25 mg as needed  Orders: -     Ambulatory referral to Psychiatry  Type 2 diabetes mellitus without complication, without long-term current use of insulin  (HCC) Assessment & Plan: Previously well-controlled with Mounjaro  5 mg and lifestyle changes.  A1c recheck today is 5.8.  - Increase Mounjaro  to 7.5 mg - Checking UACR + BMP for GFR - Recheck A1c in 6 months  Orders: -     POCT glycosylated hemoglobin (Hb A1C) -     Microalbumin / creatinine urine ratio -     Glucose, capillary -     Basic metabolic panel with GFR  Hyperlipidemia associated with type 2 diabetes mellitus (HCC) Assessment & Plan: Reports that she is  adherent to medication.  Currently taking Crestor  20 mg daily.  Last lipid panel at goal.  Okay to recheck today. - Recheck lipid panel today - Continue Crestor  20 mg daily  Orders: -     Lipid panel  Essential hypertension Assessment & Plan: BP well-controlled at visit today.  No new changes to medications.  On losartan  50 mg daily with HCTZ 12.5 mg daily.    Return in about 6 months (around 04/29/2024) for chronic condition follow up.  Patient case discussed with Dr. Trudy.  Allyn Bartelson, MD [facility]  PGY-1 10/30/2023, 10:06 AM

## 2023-10-30 NOTE — Assessment & Plan Note (Signed)
 BP well-controlled at visit today.  No new changes to medications.  On losartan  50 mg daily with HCTZ 12.5 mg daily.

## 2023-10-30 NOTE — Assessment & Plan Note (Signed)
 Reports that she is adherent to medication.  Currently taking Crestor  20 mg daily.  Last lipid panel at goal.  Okay to recheck today. - Recheck lipid panel today - Continue Crestor  20 mg daily

## 2023-10-31 LAB — MICROALBUMIN / CREATININE URINE RATIO
Creatinine, Urine: 362.2 mg/dL
Microalb/Creat Ratio: 11 mg/g{creat} (ref 0–29)
Microalbumin, Urine: 39 ug/mL

## 2023-10-31 LAB — BASIC METABOLIC PANEL WITH GFR
BUN/Creatinine Ratio: 15 (ref 9–23)
BUN: 11 mg/dL (ref 6–24)
CO2: 21 mmol/L (ref 20–29)
Calcium: 9.5 mg/dL (ref 8.7–10.2)
Chloride: 100 mmol/L (ref 96–106)
Creatinine, Ser: 0.74 mg/dL (ref 0.57–1.00)
Glucose: 89 mg/dL (ref 70–99)
Potassium: 3.7 mmol/L (ref 3.5–5.2)
Sodium: 141 mmol/L (ref 134–144)
eGFR: 100 mL/min/1.73 (ref >59)

## 2023-10-31 LAB — LIPID PANEL
Chol/HDL Ratio: 2.9 ratio (ref 0.0–4.4)
Cholesterol, Total: 145 mg/dL (ref 100–199)
HDL: 50 mg/dL (ref >39)
LDL Chol Calc (NIH): 71 mg/dL (ref 0–99)
Triglycerides: 141 mg/dL (ref 0–149)
VLDL Cholesterol Cal: 24 mg/dL (ref 5–40)

## 2023-10-31 NOTE — Progress Notes (Signed)
 Internal Medicine Clinic Attending  Case discussed with the resident at the time of the visit.  We reviewed the resident's history and exam and pertinent patient test results.  I agree with the assessment, diagnosis, and plan of care documented in the resident's note.  OTC diphenhydramine is safe and short acting and acceptable for prn use.

## 2023-11-01 ENCOUNTER — Other Ambulatory Visit: Payer: Self-pay

## 2023-11-01 ENCOUNTER — Ambulatory Visit: Payer: Self-pay

## 2023-11-06 ENCOUNTER — Ambulatory Visit

## 2023-11-08 ENCOUNTER — Other Ambulatory Visit: Payer: Self-pay

## 2023-11-13 ENCOUNTER — Encounter: Payer: Self-pay | Admitting: Radiology

## 2023-11-29 ENCOUNTER — Other Ambulatory Visit: Payer: Self-pay

## 2023-12-01 ENCOUNTER — Emergency Department (HOSPITAL_BASED_OUTPATIENT_CLINIC_OR_DEPARTMENT_OTHER)

## 2023-12-01 ENCOUNTER — Other Ambulatory Visit: Payer: Self-pay

## 2023-12-01 ENCOUNTER — Encounter (HOSPITAL_BASED_OUTPATIENT_CLINIC_OR_DEPARTMENT_OTHER): Payer: Self-pay

## 2023-12-01 ENCOUNTER — Emergency Department (HOSPITAL_BASED_OUTPATIENT_CLINIC_OR_DEPARTMENT_OTHER)
Admission: EM | Admit: 2023-12-01 | Discharge: 2023-12-01 | Disposition: A | Discharge status: Home / Self Care | Attending: Emergency Medicine | Admitting: Emergency Medicine

## 2023-12-01 DIAGNOSIS — Z91040 Latex allergy status: Secondary | ICD-10-CM | POA: Diagnosis not present

## 2023-12-01 DIAGNOSIS — E119 Type 2 diabetes mellitus without complications: Secondary | ICD-10-CM | POA: Insufficient documentation

## 2023-12-01 DIAGNOSIS — M5442 Lumbago with sciatica, left side: Secondary | ICD-10-CM | POA: Insufficient documentation

## 2023-12-01 DIAGNOSIS — M545 Low back pain, unspecified: Secondary | ICD-10-CM | POA: Diagnosis not present

## 2023-12-01 DIAGNOSIS — M47817 Spondylosis without myelopathy or radiculopathy, lumbosacral region: Secondary | ICD-10-CM | POA: Diagnosis not present

## 2023-12-01 DIAGNOSIS — M4807 Spinal stenosis, lumbosacral region: Secondary | ICD-10-CM | POA: Diagnosis not present

## 2023-12-01 DIAGNOSIS — Y92410 Unspecified street and highway as the place of occurrence of the external cause: Secondary | ICD-10-CM | POA: Insufficient documentation

## 2023-12-01 MED ORDER — PREDNISONE 50 MG PO TABS
60.0000 mg | ORAL_TABLET | Freq: Once | ORAL | Status: AC
  Administered 2023-12-01: 60 mg via ORAL
  Filled 2023-12-01: qty 1

## 2023-12-01 MED ORDER — HYDROCODONE-ACETAMINOPHEN 5-325 MG PO TABS
1.0000 | ORAL_TABLET | Freq: Once | ORAL | Status: AC
  Administered 2023-12-01: 1 via ORAL
  Filled 2023-12-01: qty 1

## 2023-12-01 MED ORDER — METHYLPREDNISOLONE 4 MG PO TBPK: ORAL_TABLET | ORAL | 0 refills | Status: DC

## 2023-12-01 MED ORDER — OXYCODONE-ACETAMINOPHEN 5-325 MG PO TABS: 1.0000 | ORAL_TABLET | Freq: Four times a day (QID) | ORAL | 0 refills | Status: DC | PRN

## 2023-12-01 MED ORDER — LIDOCAINE 5 % EX PTCH: 1.0000 | MEDICATED_PATCH | CUTANEOUS | 0 refills | Status: DC

## 2023-12-01 MED ORDER — LIDOCAINE 5 % EX PTCH
1.0000 | MEDICATED_PATCH | CUTANEOUS | Status: DC
  Administered 2023-12-01: 1 via TRANSDERMAL
  Filled 2023-12-01: qty 1
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
## 2023-12-01 NOTE — Discharge Instructions (Addendum)
 It was a pleasure taking care of you here today.  I have written you for a few medications to help with your back pain.  Prednisone -this is a steroid.  If you are diabetic please note this medication may increase your blood sugars.  Take more frequently.  If blood sugars are greater than 250 stop taking the steroid Percocet-this is an opioid pain medicine.  Please use caution as this medication does have addictive properties.  Do not drive or operate heavy machinery while taking this medication.  Make sure to follow back up with your orthopedist  Return for any worsening symptoms
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
## 2023-12-01 NOTE — ED Notes (Signed)
 Patient transferred from waiting room to ED treatment room. Assuming pt care at this time.

## 2023-12-04 ENCOUNTER — Telehealth: Payer: Self-pay

## 2023-12-04 ENCOUNTER — Encounter: Payer: Self-pay | Admitting: Physical Medicine and Rehabilitation

## 2023-12-04 NOTE — Telephone Encounter (Signed)
 Patient has been scheduled with Megan on 12/15/23 @ 130
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
## 2023-12-05 DIAGNOSIS — Z9189 Other specified personal risk factors, not elsewhere classified: Secondary | ICD-10-CM

## 2023-12-05 DIAGNOSIS — Z803 Family history of malignant neoplasm of breast: Secondary | ICD-10-CM

## 2023-12-05 NOTE — Telephone Encounter (Signed)
 JC patient. Please send orders for breast MRI for increased risk for breast cancer. Thanks.

## 2023-12-06 ENCOUNTER — Other Ambulatory Visit: Payer: Self-pay

## 2023-12-06 ENCOUNTER — Other Ambulatory Visit (HOSPITAL_COMMUNITY): Payer: Self-pay

## 2023-12-15 ENCOUNTER — Other Ambulatory Visit: Payer: Self-pay

## 2023-12-15 ENCOUNTER — Ambulatory Visit: Admitting: Physical Medicine and Rehabilitation

## 2023-12-15 ENCOUNTER — Encounter: Payer: Self-pay | Admitting: Physical Medicine and Rehabilitation

## 2023-12-15 DIAGNOSIS — Z9889 Other specified postprocedural states: Secondary | ICD-10-CM | POA: Diagnosis not present

## 2023-12-15 DIAGNOSIS — M5416 Radiculopathy, lumbar region: Secondary | ICD-10-CM

## 2023-12-15 MED ORDER — METHOCARBAMOL 500 MG PO TABS
500.0000 mg | ORAL_TABLET | Freq: Three times a day (TID) | ORAL | 0 refills | Status: AC
  Filled 2023-12-15: qty 90, 30d supply, fill #0

## 2023-12-15 MED ORDER — OXYCODONE-ACETAMINOPHEN 5-325 MG PO TABS
1.0000 | ORAL_TABLET | Freq: Three times a day (TID) | ORAL | 0 refills | Status: AC | PRN
  Filled 2023-12-15: qty 20, 7d supply, fill #0
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
## 2023-12-20 ENCOUNTER — Other Ambulatory Visit: Payer: Self-pay

## 2023-12-20 NOTE — Telephone Encounter (Signed)
 Medication refill for Mounjaro . Patient is requesting a refill and a dosage increase. Patient has been doing good on Mounjaro  and hasn't had any side effects.

## 2023-12-21 ENCOUNTER — Other Ambulatory Visit: Payer: Self-pay

## 2023-12-21 MED ORDER — TIRZEPATIDE 10 MG/0.5ML ~~LOC~~ SOAJ
10.0000 mg | SUBCUTANEOUS | 3 refills | Status: AC
  Filled 2023-12-21 – 2023-12-24 (×2): qty 2, 28d supply, fill #0
  Filled 2024-01-18: qty 2, 28d supply, fill #1
  Filled ????-??-??: fill #2

## 2023-12-21 MED ORDER — TIRZEPATIDE 10 MG/0.5ML ~~LOC~~ SOAJ: 10.0000 mg | SUBCUTANEOUS | 3 refills | Status: DC

## 2023-12-21 NOTE — Addendum Note (Signed)
 Addended by: Cordarius Benning on: 12/21/2023 01:30 PM   Modules accepted: Orders

## 2023-12-21 NOTE — Telephone Encounter (Addendum)
 Rx was sent to the incorrect pharmacy. Rx has been sent to the correct pharmacy. I called [facility] and spoke to Abby the rx has been cancelled.

## 2023-12-22 ENCOUNTER — Encounter: Payer: Self-pay | Admitting: Physical Medicine and Rehabilitation

## 2023-12-25 ENCOUNTER — Other Ambulatory Visit: Payer: Self-pay

## 2023-12-28 ENCOUNTER — Other Ambulatory Visit: Payer: Self-pay

## 2023-12-28 ENCOUNTER — Ambulatory Visit: Admitting: Physical Medicine and Rehabilitation

## 2023-12-28 VITALS — BP 137/81 | HR 63

## 2023-12-28 DIAGNOSIS — M5416 Radiculopathy, lumbar region: Secondary | ICD-10-CM

## 2023-12-28 MED ORDER — METHYLPREDNISOLONE ACETATE 40 MG/ML IJ SUSP
40.0000 mg | Freq: Once | INTRAMUSCULAR | Status: AC
  Administered 2023-12-28: 08:00:00 40 mg

## 2023-12-28 NOTE — Progress Notes (Signed)
 Alicia Obrien - 48 y.o. female MRN [id number]  Date of birth: [birth year]  Office Visit Note: Visit Date: 12/28/2023 PCP: Waymond Cart, MD Referred by: Waymond Cart, MD  Subjective: Chief Complaint  Patient presents with   Lower Back - Pain   HPI:  Alicia Obrien is a 48 y.o. female who comes in today at the request of Duwaine Pouch, FNP for planned Bilateral S1-2 Lumbar Transforaminal epidural steroid injection with fluoroscopic guidance.  The patient has failed conservative care including home exercise, medications, time and activity modification.  This injection will be diagnostic and hopefully therapeutic.  Please see requesting physician notes for further details and justification.   ROS Otherwise per HPI.  Assessment & Plan: Visit Diagnoses:    ICD-10-CM   1. Lumbar radiculopathy  M54.16 XR C-ARM NO REPORT    Epidural Steroid injection    methylPREDNISolone  acetate (DEPO-MEDROL ) injection 40 mg      Plan: No additional findings.   Meds & Orders:  Meds ordered this encounter  Medications   methylPREDNISolone  acetate (DEPO-MEDROL ) injection 40 mg    Orders Placed This Encounter  Procedures   XR C-ARM NO REPORT   Epidural Steroid injection    Follow-up: Return for visit to requesting provider as needed.   Procedures: No procedures performed  Lumbosacral Transforaminal Epidural Steroid Injection - Sub-Pedicular Approach with Fluoroscopic Guidance  Patient: Alicia Obrien      Date of Birth: 08-16-1975 MRN: [id number] PCP: Waymond Cart, MD      Visit Date: 12/28/2023   Universal Protocol:    Date/Time: 12/28/2023  Consent Given By: the patient  Position: PRONE  Additional Comments: Vital signs were monitored before and after the procedure. Patient was prepped and draped in the usual sterile fashion. The correct patient, procedure, and site was verified.   Injection Procedure Details:   Procedure diagnoses: Lumbar radiculopathy  [M54.16]    Meds Administered:  Meds ordered this encounter  Medications   methylPREDNISolone  acetate (DEPO-MEDROL ) injection 40 mg    Laterality: Bilateral  Location/Site: S1, she does have a lumbarized S1 segment.  Needle:3.5 in., 22 ga.  Short bevel or Quincke spinal needle  Needle Placement: Transforaminal  Findings:    -Comments: Excellent flow of contrast along the nerve, nerve root and into the epidural space.  Procedure Details: After squaring off the end-plates to get a true AP view, the C-arm was positioned so that an oblique view of the foramen as noted above was visualized. The target area is just inferior to the nose of the scotty dog or sub pedicular. The soft tissues overlying this structure were infiltrated with 2-3 ml. of 1% Lidocaine  without Epinephrine .  The spinal needle was inserted toward the target using a trajectory view along the fluoroscope beam.  Under AP and lateral visualization, the needle was advanced so it did not puncture dura and was located close the 6 O'Clock position of the pedical in AP tracterory. Biplanar projections were used to confirm position. Aspiration was confirmed to be negative for CSF and/or blood. A 1-2 ml. volume of Isovue-250 was injected and flow of contrast was noted at each level. Radiographs were obtained for documentation purposes.   After attaining the desired flow of contrast documented above, a 0.5 to 1.0 ml test dose of 0.25% Marcaine  was injected into each respective transforaminal space.  The patient was observed for 90 seconds post injection.  After no sensory deficits were reported, and normal lower extremity motor function was noted,  the above injectate was administered so that equal amounts of the injectate were placed at each foramen (level) into the transforaminal epidural space.   Additional Comments:  The patient tolerated the procedure well Dressing: 2 x 2 sterile gauze and Band-Aid    Post-procedure  details: Patient was observed during the procedure. Post-procedure instructions were reviewed.  Patient left the clinic in stable condition.    Clinical History: MRI LUMBAR SPINE WITHOUT CONTRAST   TECHNIQUE: Multiplanar, multisequence MR imaging of the lumbar spine was performed. No intravenous contrast was administered.   COMPARISON:  12/14/2019   FINDINGS: Segmentation:  5 lumbar type vertebrae   Alignment:  Physiologic.   Vertebrae:  No fracture, evidence of discitis, or bone lesion.   Conus medullaris and cauda equina: Conus extends to the L2 level. Conus and cauda equina appear normal.   Paraspinal and other soft tissues: Negative for perispinal mass or inflammation   Disc levels:   L5-S1 disc desiccation and narrowing with central protrusion that contacts but does not compress the descending S1 nerve roots. A discrete herniation that was left paracentral on prior has regressed. Facet spurring on both sides at this level with mild bilateral foraminal narrowing based on axial images. Borderline facet spurring at L4-5.   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 No progression since 2021, rather left subarticular recess patency is improved after regression of previously seen herniation.     Electronically Signed   By: Dorn Roulette M.D.   On: 11/16/2021 19:11     Objective:  VS:  HT:    WT:   BMI:     BP:137/81  HR:63bpm  TEMP: ( )  RESP:  Physical Exam Vitals and nursing note reviewed.  Constitutional:      General: She is not in acute distress.    Appearance: Normal appearance. She is not ill-appearing.  HENT:     Head: Normocephalic and atraumatic.     Right Ear: External ear normal.     Left Ear: External ear normal.  Eyes:     Extraocular Movements: Extraocular movements intact.  Cardiovascular:     Rate and Rhythm: Normal rate.     Pulses: Normal pulses.  Pulmonary:     Effort: Pulmonary effort is normal. No respiratory distress.   Abdominal:     General: There is no distension.     Palpations: Abdomen is soft.  Musculoskeletal:        General: Tenderness present.     Cervical back: Neck supple.     Right lower leg: No edema.     Left lower leg: No edema.     Comments: Patient has good distal strength with no pain over the greater trochanters.  No clonus or focal weakness.  Skin:    Findings: No erythema, lesion or rash.  Neurological:     General: No focal deficit present.     Mental Status: She is alert and oriented to person, place, and time.     Sensory: No sensory deficit.     Motor: No weakness or abnormal muscle tone.     Coordination: Coordination normal.  Psychiatric:        Mood and Affect: Mood normal.        Behavior: Behavior normal.      Imaging: No results found.

## 2023-12-28 NOTE — Progress Notes (Signed)
 Pain Scale   Average Pain 9 Patient advising she has lower back pain radiating to bilateral legs. Pain is constant.        +Driver, -BT, -Dye Allergies.

## 2023-12-28 NOTE — Procedures (Signed)
 Lumbosacral Transforaminal Epidural Steroid Injection - Sub-Pedicular Approach with Fluoroscopic Guidance  Patient: Alicia Obrien      Date of Birth: 1975/05/03 MRN: [id number] PCP: Waymond Cart, MD      Visit Date: 12/28/2023   Universal Protocol:    Date/Time: 12/28/2023  Consent Given By: the patient  Position: PRONE  Additional Comments: Vital signs were monitored before and after the procedure. Patient was prepped and draped in the usual sterile fashion. The correct patient, procedure, and site was verified.   Injection Procedure Details:   Procedure diagnoses: Lumbar radiculopathy [M54.16]    Meds Administered:  Meds ordered this encounter  Medications   methylPREDNISolone  acetate (DEPO-MEDROL ) injection 40 mg    Laterality: Bilateral  Location/Site: S1, she does have a lumbarized S1 segment.  Needle:3.5 in., 22 ga.  Short bevel or Quincke spinal needle  Needle Placement: Transforaminal  Findings:    -Comments: Excellent flow of contrast along the nerve, nerve root and into the epidural space.  Procedure Details: After squaring off the end-plates to get a true AP view, the C-arm was positioned so that an oblique view of the foramen as noted above was visualized. The target area is just inferior to the nose of the scotty dog or sub pedicular. The soft tissues overlying this structure were infiltrated with 2-3 ml. of 1% Lidocaine  without Epinephrine .  The spinal needle was inserted toward the target using a trajectory view along the fluoroscope beam.  Under AP and lateral visualization, the needle was advanced so it did not puncture dura and was located close the 6 O'Clock position of the pedical in AP tracterory. Biplanar projections were used to confirm position. Aspiration was confirmed to be negative for CSF and/or blood. A 1-2 ml. volume of Isovue-250 was injected and flow of contrast was noted at each level. Radiographs were obtained for documentation  purposes.   After attaining the desired flow of contrast documented above, a 0.5 to 1.0 ml test dose of 0.25% Marcaine  was injected into each respective transforaminal space.  The patient was observed for 90 seconds post injection.  After no sensory deficits were reported, and normal lower extremity motor function was noted,   the above injectate was administered so that equal amounts of the injectate were placed at each foramen (level) into the transforaminal epidural space.   Additional Comments:  The patient tolerated the procedure well Dressing: 2 x 2 sterile gauze and Band-Aid    Post-procedure details: Patient was observed during the procedure. Post-procedure instructions were reviewed.  Patient left the clinic in stable condition.

## 2023-12-29 ENCOUNTER — Other Ambulatory Visit

## 2024-01-03 ENCOUNTER — Other Ambulatory Visit: Payer: Self-pay

## 2024-01-08 ENCOUNTER — Other Ambulatory Visit

## 2024-01-19 ENCOUNTER — Ambulatory Visit: Payer: Self-pay | Admitting: Radiology

## 2024-01-19 ENCOUNTER — Ambulatory Visit
Admission: RE | Admit: 2024-01-19 | Discharge: 2024-01-19 | Disposition: A | Source: Ambulatory Visit | Attending: Radiology | Admitting: Radiology

## 2024-01-19 DIAGNOSIS — Z9189 Other specified personal risk factors, not elsewhere classified: Secondary | ICD-10-CM

## 2024-01-19 DIAGNOSIS — Z803 Family history of malignant neoplasm of breast: Secondary | ICD-10-CM

## 2024-01-19 MED ORDER — GADOPICLENOL 0.5 MMOL/ML IV SOLN
9.0000 mL | Freq: Once | INTRAVENOUS | Status: AC | PRN
  Administered 2024-01-19: 9 mL via INTRAVENOUS

## 2024-01-25 ENCOUNTER — Other Ambulatory Visit: Payer: Self-pay

## 2024-02-09 ENCOUNTER — Other Ambulatory Visit: Payer: Self-pay | Admitting: Radiology

## 2024-02-09 DIAGNOSIS — N951 Menopausal and female climacteric states: Secondary | ICD-10-CM

## 2024-02-09 MED ORDER — ESTRADIOL 0.05 MG/24HR TD PTWK: 0.0500 mg | MEDICATED_PATCH | TRANSDERMAL | 1 refills | Status: AC

## 2024-02-09 MED ORDER — ESTRADIOL 0.05 MG/24HR TD PTTW: 1.0000 | MEDICATED_PATCH | TRANSDERMAL | 6 refills | Status: DC
